# Patient Record
Sex: Female | Born: 1955 | Hispanic: No | Smoking: Current some day smoker
Health system: Southern US, Community
[De-identification: ages and names within clinical notes are randomized; demographics above are authoritative.]

## PROBLEM LIST (undated history)

## (undated) DIAGNOSIS — R002 Palpitations: Secondary | ICD-10-CM

## (undated) DIAGNOSIS — F32A Depression, unspecified: Secondary | ICD-10-CM

## (undated) DIAGNOSIS — F419 Anxiety disorder, unspecified: Secondary | ICD-10-CM

## (undated) DIAGNOSIS — F329 Major depressive disorder, single episode, unspecified: Secondary | ICD-10-CM

## (undated) DIAGNOSIS — I499 Cardiac arrhythmia, unspecified: Secondary | ICD-10-CM

## (undated) DIAGNOSIS — R296 Repeated falls: Secondary | ICD-10-CM

## (undated) DIAGNOSIS — G459 Transient cerebral ischemic attack, unspecified: Secondary | ICD-10-CM

## (undated) DIAGNOSIS — M1990 Unspecified osteoarthritis, unspecified site: Secondary | ICD-10-CM

## (undated) DIAGNOSIS — B029 Zoster without complications: Secondary | ICD-10-CM

## (undated) DIAGNOSIS — E039 Hypothyroidism, unspecified: Secondary | ICD-10-CM

## (undated) HISTORY — PX: CHOLECYSTECTOMY: SHX55

## (undated) HISTORY — PX: ABDOMINAL HYSTERECTOMY: SHX81

## (undated) HISTORY — DX: Cardiac arrhythmia, unspecified: I49.9

## (undated) HISTORY — DX: Transient cerebral ischemic attack, unspecified: G45.9

## (undated) HISTORY — DX: Repeated falls: R29.6

---

## 2016-07-09 ENCOUNTER — Ambulatory Visit: Payer: Self-pay | Admitting: Family

## 2016-07-10 ENCOUNTER — Ambulatory Visit: Payer: Self-pay | Admitting: Family

## 2016-07-17 ENCOUNTER — Ambulatory Visit: Payer: Self-pay | Admitting: Family

## 2016-07-17 DIAGNOSIS — Z0289 Encounter for other administrative examinations: Secondary | ICD-10-CM

## 2016-09-05 ENCOUNTER — Encounter: Payer: Self-pay | Admitting: *Deleted

## 2016-09-05 ENCOUNTER — Emergency Department: Payer: Medicare (Managed Care)

## 2016-09-05 ENCOUNTER — Emergency Department
Admission: EM | Admit: 2016-09-05 | Discharge: 2016-09-05 | Disposition: A | Discharge status: Home / Self Care | Payer: Medicare (Managed Care) | Attending: Emergency Medicine | Admitting: Emergency Medicine

## 2016-09-05 DIAGNOSIS — F17200 Nicotine dependence, unspecified, uncomplicated: Secondary | ICD-10-CM | POA: Insufficient documentation

## 2016-09-05 DIAGNOSIS — Y9389 Activity, other specified: Secondary | ICD-10-CM | POA: Insufficient documentation

## 2016-09-05 DIAGNOSIS — S0990XA Unspecified injury of head, initial encounter: Secondary | ICD-10-CM | POA: Diagnosis present

## 2016-09-05 DIAGNOSIS — Y92009 Unspecified place in unspecified non-institutional (private) residence as the place of occurrence of the external cause: Secondary | ICD-10-CM | POA: Insufficient documentation

## 2016-09-05 DIAGNOSIS — S300XXA Contusion of lower back and pelvis, initial encounter: Secondary | ICD-10-CM

## 2016-09-05 DIAGNOSIS — S060X1A Concussion with loss of consciousness of 30 minutes or less, initial encounter: Secondary | ICD-10-CM | POA: Diagnosis not present

## 2016-09-05 DIAGNOSIS — W108XXA Fall (on) (from) other stairs and steps, initial encounter: Secondary | ICD-10-CM | POA: Insufficient documentation

## 2016-09-05 DIAGNOSIS — W19XXXA Unspecified fall, initial encounter: Secondary | ICD-10-CM

## 2016-09-05 DIAGNOSIS — Y999 Unspecified external cause status: Secondary | ICD-10-CM | POA: Insufficient documentation

## 2016-09-05 DIAGNOSIS — S5001XA Contusion of right elbow, initial encounter: Secondary | ICD-10-CM

## 2016-09-05 HISTORY — DX: Anxiety disorder, unspecified: F41.9

## 2016-09-05 HISTORY — DX: Palpitations: R00.2

## 2016-09-05 MED ORDER — DIPHENHYDRAMINE HCL 50 MG/ML IJ SOLN
25.0000 mg | Freq: Once | INTRAMUSCULAR | Status: AC
  Administered 2016-09-05: 25 mg via INTRAMUSCULAR
  Filled 2016-09-05: qty 1

## 2016-09-05 MED ORDER — PROMETHAZINE HCL 25 MG/ML IJ SOLN
12.5000 mg | Freq: Once | INTRAMUSCULAR | Status: AC
  Administered 2016-09-05: 12.5 mg via INTRAMUSCULAR
  Filled 2016-09-05: qty 1

## 2016-09-05 MED ORDER — PREDNISONE 20 MG PO TABS
60.0000 mg | ORAL_TABLET | Freq: Once | ORAL | Status: AC
  Administered 2016-09-05: 60 mg via ORAL
  Filled 2016-09-05: qty 3

## 2016-09-05 NOTE — ED Triage Notes (Addendum)
Pt states she had slippery socks on and slipped today and hit her head, states headache and back pain, denies any LOC, denies any use of blood thinners, pt very anxious in triage states hx of anxiety,

## 2016-09-05 NOTE — ED Provider Notes (Signed)
[facility] Emergency Department Provider Note  ____________________________________________  Time seen: Approximately 8:21 PM  I have reviewed the triage vital signs and the nursing notes.   HISTORY  Chief Complaint Fall    HPI Diana West is a 60 y.o. female presents emergency department status post a fall at home. Patient states that she was coming down [place] stairs when she slipped with her socks and fell landing on her lower back and hitting her head. Patient states that she had her head with enough force to temporarily lose consciousness. Patient states that she now has a headache, some mild blurry vision, some mild tinnitus. Patient is also endorsing neck, right elbow, lower back pain. 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 She has chronic neck and back pain. Patient denies any radicular symptoms in any 4 extremity. She denies any bowel or bladder function, saddle anesthesia, paresthesias. Patient reports that pain is sharp in her neck, elbow, back but she is more concerned about her head and her headache. No emesis. No nausea. No medications prior to arrival.   Past Medical History:  Diagnosis Date  . Anxiety   . Palpitation     There are no active problems to display for this patient.   History reviewed. No pertinent surgical history.  Prior to Admission medications   Not on File    Allergies Codeine and Toradol [ketorolac tromethamine]  History reviewed. No pertinent family history.  Social History Social History  Substance Use Topics  . Smoking status: Current Some Day Smoker  . Smokeless tobacco: Not on file  . Alcohol use Not on file     Review of Systems  Constitutional: No fever/chills Eyes: Positive for blurry vision. No discharge ENT: No upper respiratory complaints. Cardiovascular: no chest pain. Respiratory: no cough. No SOB. Gastrointestinal: No abdominal pain.  No  nausea, no vomiting.  No diarrhea.  No constipation. Musculoskeletal: Positive for neck, right elbow, lower back pain. She denies any solid anesthesia, paresthesias, bowel or bladder dysfunction. Skin: Negative for rash, abrasions, lacerations, ecchymosis. Neurological: Positive for headache but denies focal weakness or numbness. 10-point ROS otherwise negative.  ____________________________________________   PHYSICAL EXAM:  VITAL SIGNS: ED Triage Vitals [09/05/16 1756]  Enc Vitals Group     BP 132/89     Pulse Rate 98     Resp 18     Temp 98.8 F (37.1 C)     Temp Source Oral     SpO2 100 %     Weight 130 lb (59 kg)     Height 5\' 7"  (1.702 m)     Head Circumference      Peak Flow      Pain Score 8     Pain Loc      Pain Edu?      Excl. in [place]?      Constitutional: Alert and oriented. Well appearing and in no acute distress. Eyes: Conjunctivae are normal. PERRL. EOMI. Funduscopic exam reveals no acute abnormalities bilaterally. Head: Atraumatic, no evidence of hematoma, abrasion, laceration. Patient is nontender to palpation of the osseous structures of the skull and face. No battle signs. No raccoon eyes. There is serosanguineous fluid drainage from the ears or nares. ENT:      Ears:       Nose: No congestion/rhinnorhea.      Mouth/Throat: Mucous membranes are moist.  Neck: No stridor.  Mild multilevel midline cervical spine tenderness to palpation. No step-off. No  point tenderness.  Cardiovascular: Normal rate, regular rhythm. Normal S1 and S2.  Good peripheral circulation. Respiratory: Normal respiratory effort without tachypnea or retractions. Lungs CTAB. Good air entry to the bases with no decreased or absent breath sounds. Gastrointestinal: Bowel sounds 4 quadrants. Soft and nontender to palpation. No guarding or rigidity. No palpable masses. No distention. Musculoskeletal: Full range of motion to all extremities. No gross deformities appreciated. No deformities or  gross edema noted to right elbow upon inspection. Mild ecchymosis noted to the posterior elbow. Patient is nontender to palpation over the posterior elbow but no specific point tenderness. No palpable abnormality. Full range of motion to elbow. Radial pulse and sensation intact distally. No visible deformities or ecchymosis noted to lumbar spine upon inspection. Patient is diffusely tender palpation midline spinal processes. No step-off. No palpable bladder moderate. Patient is nontender to palpation bilateral sciatic notches. Dorsalis pedis pulse intact bilateral lower extremities. Sensation intact and equal lower extremities. Neurologic:  Normal speech and language. No gross focal neurologic deficits are appreciated. Radial nerves II through XII are grossly intact. Skin:  Skin is warm, dry and intact. No rash noted. Psychiatric: Mood and affect are normal. She does appear anxious. Speech and behavior are normal. Patient exhibits appropriate insight and judgement.   ____________________________________________   LABS (all labs ordered are listed, but only abnormal results are displayed)  Labs Reviewed - No data to display ____________________________________________  EKG   ____________________________________________  RADIOLOGY Diamantina [facility] Genora Arp, personally viewed and evaluated these images (plain radiographs) as part of my medical decision making, as well as reviewing the written report by the radiologist.  Dg Lumbar Spine Complete  Result Date: 09/05/2016 CLINICAL DATA:  Acute lumbar spine pain following fall today. Initial encounter. EXAM: LUMBAR SPINE - COMPLETE 4+ VIEW COMPARISON:  None. FINDINGS: There is no evidence of acute fracture or subluxation. Multilevel mild degenerative disc disease and mild to moderate facet arthropathy noted. No focal bony lesions or spondylolysis identified. Abdominal aortic atherosclerotic calcifications are present. IMPRESSION: No evidence of acute  abnormality. Multilevel degenerative changes as described. Abdominal aortic atherosclerosis. Electronically Signed   By: Margarette Canada M.D.   On: 09/05/2016 19:36   Dg Elbow Complete Right  Result Date: 09/05/2016 CLINICAL DATA:  Acute right elbow pain following fall today. Initial encounter. EXAM: RIGHT ELBOW - COMPLETE 3+ VIEW COMPARISON:  None. FINDINGS: There is no evidence of fracture, dislocation, or joint effusion. There is no evidence of arthropathy or other focal bone abnormality. Soft tissues are unremarkable. IMPRESSION: Negative. Electronically Signed   By: Margarette Canada M.D.   On: 09/05/2016 19:34   Ct Head Wo Contrast  Result Date: 09/05/2016 CLINICAL DATA:  Mechanical fall with posterior head injury. Headache and blurry vision. Neck pain. Initial encounter. EXAM: CT HEAD WITHOUT CONTRAST CT CERVICAL SPINE WITHOUT CONTRAST TECHNIQUE: Multidetector CT imaging of the head and cervical spine was performed following the standard protocol without intravenous contrast. Multiplanar CT image reconstructions of the cervical spine were also generated. COMPARISON:  None. FINDINGS: CT HEAD FINDINGS Brain: No evidence of acute infarction, hemorrhage, hydrocephalus, or mass. Mild prominence of extra-axial spaces without discrete collection or definite mass-effect to suggest hygroma. Vascular: Atherosclerotic calcification. Skull: Negative for fracture Sinuses/Orbits: No acute finding CT CERVICAL SPINE FINDINGS Alignment: Reversal cervical lordosis from degenerative changes. Degenerative appearing C5-6 retrolisthesis. Facet mediated C7-T1 anterolisthesis. Skull base and vertebrae: Negative for acute fracture. Soft tissues and spinal canal: No gross canal hematoma or prevertebral edema. There is fatty atrophy  of both parotid glands with numerous calcifications. Atrophic thyroid. Disc levels: Degenerative changes with narrowing and endplate spurring greatest at C4-5 and C5-6. Generalized facet arthropathy with  bulky upper cervical facet spurring and C3-4 ankylosis. Generalized spinal stenosis with cord compression at C3-4 and C4-5 due to disc protrusions. Upper chest: No acute finding IMPRESSION: 1. No evidence of acute intracranial or cervical spine fracture. 2. Cervical spine degeneration with spinal stenosis and cord flattening at C4-5. Electronically Signed   By: Monte Fantasia M.D.   On: 09/05/2016 19:18   Ct Cervical Spine Wo Contrast  Result Date: 09/05/2016 CLINICAL DATA:  Mechanical fall with posterior head injury. Headache and blurry vision. Neck pain. Initial encounter. EXAM: CT HEAD WITHOUT CONTRAST CT CERVICAL SPINE WITHOUT CONTRAST TECHNIQUE: Multidetector CT imaging of the head and cervical spine was performed following the standard protocol without intravenous contrast. Multiplanar CT image reconstructions of the cervical spine were also generated. COMPARISON:  None. FINDINGS: CT HEAD FINDINGS Brain: No evidence of acute infarction, hemorrhage, hydrocephalus, or mass. Mild prominence of extra-axial spaces without discrete collection or definite mass-effect to suggest hygroma. Vascular: Atherosclerotic calcification. Skull: Negative for fracture Sinuses/Orbits: No acute finding CT CERVICAL SPINE FINDINGS Alignment: Reversal cervical lordosis from degenerative changes. Degenerative appearing C5-6 retrolisthesis. Facet mediated C7-T1 anterolisthesis. Skull base and vertebrae: Negative for acute fracture. Soft tissues and spinal canal: No gross canal hematoma or prevertebral edema. There is fatty atrophy of both parotid glands with numerous calcifications. Atrophic thyroid. Disc levels: Degenerative changes with narrowing and endplate spurring greatest at C4-5 and C5-6. Generalized facet arthropathy with bulky upper cervical facet spurring and C3-4 ankylosis. Generalized spinal stenosis with cord compression at C3-4 and C4-5 due to disc protrusions. Upper chest: No acute finding IMPRESSION: 1. No  evidence of acute intracranial or cervical spine fracture. 2. Cervical spine degeneration with spinal stenosis and cord flattening at C4-5. Electronically Signed   By: Monte Fantasia M.D.   On: 09/05/2016 19:18    ____________________________________________    PROCEDURES  Procedure(s) performed:    Procedures    Medications  promethazine (PHENERGAN) injection 12.5 mg (12.5 mg Intramuscular Given 09/05/16 2020)  predniSONE (DELTASONE) tablet 60 mg (60 mg Oral Given 09/05/16 2019)  diphenhydrAMINE (BENADRYL) injection 25 mg (25 mg Intramuscular Given 09/05/16 2019)     ____________________________________________   INITIAL IMPRESSION / ASSESSMENT AND PLAN / ED COURSE  Pertinent labs & imaging results that were available during my care of the patient were reviewed by me and considered in my medical decision making (see chart for details).  Review of the [place] CSRS was performed in accordance of the [place] prior to dispensing any controlled drugs.  Clinical Course     Patient's diagnosis is consistent with Fall resulting in mild head injury and concussion symptoms with mild memory loss, loss consciousness, continue headache, mild blurred vision. Cranial nerves are still intact. No indication for further imaging. CT scan does reveal cervical stenosis the patient denies any radicular symptoms. Patient reports having diffuse degenerative changes throughout spine. X-rays of the elbow and lumbar spine are reassuring for no acute abnormality. Patient's exam is reassuring at this time and patient will be discharged. She is given migraine cocktail medications with the exception of prednisone substituted for NSAID injection.. Patient is to take Tylenol and Motrin at home for any pain complaints. Patient is to follow up with primary care as needed or otherwise directed. Patient is given ED precautions to return to the ED for any worsening or new  symptoms.     ____________________________________________  FINAL CLINICAL IMPRESSION(S) / ED DIAGNOSES  Final diagnoses:  Fall, initial encounter  Minor head injury, initial encounter  Lumbar contusion, initial encounter  Contusion of right elbow, initial encounter  Concussion with loss of consciousness of 30 minutes or less, initial encounter      NEW MEDICATIONS STARTED DURING THIS VISIT:  There are no discharge medications for this patient.       This chart was dictated using voice recognition software/Dragon. Despite best efforts to proofread, errors can occur which can change the meaning. Any change was purely unintentional.    Darletta Moll, PA-C 09/05/16 2106    Hinda Kehr, MD 09/05/16 2122

## 2016-09-05 NOTE — ED Notes (Signed)
Reviewed d/c instructions, follow-up care, and OTC pain meds with pt. Pt verbalized understanding

## 2016-09-05 NOTE — ED Notes (Signed)
Pt fell down 4 steps inside home today.  States sock slipped on stairs.  Pt states a gate fell on her.   Pt has a headache.  No loc.  No vomiting. Pt also has lower back pain and neck pain.  Pt has abrasions to arms.  Pt alert.  Speech clear.  Pt also has ringing in ears.

## 2017-05-02 ENCOUNTER — Emergency Department: Payer: Medicare (Managed Care)

## 2017-05-02 ENCOUNTER — Emergency Department
Admission: EM | Admit: 2017-05-02 | Discharge: 2017-05-02 | Disposition: A | Discharge status: Home / Self Care | Payer: Medicare (Managed Care) | Attending: Emergency Medicine | Admitting: Emergency Medicine

## 2017-05-02 ENCOUNTER — Encounter: Payer: Self-pay | Admitting: Emergency Medicine

## 2017-05-02 DIAGNOSIS — W19XXXA Unspecified fall, initial encounter: Secondary | ICD-10-CM

## 2017-05-02 DIAGNOSIS — Y9301 Activity, walking, marching and hiking: Secondary | ICD-10-CM | POA: Diagnosis not present

## 2017-05-02 DIAGNOSIS — W010XXA Fall on same level from slipping, tripping and stumbling without subsequent striking against object, initial encounter: Secondary | ICD-10-CM | POA: Diagnosis not present

## 2017-05-02 DIAGNOSIS — Y999 Unspecified external cause status: Secondary | ICD-10-CM | POA: Diagnosis not present

## 2017-05-02 DIAGNOSIS — F17200 Nicotine dependence, unspecified, uncomplicated: Secondary | ICD-10-CM | POA: Insufficient documentation

## 2017-05-02 DIAGNOSIS — S0990XA Unspecified injury of head, initial encounter: Secondary | ICD-10-CM | POA: Diagnosis present

## 2017-05-02 DIAGNOSIS — Y92038 Other place in apartment as the place of occurrence of the external cause: Secondary | ICD-10-CM | POA: Diagnosis not present

## 2017-05-02 DIAGNOSIS — S0083XA Contusion of other part of head, initial encounter: Secondary | ICD-10-CM

## 2017-05-02 DIAGNOSIS — S0011XA Contusion of right eyelid and periocular area, initial encounter: Secondary | ICD-10-CM | POA: Diagnosis not present

## 2017-05-02 MED ORDER — OXYCODONE-ACETAMINOPHEN 5-325 MG PO TABS: 1.0000 | ORAL_TABLET | Freq: Four times a day (QID) | ORAL | 0 refills | Status: DC | PRN

## 2017-05-02 MED ORDER — FENTANYL CITRATE (PF) 100 MCG/2ML IJ SOLN
50.0000 ug | Freq: Once | INTRAMUSCULAR | Status: AC
Start: 2017-05-02 — End: 2017-05-02
  Administered 2017-05-02: 50 ug via INTRAVENOUS

## 2017-05-02 MED ORDER — HYDROMORPHONE HCL 1 MG/ML IJ SOLN
1.0000 mg | Freq: Once | INTRAMUSCULAR | Status: AC
  Administered 2017-05-02: 1 mg via INTRAVENOUS
  Filled 2017-05-02: qty 1

## 2017-05-02 MED ORDER — LORAZEPAM 2 MG/ML IJ SOLN
0.5000 mg | Freq: Once | INTRAMUSCULAR | Status: AC
  Administered 2017-05-02: 0.5 mg via INTRAVENOUS
  Filled 2017-05-02: qty 1

## 2017-05-02 MED ORDER — FENTANYL CITRATE (PF) 100 MCG/2ML IJ SOLN
INTRAMUSCULAR | Status: DC
Start: 2017-05-02 — End: 2017-05-02
  Filled 2017-05-02: qty 2

## 2017-05-02 MED ORDER — FENTANYL CITRATE (PF) 100 MCG/2ML IJ SOLN: 50.0000 ug | Freq: Once | INTRAMUSCULAR | Status: DC

## 2017-05-02 NOTE — ED Notes (Signed)
Pt verbalized understanding of discharge instructions. NAD at this time. 

## 2017-05-02 NOTE — Discharge Instructions (Signed)
No results found for this or any previous visit. Ct Head Wo Contrast  Result Date: 05/02/2017 CLINICAL DATA:  Recent fall EXAM: CT HEAD WITHOUT CONTRAST CT MAXILLOFACIAL WITHOUT CONTRAST TECHNIQUE: Multidetector CT imaging of the head and maxillofacial structures were performed using the standard protocol without intravenous contrast. Multiplanar CT image reconstructions of the maxillofacial structures were also generated. COMPARISON:  None. FINDINGS: CT HEAD FINDINGS Brain: Mild atrophic changes are noted. No findings to suggest acute hemorrhage, acute infarction or space-occupying mass lesion are noted. Vascular: No hyperdense vessel or unexpected calcification. Skull: Normal. Negative for fracture or focal lesion. Other: Soft tissue swelling is noted over the right orbit with focal soft tissue hematoma. CT MAXILLOFACIAL FINDINGS Osseous: No acute bony abnormality is noted. Orbits: The orbits and their contents are within normal limits. A soft tissue hematoma is noted on the right involving the supraorbital region and right eyelid. Sinuses: Clear. Soft tissues: As described above. No other soft tissue abnormality is noted. IMPRESSION: CT of the head: Mild atrophic changes without acute abnormality. CT of the maxillofacial bones: No acute fracture is seen. Right periorbital soft tissue changes are noted as described. Electronically Signed   By: Inez Catalina M.D.   On: 05/02/2017 17:23   Ct Maxillofacial Wo Contrast  Result Date: 05/02/2017 CLINICAL DATA:  Recent fall EXAM: CT HEAD WITHOUT CONTRAST CT MAXILLOFACIAL WITHOUT CONTRAST TECHNIQUE: Multidetector CT imaging of the head and maxillofacial structures were performed using the standard protocol without intravenous contrast. Multiplanar CT image reconstructions of the maxillofacial structures were also generated. COMPARISON:  None. FINDINGS: CT HEAD FINDINGS Brain: Mild atrophic changes are noted. No findings to suggest acute hemorrhage, acute infarction  or space-occupying mass lesion are noted. Vascular: No hyperdense vessel or unexpected calcification. Skull: Normal. Negative for fracture or focal lesion. Other: Soft tissue swelling is noted over the right orbit with focal soft tissue hematoma. CT MAXILLOFACIAL FINDINGS Osseous: No acute bony abnormality is noted. Orbits: The orbits and their contents are within normal limits. A soft tissue hematoma is noted on the right involving the supraorbital region and right eyelid. Sinuses: Clear. Soft tissues: As described above. No other soft tissue abnormality is noted. IMPRESSION: CT of the head: Mild atrophic changes without acute abnormality. CT of the maxillofacial bones: No acute fracture is seen. Right periorbital soft tissue changes are noted as described. Electronically Signed   By: Inez Catalina M.D.   On: 05/02/2017 17:23

## 2017-05-02 NOTE — ED Provider Notes (Addendum)
[facility] Emergency Department Provider Note  ____________________________________________  Time seen: Approximately 6:12 PM  I have reviewed the triage vital signs and the nursing notes.   HISTORY  Chief Complaint Fall    HPI Diana West is a 61 y.o. female who complains of pain on the right forehead after a fall. She just got down to her apartment building's office to renew her lease, and after leaving there she had a brief episode of dizziness which caused her to lose her balance and fell forward striking her face. No loss of consciousness or neck pain. No vision changes. She reports the dizziness it happens frequently for her and is not abnormal and she otherwise feels like she is in her usual state of health. Eating and drinking normally, no recent illness. No vomiting or diarrhea.Denies neck pain paresthesia or weakness.     Past Medical History:  Diagnosis Date  . Anxiety   . Palpitation      There are no active problems to display for this patient.    History reviewed. No pertinent surgical history.   Prior to Admission medications   Not on File  Gabapentin   Allergies Codeine and Toradol [ketorolac tromethamine]   History reviewed. No pertinent family history.  Social History Social History  Substance Use Topics  . Smoking status: Current Some Day Smoker  . Smokeless tobacco: Never Used  . Alcohol use No    Review of Systems  Constitutional:   No fever or chills.  ENT:   No sore throat. No rhinorrhea. Cardiovascular:   No chest pain or syncope. Respiratory:   No dyspnea or cough. Gastrointestinal:   Negative for abdominal pain, vomiting and diarrhea.  Musculoskeletal:   Negative for focal pain or swelling All other systems reviewed and are negative except as documented above in ROS and HPI.  ____________________________________________   PHYSICAL EXAM:  VITAL SIGNS: ED Triage Vitals  Enc Vitals Group     BP  05/02/17 1627 (!) 104/57     Pulse Rate 05/02/17 1627 95     Resp 05/02/17 1627 19     Temp --      Temp src --      SpO2 05/02/17 1627 98 %     Weight 05/02/17 1629 125 lb (56.7 kg)     Height 05/02/17 1629 5\' 7"  (1.702 m)     Head Circumference --      Peak Flow --      Pain Score 05/02/17 1629 10     Pain Loc --      Pain Edu? --      Excl. in [place]? --     Vital signs reviewed, nursing assessments reviewed.   Constitutional:   Alert and oriented. Well appearing and in no distress. Eyes:   No scleral icterus.  EOMI. No nystagmus. No conjunctival pallor. PERRL. ENT   Head:   Normocephalic With focal hematoma over the right lateral supraorbital ridge. No laceration or bleeding. No hemotympanum.   Nose:   No congestion/rhinnorhea. No epistaxis   Mouth/Throat:   MMM, no pharyngeal erythema. No peritonsillar mass.    Neck:   No meningismus. Full ROM. No midline tenderness. Hematological/Lymphatic/Immunilogical:   No cervical lymphadenopathy. Cardiovascular:   RRR. Symmetric bilateral radial and DP pulses.  No murmurs.  Respiratory:   Normal respiratory effort without tachypnea/retractions. Breath sounds are clear and equal bilaterally. No wheezes/rales/rhonchi. Gastrointestinal:   Soft and nontender. Non distended. There is no CVA tenderness.  No rebound, rigidity, or guarding. Genitourinary:   deferred Musculoskeletal:   Normal range of motion in all extremities. No joint effusions.  No lower extremity tenderness.  No edema. No midline spinal tenderness. Pelvis stable. No clavicular or shoulder pain or deformity Neurologic:   Normal speech and language.  Motor grossly intact. No gross focal neurologic deficits are appreciated.  Skin:    Skin is warm, dry and intact. No rash noted.  No petechiae, purpura, or bullae.  ____________________________________________    LABS (pertinent positives/negatives) (all labs ordered are listed, but only abnormal results are  displayed) Labs Reviewed - No data to display ____________________________________________   EKG  Interpreted by me Sinus rhythm rate of 90, normal axis intervals QRS ST segments and T waves.  ____________________________________________    RADIOLOGY  Ct Head Wo Contrast  Result Date: 05/02/2017 CLINICAL DATA:  Recent fall EXAM: CT HEAD WITHOUT CONTRAST CT MAXILLOFACIAL WITHOUT CONTRAST TECHNIQUE: Multidetector CT imaging of the head and maxillofacial structures were performed using the standard protocol without intravenous contrast. Multiplanar CT image reconstructions of the maxillofacial structures were also generated. COMPARISON:  None. FINDINGS: CT HEAD FINDINGS Brain: Mild atrophic changes are noted. No findings to suggest acute hemorrhage, acute infarction or space-occupying mass lesion are noted. Vascular: No hyperdense vessel or unexpected calcification. Skull: Normal. Negative for fracture or focal lesion. Other: Soft tissue swelling is noted over the right orbit with focal soft tissue hematoma. CT MAXILLOFACIAL FINDINGS Osseous: No acute bony abnormality is noted. Orbits: The orbits and their contents are within normal limits. A soft tissue hematoma is noted on the right involving the supraorbital region and right eyelid. Sinuses: Clear. Soft tissues: As described above. No other soft tissue abnormality is noted. IMPRESSION: CT of the head: Mild atrophic changes without acute abnormality. CT of the maxillofacial bones: No acute fracture is seen. Right periorbital soft tissue changes are noted as described. Electronically Signed   By: Inez Catalina M.D.   On: 05/02/2017 17:23   Ct Maxillofacial Wo Contrast  Result Date: 05/02/2017 CLINICAL DATA:  Recent fall EXAM: CT HEAD WITHOUT CONTRAST CT MAXILLOFACIAL WITHOUT CONTRAST TECHNIQUE: Multidetector CT imaging of the head and maxillofacial structures were performed using the standard protocol without intravenous contrast. Multiplanar CT  image reconstructions of the maxillofacial structures were also generated. COMPARISON:  None. FINDINGS: CT HEAD FINDINGS Brain: Mild atrophic changes are noted. No findings to suggest acute hemorrhage, acute infarction or space-occupying mass lesion are noted. Vascular: No hyperdense vessel or unexpected calcification. Skull: Normal. Negative for fracture or focal lesion. Other: Soft tissue swelling is noted over the right orbit with focal soft tissue hematoma. CT MAXILLOFACIAL FINDINGS Osseous: No acute bony abnormality is noted. Orbits: The orbits and their contents are within normal limits. A soft tissue hematoma is noted on the right involving the supraorbital region and right eyelid. Sinuses: Clear. Soft tissues: As described above. No other soft tissue abnormality is noted. IMPRESSION: CT of the head: Mild atrophic changes without acute abnormality. CT of the maxillofacial bones: No acute fracture is seen. Right periorbital soft tissue changes are noted as described. Electronically Signed   By: Inez Catalina M.D.   On: 05/02/2017 17:23    ____________________________________________   PROCEDURES Procedures  ____________________________________________   INITIAL IMPRESSION / ASSESSMENT AND PLAN / ED COURSE  Pertinent labs & imaging results that were available during my care of the patient were reviewed by me and considered in my medical decision making (see chart for details).  Patient well  appearing no acute distress, presents with hematoma over the right eye after a brief episode of dizziness causing a fall. The patient states that these episodes are typical for her and happen frequently. She is otherwise in her usual state of health, eating and drinking normally, without any other acute pain syndrome or other complaints. She otherwise feels back to normal other than the pain at the site of her hematoma. Tetanus is up to date. CT head negative, EKG nondiagnostic, follow-up with primary  care.  Patient refuses NSAIDs, refuses Percocet due to unspecified codeine allergy. Recommend Tylenol.      ----------------------------------------- 7:28 PM on 05/02/2017 -----------------------------------------  On discharge, patient remembers that she has taken Percocet before and is okay with that and does not have adverse effects. We'll give her a limited prescription. She is aware of precautions and to avoid driving or dangerous activities while taking it.  ____________________________________________   FINAL CLINICAL IMPRESSION(S) / ED DIAGNOSES  Final diagnoses:  Fall, initial encounter  Contusion of face, initial encounter      New Prescriptions   No medications on file     Portions of this note were generated with dragon dictation software. Dictation errors may occur despite best attempts at proofreading.    Carrie Mew, MD 05/02/17 1816    Carrie Mew, MD 05/02/17 Kathyrn Drown

## 2017-05-02 NOTE — ED Triage Notes (Signed)
Pt to ED by EMS from home after a fall in which the pt felt dizzy lost her balance and fell forward hitting her face, right shoulder and right leg. Pt has large hematoma over right eye.

## 2017-05-02 NOTE — ED Notes (Signed)

## 2017-05-05 ENCOUNTER — Emergency Department: Payer: Medicare (Managed Care)

## 2017-05-05 ENCOUNTER — Encounter: Payer: Self-pay | Admitting: Emergency Medicine

## 2017-05-05 ENCOUNTER — Emergency Department
Admission: EM | Admit: 2017-05-05 | Discharge: 2017-05-05 | Disposition: A | Discharge status: Home / Self Care | Payer: Medicare (Managed Care) | Attending: Emergency Medicine | Admitting: Emergency Medicine

## 2017-05-05 DIAGNOSIS — Y92000 Kitchen of unspecified non-institutional (private) residence as  the place of occurrence of the external cause: Secondary | ICD-10-CM | POA: Diagnosis not present

## 2017-05-05 DIAGNOSIS — Y999 Unspecified external cause status: Secondary | ICD-10-CM | POA: Diagnosis not present

## 2017-05-05 DIAGNOSIS — M542 Cervicalgia: Secondary | ICD-10-CM | POA: Diagnosis not present

## 2017-05-05 DIAGNOSIS — R42 Dizziness and giddiness: Secondary | ICD-10-CM | POA: Diagnosis present

## 2017-05-05 DIAGNOSIS — W19XXXA Unspecified fall, initial encounter: Secondary | ICD-10-CM

## 2017-05-05 DIAGNOSIS — Y939 Activity, unspecified: Secondary | ICD-10-CM | POA: Diagnosis not present

## 2017-05-05 DIAGNOSIS — S40011A Contusion of right shoulder, initial encounter: Secondary | ICD-10-CM | POA: Insufficient documentation

## 2017-05-05 DIAGNOSIS — W010XXA Fall on same level from slipping, tripping and stumbling without subsequent striking against object, initial encounter: Secondary | ICD-10-CM | POA: Diagnosis not present

## 2017-05-05 DIAGNOSIS — M545 Low back pain, unspecified: Secondary | ICD-10-CM

## 2017-05-05 DIAGNOSIS — E039 Hypothyroidism, unspecified: Secondary | ICD-10-CM | POA: Diagnosis not present

## 2017-05-05 LAB — COMPREHENSIVE METABOLIC PANEL
ALT: 11 U/L — AB (ref 14–54)
ANION GAP: 5 (ref 5–15)
AST: 19 U/L (ref 15–41)
Albumin: 4 g/dL (ref 3.5–5.0)
Alkaline Phosphatase: 87 U/L (ref 38–126)
BUN: 22 mg/dL — ABNORMAL HIGH (ref 6–20)
CALCIUM: 9 mg/dL (ref 8.9–10.3)
CO2: 23 mmol/L (ref 22–32)
Chloride: 109 mmol/L (ref 101–111)
Creatinine, Ser: 1.16 mg/dL — ABNORMAL HIGH (ref 0.44–1.00)
GFR calc Af Amer: 58 mL/min — ABNORMAL LOW (ref >60)
GFR, EST NON AFRICAN AMERICAN: 50 mL/min — AB (ref >60)
GLUCOSE: 89 mg/dL (ref 65–99)
POTASSIUM: 3.7 mmol/L (ref 3.5–5.1)
SODIUM: 137 mmol/L (ref 135–145)
TOTAL PROTEIN: 7.5 g/dL (ref 6.5–8.1)
Total Bilirubin: 0.4 mg/dL (ref 0.3–1.2)

## 2017-05-05 LAB — URINALYSIS, COMPLETE (UACMP) WITH MICROSCOPIC
BACTERIA UA: NONE SEEN
Bilirubin Urine: NEGATIVE
GLUCOSE, UA: NEGATIVE mg/dL
HGB URINE DIPSTICK: NEGATIVE
KETONES UR: NEGATIVE mg/dL
NITRITE: NEGATIVE
PROTEIN: 30 mg/dL — AB
SPECIFIC GRAVITY, URINE: 1.017 (ref 1.005–1.030)
pH: 5 (ref 5.0–8.0)

## 2017-05-05 LAB — CBC
HCT: 31 % — ABNORMAL LOW (ref 35.0–47.0)
Hemoglobin: 10.2 g/dL — ABNORMAL LOW (ref 12.0–16.0)
MCH: 28.2 pg (ref 26.0–34.0)
MCHC: 32.9 g/dL (ref 32.0–36.0)
MCV: 85.8 fL (ref 80.0–100.0)
PLATELETS: 221 10*3/uL (ref 150–440)
RBC: 3.61 MIL/uL — AB (ref 3.80–5.20)
RDW: 16.1 % — ABNORMAL HIGH (ref 11.5–14.5)
WBC: 7.2 10*3/uL (ref 3.6–11.0)

## 2017-05-05 LAB — TROPONIN I

## 2017-05-05 LAB — URINE DRUG SCREEN, QUALITATIVE (ARMC ONLY)
AMPHETAMINES, UR SCREEN: NOT DETECTED
BENZODIAZEPINE, UR SCRN: POSITIVE — AB
Barbiturates, Ur Screen: NOT DETECTED
CANNABINOID 50 NG, UR ~~LOC~~: NOT DETECTED
Cocaine Metabolite,Ur ~~LOC~~: NOT DETECTED
MDMA (ECSTASY) UR SCREEN: NOT DETECTED
Methadone Scn, Ur: NOT DETECTED
Opiate, Ur Screen: NOT DETECTED
Phencyclidine (PCP) Ur S: NOT DETECTED
TRICYCLIC, UR SCREEN: POSITIVE — AB

## 2017-05-05 LAB — ETHANOL

## 2017-05-05 MED ORDER — SODIUM CHLORIDE 0.9 % IV BOLUS (SEPSIS)
1000.0000 mL | Freq: Once | INTRAVENOUS | Status: AC
  Administered 2017-05-05: 1000 mL via INTRAVENOUS

## 2017-05-05 MED ORDER — OXYCODONE HCL 5 MG PO TABS
5.0000 mg | ORAL_TABLET | Freq: Once | ORAL | Status: AC
  Administered 2017-05-05: 5 mg via ORAL
  Filled 2017-05-05: qty 1

## 2017-05-05 MED ORDER — ACETAMINOPHEN 500 MG PO TABS
1000.0000 mg | ORAL_TABLET | Freq: Once | ORAL | Status: AC
  Administered 2017-05-05: 1000 mg via ORAL
  Filled 2017-05-05: qty 2

## 2017-05-05 MED ORDER — TRAMADOL HCL 50 MG PO TABS: 50.0000 mg | ORAL_TABLET | Freq: Four times a day (QID) | ORAL | 0 refills | Status: DC | PRN

## 2017-05-05 NOTE — ED Notes (Signed)
ED Provider at bedside., reviewing plan of care and discharge instructions.

## 2017-05-05 NOTE — ED Triage Notes (Signed)
States fell 3 days ago. States came here and had CT scan. States she fell at that time because she was dizzy. Denies LOC. States she fell again last night. Denies feeling dizzy with that fall but cannot state why she fell. Again denies LOC. R periorbital and facial ecchymosis. Speech clear. Speaking full sentences. MAE well.

## 2017-05-05 NOTE — ED Provider Notes (Addendum)
[facility] Emergency Department Provider Note  ____________________________________________  Time seen: Approximately 4:45 PM  I have reviewed the triage vital signs and the nursing notes.   HISTORY  Chief Complaint Dizziness; Fall; and Facial Injury   HPI Diana West is a 61 y.o. female with a history of anxiety, depression, hypothyroidism who presents for evaluation of a fall. Patient was seen here 3 days ago for a fall with negative CT head and neck. She reports that today she was in her kitchen getting something out of the refrigerator on both her legs gave out and she fell onto her buttock. She denies preceding palpitations, chest pain, dizziness, headache, or any other symptoms. She is complaining of pain in her buttock that has been mild and constant since the fall. She denies head trauma or LOC. She is not on blood thinners. She is complaining of generalized body pain from her fall 3 days ago.  Past Medical History:  Diagnosis Date  . Anxiety   . Palpitation     There are no active problems to display for this patient.   History reviewed. No pertinent surgical history.  Prior to Admission medications   Medication Sig Start Date End Date Taking? Authorizing Provider  amitriptyline (ELAVIL) 150 MG tablet Take 1 tablet by mouth daily. 05/01/17  Yes [provider]  ciprofloxacin (CIPRO) 500 MG tablet Take 500 mg by mouth 2 (two) times daily. 04/10/17  Yes [provider]  gabapentin (NEURONTIN) 600 MG tablet Take 1 tablet by mouth 3 (three) times daily. 05/01/17  Yes [provider]  levothyroxine (SYNTHROID, LEVOTHROID) 125 MCG tablet Take 1 tablet by mouth daily. 05/01/17  Yes [provider]  sertraline (ZOLOFT) 100 MG tablet Take 2 tablets by mouth daily. 05/01/17  Yes [provider]  ALPRAZolam Duanne Moron) 1 MG tablet Take 1 tablet by mouth 3 (three) times daily as needed. 04/06/17   [provider]  oxyCODONE-acetaminophen (ROXICET) 5-325 MG tablet Take 1 tablet by mouth every 6 (six) hours as needed for severe pain. 05/02/17   Carrie Mew, MD  traMADol (ULTRAM) 50 MG tablet Take 1 tablet (50 mg total) by mouth every 6 (six) hours as needed. 05/05/17 05/05/18  Rudene Re, MD    Allergies Codeine and Toradol [ketorolac tromethamine]  No family history on file.  Social History Social History  Substance Use Topics  . Smoking status: Current Some Day Smoker  . Smokeless tobacco: Never Used  . Alcohol use No    Review of Systems Constitutional: Negative for fever. Eyes: Negative for visual changes. ENT: Negative for facial injury or neck injury Cardiovascular: Negative for chest injury. Respiratory: Negative for shortness of breath. Negative for chest wall injury. Gastrointestinal: Negative for abdominal pain or injury. Genitourinary: Negative for dysuria. Musculoskeletal: + lower back injury, R shoulder pain, R knee pain Skin: Negative for laceration/abrasions. Neurological: Negative for head injury.  ____________________________________________   PHYSICAL EXAM:  VITAL SIGNS: ED Triage Vitals  Enc Vitals Group     BP 05/05/17 1248 106/63     Pulse Rate 05/05/17 1248 72     Resp 05/05/17 1248 18     Temp 05/05/17 1248 97.6 F (36.4 C)     Temp Source 05/05/17 1248 Oral     SpO2 05/05/17 1248 100 %     Weight 05/05/17 1250 125 lb (56.7 kg)     Height 05/05/17 1250 5\' 7"  (1.702 m)     Head Circumference --  Peak Flow --      Pain Score 05/05/17 1247 7     Pain Loc --      Pain Edu? --      Excl. in [place]? --    Constitutional: Alert and oriented. No acute distress. Does not appear intoxicated. HEENT Head: Normocephalic and atraumatic. Face: No facial bony tenderness. Stable midface Ears: No hemotympanum bilaterally. No Battle sign Eyes: No eye injury. PERRL. No raccoon eyes. R periorbital hematoma and echymosis of the R cheek area Nose:  Nontender. No epistaxis. No rhinorrhea Mouth/Throat: Mucous membranes are moist. No oropharyngeal blood. No dental injury. Airway patent without stridor. Normal voice. Neck: no C-collar in place. No midline c-spine tenderness.  Cardiovascular: Normal rate, regular rhythm. Normal and symmetric distal pulses are present in all extremities. Pulmonary/Chest: Chest wall is stable and nontender to palpation/compression. Normal respiratory effort. Breath sounds are normal. No crepitus.  Abdominal: Soft, nontender, non distended. Musculoskeletal: Large bruise over the R shoulder with ttp. Nontender with normal full range of motion in all other extremities. No deformities. No thoracic or lumbar midline spinal tenderness. Pelvis is stable. Skin: Skin is warm, dry and intact. No abrasions or contutions. Psychiatric: Speech and behavior are appropriate. Neurological: Normal speech and language. Moves all extremities to command. No gross focal neurologic deficits are appreciated.  Glascow Coma Score: 4 - Opens eyes on own 6 - Follows simple motor commands 5 - Alert and oriented GCS: 15   ____________________________________________   LABS (all labs ordered are listed, but only abnormal results are displayed)  Labs Reviewed  CBC - Abnormal; Notable for the following:       Result Value   RBC 3.61 (*)    Hemoglobin 10.2 (*)    HCT 31.0 (*)    RDW 16.1 (*)    All other components within normal limits  COMPREHENSIVE METABOLIC PANEL - Abnormal; Notable for the following:    BUN 22 (*)    Creatinine, Ser 1.16 (*)    ALT 11 (*)    GFR calc non Af Amer 50 (*)    GFR calc Af Amer 58 (*)    All other components within normal limits  URINALYSIS, COMPLETE (UACMP) WITH MICROSCOPIC - Abnormal; Notable for the following:    Color, Urine YELLOW (*)    APPearance CLEAR (*)    Protein, ur 30 (*)    Leukocytes, UA TRACE (*)    Squamous Epithelial / LPF 0-5 (*)    All other components within normal limits   URINE DRUG SCREEN, QUALITATIVE ([facility] ONLY) - Abnormal; Notable for the following:    Tricyclic, Ur Screen POSITIVE (*)    Benzodiazepine, Ur Scrn POSITIVE (*)    All other components within normal limits  TROPONIN I  ETHANOL   ____________________________________________  EKG  ED ECG REPORT I, Rudene Re, the attending physician, personally viewed and interpreted this ECG.  Normal sinus rhythm, rate of 73, normal intervals, normal axis, no ST elevations or depressions. Normal EKG.  ____________________________________________  RADIOLOGY  CT head/neck/cspine: Head CT: Negative and normal for age. Maxillofacial CT: No fracture. Superficial soft tissue hematoma right supraorbital region. No evidence of intraorbital injury. Multilevel degenerative changes. No fracture or traumatic malalignment identified.  XR R shoulder: negative  XR R knee: negative  ____________________________________________   PROCEDURES  Procedure(s) performed: None Procedures Critical Care performed:  None ____________________________________________   INITIAL IMPRESSION / ASSESSMENT AND PLAN / ED COURSE   61 y.o. female with a history of  anxiety, depression, hypothyroidism who presents for evaluation of a fall. Patient was seen here 3 days ago for similar complaints. Patient tells me that she's been having on and off episodes of lightheadedness over the course of the last year mostly when she stands up. Although she describes that this time she had a mechanical fall with no preceding symptoms, patient has not had labs done since 2015 to evaluate for these dizzy spells therefore decided to do this time. As I was reviewing patient's medications I realized the patient is on Xanax, gabapentin, and Zoloft. She was also given Percocet 3 days ago. When I told patient that I was concerned about all of these sedating medications being the possible cause of patient's dizziness patient became extremely  agitated and told me that she's been taking her medications for many years and never had any problems with them. She also became extremely upset that she is in pain and she needed opiates for her pain. I explained to her that I wasn't refusing to give her any pain medication or treat her pain and I was just concerned about all the medications that she is on. Review of [place] controlled substance database shows the patient filled her prescription for Percocet yesterday for a total of 8 pills and patient tells me that she has 3 left at home. No other prescriptions for opiates within the last 6 months. She does have periorbital hematoma with bruising down her right cheek but no signs or symptoms of basilar skull fracture. She has a bruise also in her right shoulder. She tells that these bruises were all from her fall 3 days ago. Imaging here shows no acute findings. EKG, blood work, urinalysis, and labs did not show any acute findings. A drug screen positive for tricyclic and benzos. The patient was given Tylenol and oxycodone for her pain. Patient is going to be discharged home at this time with a referral for primary care doctor for further evaluation.     Pertinent labs & imaging results that were available during my care of the patient were reviewed by me and considered in my medical decision making (see chart for details).    ____________________________________________   FINAL CLINICAL IMPRESSION(S) / ED DIAGNOSES  Final diagnoses:  Fall, initial encounter  Neck pain  Acute midline low back pain without sciatica  Traumatic ecchymosis of right shoulder, initial encounter      NEW MEDICATIONS STARTED DURING THIS VISIT:  New Prescriptions   TRAMADOL (ULTRAM) 50 MG TABLET    Take 1 tablet (50 mg total) by mouth every 6 (six) hours as needed.     Note:  This document was prepared using Dragon voice recognition software and may include unintentional dictation errors.    Alfred Levins, [place],  MD 05/05/17 [place], [place], MD 05/05/17 1726

## 2017-05-05 NOTE — Discharge Instructions (Addendum)
You were seen in the emergency department after a fall. Luckily all of your imaging studies did not show any evidence of injuries. Follow-up with you doctor within the next 2-3 days for further evaluation. Sometimes injuries can present at a later time and therefore it is imperative that you return to the emergency room if you have a severe headache, facial droop, neck pain, numbness or weakness of your extremities, slurred speech, difficulty finding words, chest pain, back pain, abdominal pain, or any other new symptoms that were not present during this visit. You may take Tylenol at home for your pain.  Pain control: Take tylenol 1000mg  every 8 hours. Take 50mg  of tramadol every 6 hours for breakthrough pain. If you need the tramadol make sure to take one senokot as well to prevent constipation.  Do not drink alcohol, drive or participate in any other potentially dangerous activities while taking this medication as it may make you sleepy. Do not take this medication with any other sedating medications, either prescription or over-the-counter.

## 2017-05-05 NOTE — ED Notes (Signed)
Patient's neighbor, Larry Sierras brought patient to the ED but is going home.  She states patient can call her for update or ride at [phone].

## 2017-06-18 ENCOUNTER — Telehealth: Payer: Self-pay | Admitting: Family Medicine

## 2017-06-18 NOTE — Telephone Encounter (Signed)
Called pt to schedule for Annual Wellness Visit with Nurse Health Advisor, [place], my c/b # is [phone]  Jill Alexanders Spoke to Donnelly Angelica, recommendation to have pt been seen for AWV

## 2017-06-26 ENCOUNTER — Encounter: Payer: Self-pay | Admitting: Family Medicine

## 2017-06-26 ENCOUNTER — Other Ambulatory Visit
Admission: RE | Admit: 2017-06-26 | Discharge: 2017-06-26 | Disposition: A | Discharge status: Home / Self Care | Payer: Medicare (Managed Care) | Source: Ambulatory Visit | Attending: Family Medicine | Admitting: Family Medicine

## 2017-06-26 ENCOUNTER — Ambulatory Visit (INDEPENDENT_AMBULATORY_CARE_PROVIDER_SITE_OTHER): Payer: Medicare (Managed Care) | Admitting: Family Medicine

## 2017-06-26 VITALS — BP 84/60 | HR 110 | Resp 16 | Ht 67.0 in | Wt 110.0 lb

## 2017-06-26 DIAGNOSIS — R531 Weakness: Secondary | ICD-10-CM | POA: Insufficient documentation

## 2017-06-26 DIAGNOSIS — D649 Anemia, unspecified: Secondary | ICD-10-CM | POA: Diagnosis present

## 2017-06-26 DIAGNOSIS — Z8249 Family history of ischemic heart disease and other diseases of the circulatory system: Secondary | ICD-10-CM | POA: Insufficient documentation

## 2017-06-26 DIAGNOSIS — I959 Hypotension, unspecified: Secondary | ICD-10-CM | POA: Insufficient documentation

## 2017-06-26 DIAGNOSIS — F3341 Major depressive disorder, recurrent, in partial remission: Secondary | ICD-10-CM | POA: Diagnosis not present

## 2017-06-26 DIAGNOSIS — M519 Unspecified thoracic, thoracolumbar and lumbosacral intervertebral disc disorder: Secondary | ICD-10-CM | POA: Diagnosis not present

## 2017-06-26 DIAGNOSIS — R296 Repeated falls: Secondary | ICD-10-CM

## 2017-06-26 DIAGNOSIS — E039 Hypothyroidism, unspecified: Secondary | ICD-10-CM

## 2017-06-26 DIAGNOSIS — N183 Chronic kidney disease, stage 3 unspecified: Secondary | ICD-10-CM

## 2017-06-26 DIAGNOSIS — R634 Abnormal weight loss: Secondary | ICD-10-CM

## 2017-06-26 DIAGNOSIS — F418 Other specified anxiety disorders: Secondary | ICD-10-CM | POA: Insufficient documentation

## 2017-06-26 DIAGNOSIS — R51 Headache: Secondary | ICD-10-CM | POA: Diagnosis not present

## 2017-06-26 DIAGNOSIS — G8929 Other chronic pain: Secondary | ICD-10-CM

## 2017-06-26 DIAGNOSIS — Z8679 Personal history of other diseases of the circulatory system: Secondary | ICD-10-CM | POA: Diagnosis not present

## 2017-06-26 DIAGNOSIS — B354 Tinea corporis: Secondary | ICD-10-CM | POA: Diagnosis not present

## 2017-06-26 DIAGNOSIS — M5090 Cervical disc disorder, unspecified, unspecified cervical region: Secondary | ICD-10-CM

## 2017-06-26 DIAGNOSIS — B0229 Other postherpetic nervous system involvement: Secondary | ICD-10-CM

## 2017-06-26 DIAGNOSIS — R519 Headache, unspecified: Secondary | ICD-10-CM | POA: Insufficient documentation

## 2017-06-26 LAB — COMPREHENSIVE METABOLIC PANEL
ALBUMIN: 4.1 g/dL (ref 3.5–5.0)
ALT: 11 U/L — ABNORMAL LOW (ref 14–54)
AST: 17 U/L (ref 15–41)
Alkaline Phosphatase: 74 U/L (ref 38–126)
Anion gap: 8 (ref 5–15)
BUN: 16 mg/dL (ref 6–20)
CHLORIDE: 109 mmol/L (ref 101–111)
CO2: 23 mmol/L (ref 22–32)
Calcium: 9.7 mg/dL (ref 8.9–10.3)
Creatinine, Ser: 1.5 mg/dL — ABNORMAL HIGH (ref 0.44–1.00)
GFR calc Af Amer: 42 mL/min — ABNORMAL LOW (ref >60)
GFR, EST NON AFRICAN AMERICAN: 36 mL/min — AB (ref >60)
Glucose, Bld: 94 mg/dL (ref 65–99)
POTASSIUM: 3.6 mmol/L (ref 3.5–5.1)
SODIUM: 140 mmol/L (ref 135–145)
Total Bilirubin: 0.4 mg/dL (ref 0.3–1.2)
Total Protein: 8.4 g/dL — ABNORMAL HIGH (ref 6.5–8.1)

## 2017-06-26 LAB — LIPID PANEL
Cholesterol: 165 mg/dL (ref 0–200)
HDL: 37 mg/dL — ABNORMAL LOW (ref >40)
LDL CALC: 84 mg/dL (ref 0–99)
Total CHOL/HDL Ratio: 4.5 RATIO
Triglycerides: 221 mg/dL — ABNORMAL HIGH (ref <150)
VLDL: 44 mg/dL — ABNORMAL HIGH (ref 0–40)

## 2017-06-26 LAB — FOLATE: Folate: 18.7 ng/mL (ref >5.9)

## 2017-06-26 LAB — CBC
HEMATOCRIT: 36.3 % (ref 35.0–47.0)
Hemoglobin: 11.9 g/dL — ABNORMAL LOW (ref 12.0–16.0)
MCH: 28.7 pg (ref 26.0–34.0)
MCHC: 32.9 g/dL (ref 32.0–36.0)
MCV: 87.3 fL (ref 80.0–100.0)
Platelets: 305 10*3/uL (ref 150–440)
RBC: 4.15 MIL/uL (ref 3.80–5.20)
RDW: 14.7 % — ABNORMAL HIGH (ref 11.5–14.5)
WBC: 9.9 10*3/uL (ref 3.6–11.0)

## 2017-06-26 LAB — IRON AND TIBC
Iron: 57 ug/dL (ref 28–170)
SATURATION RATIOS: 17 % (ref 10.4–31.8)
TIBC: 347 ug/dL (ref 250–450)
UIBC: 290 ug/dL

## 2017-06-26 LAB — TSH: TSH: 0.013 u[IU]/mL — ABNORMAL LOW (ref 0.350–4.500)

## 2017-06-26 LAB — VITAMIN B12: Vitamin B-12: 1421 pg/mL — ABNORMAL HIGH (ref 180–914)

## 2017-06-26 MED ORDER — FLUCONAZOLE 150 MG PO TABS: 150.0000 mg | ORAL_TABLET | Freq: Once | ORAL | 0 refills | Status: AC

## 2017-06-26 MED ORDER — GABAPENTIN 600 MG PO TABS
600.0000 mg | ORAL_TABLET | Freq: Three times a day (TID) | ORAL | 2 refills | Status: DC
Start: 2017-06-26 — End: 2018-03-15
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
## 2017-06-27 ENCOUNTER — Other Ambulatory Visit: Payer: Self-pay | Admitting: Family Medicine

## 2017-06-27 LAB — VITAMIN D 25 HYDROXY (VIT D DEFICIENCY, FRACTURES): VIT D 25 HYDROXY: 28.6 ng/mL — AB (ref 30.0–100.0)

## 2017-06-27 MED ORDER — LEVOTHYROXINE SODIUM 100 MCG PO TABS: 100.0000 ug | ORAL_TABLET | Freq: Every day | ORAL | 2 refills | Status: DC

## 2017-06-27 MED ORDER — VITAMIN D-3 25 MCG (1000 UT) PO CAPS: 1.0000 | ORAL_CAPSULE | Freq: Every day | ORAL | Status: DC

## 2017-06-27 MED ORDER — ACETAMINOPHEN 500 MG PO TABS: 1000.0000 mg | ORAL_TABLET | Freq: Two times a day (BID) | ORAL | 0 refills | Status: DC

## 2017-07-05 ENCOUNTER — Emergency Department: Payer: Medicare (Managed Care)

## 2017-07-05 ENCOUNTER — Emergency Department
Admission: EM | Admit: 2017-07-05 | Discharge: 2017-07-06 | Disposition: A | Discharge status: Home / Self Care | Payer: Medicare (Managed Care) | Attending: Emergency Medicine | Admitting: Emergency Medicine

## 2017-07-05 ENCOUNTER — Encounter: Payer: Self-pay | Admitting: Emergency Medicine

## 2017-07-05 DIAGNOSIS — N183 Chronic kidney disease, stage 3 (moderate): Secondary | ICD-10-CM | POA: Diagnosis not present

## 2017-07-05 DIAGNOSIS — E039 Hypothyroidism, unspecified: Secondary | ICD-10-CM | POA: Diagnosis not present

## 2017-07-05 DIAGNOSIS — G459 Transient cerebral ischemic attack, unspecified: Secondary | ICD-10-CM | POA: Diagnosis not present

## 2017-07-05 DIAGNOSIS — Z79899 Other long term (current) drug therapy: Secondary | ICD-10-CM | POA: Insufficient documentation

## 2017-07-05 DIAGNOSIS — F17200 Nicotine dependence, unspecified, uncomplicated: Secondary | ICD-10-CM | POA: Insufficient documentation

## 2017-07-05 DIAGNOSIS — R531 Weakness: Secondary | ICD-10-CM | POA: Diagnosis present

## 2017-07-05 HISTORY — DX: Zoster without complications: B02.9

## 2017-07-05 LAB — URINE DRUG SCREEN, QUALITATIVE (ARMC ONLY)
Amphetamines, Ur Screen: NOT DETECTED
BARBITURATES, UR SCREEN: NOT DETECTED
BENZODIAZEPINE, UR SCRN: POSITIVE — AB
CANNABINOID 50 NG, UR ~~LOC~~: NOT DETECTED
Cocaine Metabolite,Ur ~~LOC~~: NOT DETECTED
MDMA (ECSTASY) UR SCREEN: NOT DETECTED
Methadone Scn, Ur: NOT DETECTED
Opiate, Ur Screen: NOT DETECTED
PHENCYCLIDINE (PCP) UR S: NOT DETECTED
TRICYCLIC, UR SCREEN: POSITIVE — AB

## 2017-07-05 LAB — PROTIME-INR
INR: 1.12
PROTHROMBIN TIME: 14.3 s (ref 11.4–15.2)

## 2017-07-05 LAB — URINALYSIS, ROUTINE W REFLEX MICROSCOPIC
Bilirubin Urine: NEGATIVE
GLUCOSE, UA: NEGATIVE mg/dL
HGB URINE DIPSTICK: NEGATIVE
Ketones, ur: NEGATIVE mg/dL
Nitrite: POSITIVE — AB
PH: 6 (ref 5.0–8.0)
PROTEIN: NEGATIVE mg/dL
Specific Gravity, Urine: 1.006 (ref 1.005–1.030)

## 2017-07-05 LAB — COMPREHENSIVE METABOLIC PANEL
ALK PHOS: 62 U/L (ref 38–126)
ALT: 13 U/L — AB (ref 14–54)
AST: 20 U/L (ref 15–41)
Albumin: 3.5 g/dL (ref 3.5–5.0)
Anion gap: 9 (ref 5–15)
BUN: 15 mg/dL (ref 6–20)
CALCIUM: 9.1 mg/dL (ref 8.9–10.3)
CO2: 22 mmol/L (ref 22–32)
CREATININE: 1.18 mg/dL — AB (ref 0.44–1.00)
Chloride: 105 mmol/L (ref 101–111)
GFR calc non Af Amer: 49 mL/min — ABNORMAL LOW (ref >60)
GFR, EST AFRICAN AMERICAN: 56 mL/min — AB (ref >60)
Glucose, Bld: 86 mg/dL (ref 65–99)
Potassium: 3.7 mmol/L (ref 3.5–5.1)
SODIUM: 136 mmol/L (ref 135–145)
Total Bilirubin: 0.3 mg/dL (ref 0.3–1.2)
Total Protein: 7 g/dL (ref 6.5–8.1)

## 2017-07-05 LAB — CBC
HEMATOCRIT: 30.6 % — AB (ref 35.0–47.0)
HEMOGLOBIN: 10.3 g/dL — AB (ref 12.0–16.0)
MCH: 29 pg (ref 26.0–34.0)
MCHC: 33.7 g/dL (ref 32.0–36.0)
MCV: 86 fL (ref 80.0–100.0)
PLATELETS: 267 10*3/uL (ref 150–440)
RBC: 3.55 MIL/uL — AB (ref 3.80–5.20)
RDW: 15.1 % — ABNORMAL HIGH (ref 11.5–14.5)
WBC: 7.3 10*3/uL (ref 3.6–11.0)

## 2017-07-05 LAB — DIFFERENTIAL
Basophils Absolute: 0.1 10*3/uL (ref 0–0.1)
Basophils Relative: 1 %
Eosinophils Absolute: 0.3 10*3/uL (ref 0–0.7)
Eosinophils Relative: 4 %
LYMPHS PCT: 22 %
Lymphs Abs: 1.6 10*3/uL (ref 1.0–3.6)
MONO ABS: 0.4 10*3/uL (ref 0.2–0.9)
Monocytes Relative: 6 %
NEUTROS ABS: 4.9 10*3/uL (ref 1.4–6.5)
Neutrophils Relative %: 67 %

## 2017-07-05 LAB — ETHANOL: Alcohol, Ethyl (B): <10 mg/dL (ref <10)

## 2017-07-05 LAB — TROPONIN I

## 2017-07-05 LAB — APTT: aPTT: 31 seconds (ref 24–36)

## 2017-07-05 MED ORDER — IBUPROFEN 400 MG PO TABS
400.0000 mg | ORAL_TABLET | Freq: Once | ORAL | Status: AC
  Administered 2017-07-05: 400 mg via ORAL
  Filled 2017-07-05: qty 1

## 2017-07-05 MED ORDER — ALPRAZOLAM 0.5 MG PO TABS
1.0000 mg | ORAL_TABLET | Freq: Once | ORAL | Status: AC
  Administered 2017-07-05: 1 mg via ORAL
  Filled 2017-07-05: qty 2

## 2017-07-05 NOTE — ED Notes (Signed)
Spoke with dr and verified he does not want to activate a code stroke at this time. Orders for such cancelled.

## 2017-07-05 NOTE — ED Notes (Signed)
MD Schaevitz aware of pt's HA. Per MD, pt will not be getting medication at this time.

## 2017-07-05 NOTE — ED Provider Notes (Signed)
Clinical Course as of Jul 06 101  Sat Jul 05, 2017  2346 Assuming care from Dr. Clearnce Hasten.  In short, Diana West is a 61 y.o. female with a chief complaint of right-sided weakness/numbness.  Refer to the original H&P for additional details.  The current plan of care is to follow up MRI results and reassess.  Exam was completely normal upon initial evaluation.   [CF]  Sun Jul 06, 2017  0101 No acute findings on MRI.  The patient continues to be at baseline except for a mild headache.  I gave her the results of the MRI and encouraged her to continue taking daily aspirin (with which she is inconsistent) and follow-up with her regular doctor.  We discussed the possibility of this being a TIA versus anxiety and the difficulty determining which exactly is causing her symptoms.  She has no focal neurological deficits at this time.  I gave my usual and customary return precautions.   MR Brain Wo Contrast [CF]  0102 discharged with the instructions prepared by Dr. Clearnce Hasten  [CF]    Clinical Course User Index [CF] Hinda Kehr, MD    Ct Head Wo Contrast  Result Date: 07/05/2017 CLINICAL DATA:  Weakness since 3 p.m. today. EXAM: CT HEAD WITHOUT CONTRAST TECHNIQUE: Contiguous axial images were obtained from the base of the skull through the vertex without intravenous contrast. COMPARISON:  05/05/2017. FINDINGS: Brain: Diffusely enlarged ventricles and subarachnoid spaces. No intracranial hemorrhage, mass lesion or CT evidence of acute infarction. Vascular: No hyperdense vessel or unexpected calcification. Skull: Normal. Negative for fracture or focal lesion. Sinuses/Orbits: Unremarkable. Other: None. IMPRESSION: 1. No acute abnormality. 2. Stable mild diffuse cerebral and cerebellar atrophy. Electronically Signed   By: Claudie Revering M.D.   On: 07/05/2017 20:59   Mr Brain Wo Contrast  Result Date: 07/05/2017 CLINICAL DATA:  61 y/o F; right-sided shaking and right-sided weakness with numbness. EXAM: MRI  HEAD WITHOUT CONTRAST TECHNIQUE: Multiplanar, multiecho pulse sequences of the brain and surrounding structures were obtained without intravenous contrast. COMPARISON:  07/05/2017 and 09/05/2016 CT head. FINDINGS: Brain: No acute infarction, hemorrhage, hydrocephalus, extra-axial collection or mass lesion. Mild brain parenchymal volume loss. Few foci of T2 FLAIR hyperintense signal abnormality in white matter is compatible with minimal chronic microvascular changes. Vascular: Normal flow voids. Skull and upper cervical spine: Normal marrow signal. Sinuses/Orbits: Negative. Other: None. IMPRESSION: 1. No acute intracranial abnormality. 2. Minimal stable chronic microvascular ischemic changes and mild parenchymal volume loss of the brain. Electronically Signed   By: Kristine Garbe M.D.   On: 07/05/2017 23:47    Final diagnoses:  Transient cerebral ischemia, unspecified type      Hinda Kehr, MD 07/06/17 [phone]

## 2017-07-05 NOTE — ED Triage Notes (Signed)
Pt states had an onset of weakness today at 3pm. States "i just don't feel right". No deficiets noted and able to ambulate without assist

## 2017-07-05 NOTE — ED Provider Notes (Signed)
[facility] Emergency Department Provider Note  ____________________________________________   First MD Initiated Contact with Patient 07/05/17 2031     (approximate)  I have reviewed the triage vital signs and the nursing notes.   HISTORY  Chief Complaint Weakness   HPI Diana West is a 61 y.o. female with a history of TIAs was presenting to the emergency department today with right-sided weakness and numbness. She says that the symptoms started at about 3 PM and that she googled her symptoms and became worried about stroke thereafter. She says that she has forced herself to ambulate since 3 PM. Does not take any blood thinners. Denies any pain at this time. Says that she does not feel anxious and was not more anxious than her normal level of anxiety at the time that the symptoms began.   Past Medical History:  Diagnosis Date  . Anxiety   . Cardiac arrhythmia   . Palpitation   . Shingles    she states "they are inside and not contagious"    Patient Active Problem List   Diagnosis Date Noted  . Hypothyroidism 06/26/2017  . Hypotension 06/26/2017  . Frequent falls 06/26/2017  . Anemia 06/26/2017  . Depression 06/26/2017  . FH: premature coronary heart disease 06/26/2017  . Lumbar disc disease 06/26/2017  . Cervical disc disease 06/26/2017  . Chronic headaches 06/26/2017  . History of PSVT (paroxysmal supraventricular tachycardia) 06/26/2017  . Tinea corporis 06/26/2017  . Postherpetic neuralgia 06/26/2017  . CKD (chronic kidney disease) stage 3, GFR 30-59 ml/min 06/26/2017    No past surgical history on file.  Prior to Admission medications   Medication Sig Start Date End Date Taking? Authorizing Provider  acetaminophen (TYLENOL) 500 MG tablet Take 2 tablets (1,000 mg total) by mouth 2 (two) times daily. 06/27/17   Plonk, Gwyndolyn Saxon, MD  ALPRAZolam Duanne Moron) 1 MG tablet Take by mouth. 04/06/17   [provider]  Cholecalciferol  (VITAMIN D-3) 1000 units CAPS Take 1 capsule (1,000 Units total) by mouth daily. 06/27/17   Plonk, Gwyndolyn Saxon, MD  gabapentin (NEURONTIN) 600 MG tablet Take 1 tablet (600 mg total) by mouth 3 (three) times daily. 06/26/17   Plonk, Gwyndolyn Saxon, MD  levothyroxine (SYNTHROID, LEVOTHROID) 100 MCG tablet Take 1 tablet (100 mcg total) by mouth daily before breakfast. 06/27/17   Plonk, Gwyndolyn Saxon, MD  Multiple Vitamin (MULTIVITAMIN) capsule Take 1 capsule by mouth daily.    [provider]  sertraline (ZOLOFT) 100 MG tablet Take 1 tablet by mouth daily. 05/01/17   [provider]    Allergies Codeine and Toradol [ketorolac tromethamine]  Family History  Problem Relation Age of Onset  . Family history unknown: Yes    Social History Social History  Substance Use Topics  . Smoking status: Current Some Day Smoker  . Smokeless tobacco: Never Used  . Alcohol use No    Review of Systems  Constitutional: No fever/chills Eyes: No visual changes. ENT: No sore throat. Cardiovascular: Denies chest pain. Respiratory: Denies shortness of breath. Gastrointestinal: No abdominal pain.  No nausea, no vomiting.  No diarrhea.  No constipation. Genitourinary: Negative for dysuria. Musculoskeletal: Negative for back pain. Skin: Negative for rash. Neurological: Negative for headaches   ____________________________________________   PHYSICAL EXAM:  VITAL SIGNS: ED Triage Vitals  Enc Vitals Group     BP 07/05/17 2031 113/68     Pulse Rate 07/05/17 2031 87     Resp 07/05/17 2031 18     Temp 07/05/17 2031 98.7  F (37.1 C)     Temp Source 07/05/17 2031 Oral     SpO2 07/05/17 2031 99 %     Weight 07/05/17 2029 110 lb (49.9 kg)     Height 07/05/17 2029 5\' 7"  (1.702 m)     Head Circumference --      Peak Flow --      Pain Score --      Pain Loc --      Pain Edu? --      Excl. in [place]? --     Constitutional: Alert and oriented. Well appearing and in no acute distress. Eyes: Conjunctivae are  normal.  Head: Atraumatic. Nose: No congestion/rhinnorhea. Mouth/Throat: Mucous membranes are moist.  Neck: No stridor.   Cardiovascular: Normal rate, regular rhythm. Grossly normal heart sounds.   Respiratory: Normal respiratory effort.  No retractions. Lungs CTAB. Gastrointestinal: Soft and nontender. No distention. No CVA tenderness. Musculoskeletal: No lower extremity tenderness nor edema.  No joint effusions. Neurologic:  Normal speech and language. No gross focal neurologic deficits are appreciated.patient walks with a normal gait without any assistance. Skin:  Skin is warm, dry and intact. No rash noted. Psychiatric: Mood and affect are normal. Speech and behavior are normal.  NIH Stroke Scale   Person Administering Scale: Doran Stabler  Administer stroke scale items in the order listed. Record performance in each category after each subscale exam. Do not go back and change scores. Follow directions provided for each exam technique. Scores should reflect what the patient does, not what the clinician thinks the patient can do. The clinician should record answers while administering the exam and work quickly. Except where indicated, the patient should not be coached (i.e., repeated requests to patient to make a special effort).   1a  Level of consciousness: 0=alert; keenly responsive  1b. LOC questions:  0=Performs both tasks correctly  1c. LOC commands: 0=Performs both tasks correctly  2.  Best Gaze: 0=normal  3.  Visual: 0=No visual loss  4. Facial Palsy: 0=Normal symmetric movement  5a.  Motor left arm: 0=No drift, limb holds 90 (or 45) degrees for full 10 seconds  5b.  Motor right arm: 0=No drift, limb holds 90 (or 45) degrees for full 10 seconds  6a. motor left leg: 0=No drift, limb holds 90 (or 45) degrees for full 10 seconds  6b  Motor right leg:  0=No drift, limb holds 90 (or 45) degrees for full 10 seconds  7. Limb Ataxia: 0=Absent  8.  Sensory: 0=Normal; no  sensory loss  9. Best Language:  0=No aphasia, normal  10. Dysarthria: 0=Normal  11. Extinction and Inattention: 0=No abnormality  12. Distal motor function: 0=Normal   Total:   0    ____________________________________________   LABS (all labs ordered are listed, but only abnormal results are displayed)  Labs Reviewed  ETHANOL  PROTIME-INR  APTT  CBC  DIFFERENTIAL  COMPREHENSIVE METABOLIC PANEL  TROPONIN I  URINE DRUG SCREEN, QUALITATIVE ([facility] ONLY)  URINALYSIS, ROUTINE W REFLEX MICROSCOPIC   ____________________________________________  EKG  ED ECG REPORT I, Doran Stabler, the attending physician, personally viewed and interpreted this ECG.   Date: 07/05/2017  EKG Time: 2107  Rate: 83  Rhythm: normal sinus rhythm  Axis: normal  Intervals:none  ST&T Change: no ST segment elevation or depression. single T-wave inversion in aVL  ____________________________________________  RADIOLOGY  no acute abnormality on the CT of the head. ____________________________________________   PROCEDURES  Procedure(s) performed:   Procedures  Critical Care performed:   ____________________________________________   INITIAL IMPRESSION / ASSESSMENT AND PLAN / ED COURSE  Pertinent labs & imaging results that were available during my care of the patient were reviewed by me and considered in my medical decision making (see chart for details).  DDX: Stroke, TIA, paresthesia    patient out of the foreign half hour window for TPA. Also without exam findings of LVH. We will proceed with a CT of the head and that is normal we will proceed with an MRI.    ----------------------------------------- 11:45 PM on 07/05/2017 -----------------------------------------  Patient remains asymptomatic. Pending MRI. Signed up to Dr. Karma Greaser.  ____________________________________________   FINAL CLINICAL IMPRESSION(S) / ED DIAGNOSES  TIA    NEW MEDICATIONS STARTED DURING  THIS VISIT:  New Prescriptions   No medications on file     Note:  This document was prepared using Dragon voice recognition software and may include unintentional dictation errors.     Orbie Pyo, MD 07/05/17 [phone]

## 2017-07-05 NOTE — ED Notes (Signed)
Pt to mri 

## 2017-07-06 ENCOUNTER — Other Ambulatory Visit: Payer: Self-pay | Admitting: Family Medicine

## 2017-07-06 DIAGNOSIS — G459 Transient cerebral ischemic attack, unspecified: Secondary | ICD-10-CM | POA: Insufficient documentation

## 2017-07-06 MED ORDER — ASPIRIN 81 MG PO TABS: 81.0000 mg | ORAL_TABLET | Freq: Every day | ORAL | Status: DC

## 2017-07-06 NOTE — Progress Notes (Signed)
Seen Ed for TIA, begin asa 81 mg daily.

## 2017-07-06 NOTE — ED Notes (Signed)
Pt signed copy of DC.

## 2017-07-10 ENCOUNTER — Ambulatory Visit: Payer: Medicare (Managed Care) | Admitting: Family Medicine

## 2017-07-11 ENCOUNTER — Encounter: Payer: Self-pay | Admitting: Family Medicine

## 2017-07-11 ENCOUNTER — Ambulatory Visit (INDEPENDENT_AMBULATORY_CARE_PROVIDER_SITE_OTHER): Payer: Medicare (Managed Care) | Admitting: Family Medicine

## 2017-07-11 VITALS — BP 122/82 | HR 84 | Resp 16 | Ht 67.0 in | Wt 109.0 lb

## 2017-07-11 DIAGNOSIS — G459 Transient cerebral ischemic attack, unspecified: Secondary | ICD-10-CM | POA: Diagnosis not present

## 2017-07-11 DIAGNOSIS — I959 Hypotension, unspecified: Secondary | ICD-10-CM | POA: Diagnosis not present

## 2017-07-11 DIAGNOSIS — N183 Chronic kidney disease, stage 3 unspecified: Secondary | ICD-10-CM

## 2017-07-11 DIAGNOSIS — S060X9A Concussion with loss of consciousness of unspecified duration, initial encounter: Secondary | ICD-10-CM | POA: Diagnosis not present

## 2017-07-11 DIAGNOSIS — B0229 Other postherpetic nervous system involvement: Secondary | ICD-10-CM | POA: Diagnosis not present

## 2017-07-11 DIAGNOSIS — F418 Other specified anxiety disorders: Secondary | ICD-10-CM

## 2017-07-11 DIAGNOSIS — B354 Tinea corporis: Secondary | ICD-10-CM | POA: Diagnosis not present

## 2017-07-11 DIAGNOSIS — E039 Hypothyroidism, unspecified: Secondary | ICD-10-CM | POA: Diagnosis not present

## 2017-07-11 DIAGNOSIS — R42 Dizziness and giddiness: Secondary | ICD-10-CM | POA: Insufficient documentation

## 2017-07-11 MED ORDER — FLUCONAZOLE 150 MG PO TABS: 150.0000 mg | ORAL_TABLET | Freq: Once | ORAL | 0 refills | Status: AC

## 2017-07-11 MED ORDER — TRAMADOL HCL 50 MG PO TABS: 50.0000 mg | ORAL_TABLET | Freq: Four times a day (QID) | ORAL | 0 refills | Status: DC | PRN

## 2017-07-11 NOTE — Progress Notes (Signed)
Date:  07/11/2017   Name:  Diana West   DOB:  January 03, 1956   MRN:  [id number]  PCP:  Adline Potter, MD    Chief Complaint: Transient Ischemic Attack (er f/u ); Fall (Bumped head in back after fall where she passed out. Last visit she bent down in our waiting room and got off balance. Anytime she picks up something or lowers head in standing position she has pain in middle of forehead and top of head and upon lifting head geta dizzy. This time at home she hugged a neighbor child and fell back on head passing out. Loss conciousness did not get medical attention. Headaches severe now.); Back Pain (Hurt back in fall yesterday. ); and Eye Pain (eye pain and swelling after head injury in fall )   History of Present Illness:  This is a 61 y.o. female seen for two week f/u from initial visit. Last week seen in ED for R sided numbness, CT/MRI ok, told possible TIA, asa started. Feels sxs improved but not fully resolved.  Was supposed to have carotid US and see neurology this week but rescheduled both. Yesterday faiinted and hit back of head and upper back, c/o diffuse headache and back pain, Tylenol not helping. BP improved off Elavil but feeling more anxious, requests increased Xanax dose. TSH low last visit, Synthroid dose decreased, vit D supp started, and Excedrin/ibuprofen stopped due to CKD3. Weight stable, sleeping ok on gabapentin increase to tid. Also c/o leg rash improved on Diflucan but not resolved.  Review of Systems:  Review of Systems  Constitutional: Negative for chills and fever.  Respiratory: Negative for cough and shortness of breath.   Cardiovascular: Negative for chest pain and leg swelling.  Genitourinary: Negative for difficulty urinating.    Patient Active Problem List   Diagnosis Date Noted  . Dizziness 07/11/2017  . TIA (transient ischemic attack) 07/06/2017  . Hypothyroidism 06/26/2017  . Hypotension 06/26/2017  . Falls frequently 06/26/2017  . Anemia 06/26/2017  .  Depression 06/26/2017  . FH: premature coronary heart disease 06/26/2017  . Lumbar disc disease 06/26/2017  . Cervical disc disease 06/26/2017  . Chronic headaches 06/26/2017  . History of PSVT (paroxysmal supraventricular tachycardia) 06/26/2017  . Tinea corporis 06/26/2017  . Postherpetic neuralgia 06/26/2017  . CKD (chronic kidney disease) stage 3, GFR 30-59 ml/min (HCC) 06/26/2017  . Weakness 06/26/2017    Prior to Admission medications   Medication Sig Start Date End Date Taking? Authorizing Provider  acetaminophen (TYLENOL) 500 MG tablet Take 2 tablets (1,000 mg total) by mouth 2 (two) times daily. 06/27/17  Yes Kace Hartje, Gwyndolyn Saxon, MD  ALPRAZolam Duanne Moron) 1 MG tablet Take by mouth. 04/06/17  Yes [provider]  aspirin 81 MG tablet Take 1 tablet (81 mg total) by mouth daily. 07/06/17  Yes Emika Tiano, Gwyndolyn Saxon, MD  Cholecalciferol (VITAMIN D-3) 1000 units CAPS Take 1 capsule (1,000 Units total) by mouth daily. 06/27/17  Yes Estalee Mccandlish, Gwyndolyn Saxon, MD  gabapentin (NEURONTIN) 600 MG tablet Take 1 tablet (600 mg total) by mouth 3 (three) times daily. 06/26/17  Yes Tinsley Lomas, Gwyndolyn Saxon, MD  levothyroxine (SYNTHROID, LEVOTHROID) 100 MCG tablet Take 1 tablet (100 mcg total) by mouth daily before breakfast. 06/27/17  Yes Aruna Nestler, Gwyndolyn Saxon, MD  Multiple Vitamin (MULTIVITAMIN) capsule Take 1 capsule by mouth daily.   Yes [provider]  sertraline (ZOLOFT) 100 MG tablet Take 1 tablet by mouth daily. 05/01/17  Yes [provider]  fluconazole (DIFLUCAN) 150 MG tablet Take 1 tablet (150 mg  total) by mouth once. 07/11/17 07/11/17  Yisel Megill, Gwyndolyn Saxon, MD  traMADol (ULTRAM) 50 MG tablet Take 1 tablet (50 mg total) by mouth every 6 (six) hours as needed. 07/11/17   Adline Potter, MD    Allergies  Allergen Reactions  . Codeine Itching  . Toradol [Ketorolac Tromethamine] Other (See Comments)    History reviewed. No pertinent surgical history.  Social History  Substance Use Topics  . Smoking status:  Current Some Day Smoker  . Smokeless tobacco: Never Used  . Alcohol use No    Family History  Problem Relation Age of Onset  . Family history unknown: Yes    Medication list has been reviewed and updated.  Physical Examination: BP 122/82   Pulse 84   Resp 16   Ht 5\' 7"  (1.702 m)   Wt 109 lb (49.4 kg)   SpO2 99%   BMI 17.07 kg/m   Physical Exam  Constitutional: She is oriented to person, place, and time. She appears well-developed and well-nourished.  Cardiovascular: Normal rate, regular rhythm and normal heart sounds.   Pulmonary/Chest: Effort normal and breath sounds normal.  Neurological: She is alert and oriented to person, place, and time.  Skin: Skin is warm and dry.  Mild hematoma R post scalp  Psychiatric: Her behavior is normal.  Anxious mood  Nursing note and vitals reviewed.   Assessment and Plan:  1. Concussion with loss of consciousness, initial encounter Tramadol prn, to see cards 10/11 and neuro 10/18 to assess recurrent syncope  2. TIA (transient ischemic attack) Unclear dx, cont asa, for carotid US  3. CKD (chronic kidney disease) stage 3, GFR 30-59 ml/min (HCC) Improved in ED off Excedrin/ibuprofen  4. Hypothyroidism, unspecified type On decreased Synthroid dose, consider TSH next visit  5. Hypotension, unspecified hypotension type Resolved off Elavil  6. Postherpetic neuralgia Well controlled on increased gabapentin  7. Depression with anxiety Marginal control on Zoloft/Xanax, discuss with psychiatrist in [place]  8. Tinea corporis Improved, refill Diflucan x 1 dose only  Return in about 2 weeks (around 07/25/2017).  Satira Anis. Fraida Veldman, [facility]  07/11/2017

## 2017-07-22 ENCOUNTER — Emergency Department: Payer: Medicare (Managed Care)

## 2017-07-22 ENCOUNTER — Encounter: Payer: Self-pay | Admitting: Emergency Medicine

## 2017-07-22 ENCOUNTER — Inpatient Hospital Stay
Admission: EM | Admit: 2017-07-22 | Discharge: 2017-07-24 | DRG: 689 | Disposition: A | Discharge status: Home Health | Payer: Medicare (Managed Care) | Attending: Internal Medicine | Admitting: Internal Medicine

## 2017-07-22 ENCOUNTER — Inpatient Hospital Stay: Payer: Medicare (Managed Care)

## 2017-07-22 DIAGNOSIS — N3000 Acute cystitis without hematuria: Principal | ICD-10-CM | POA: Diagnosis present

## 2017-07-22 DIAGNOSIS — E43 Unspecified severe protein-calorie malnutrition: Secondary | ICD-10-CM | POA: Insufficient documentation

## 2017-07-22 DIAGNOSIS — M549 Dorsalgia, unspecified: Secondary | ICD-10-CM | POA: Diagnosis present

## 2017-07-22 DIAGNOSIS — S40212A Abrasion of left shoulder, initial encounter: Secondary | ICD-10-CM | POA: Diagnosis present

## 2017-07-22 DIAGNOSIS — F329 Major depressive disorder, single episode, unspecified: Secondary | ICD-10-CM | POA: Diagnosis present

## 2017-07-22 DIAGNOSIS — R4182 Altered mental status, unspecified: Secondary | ICD-10-CM | POA: Diagnosis not present

## 2017-07-22 DIAGNOSIS — E039 Hypothyroidism, unspecified: Secondary | ICD-10-CM | POA: Diagnosis present

## 2017-07-22 DIAGNOSIS — W19XXXA Unspecified fall, initial encounter: Secondary | ICD-10-CM | POA: Diagnosis present

## 2017-07-22 DIAGNOSIS — R55 Syncope and collapse: Secondary | ICD-10-CM

## 2017-07-22 DIAGNOSIS — Z9181 History of falling: Secondary | ICD-10-CM

## 2017-07-22 DIAGNOSIS — N179 Acute kidney failure, unspecified: Secondary | ICD-10-CM

## 2017-07-22 DIAGNOSIS — Z8673 Personal history of transient ischemic attack (TIA), and cerebral infarction without residual deficits: Secondary | ICD-10-CM

## 2017-07-22 DIAGNOSIS — S0083XA Contusion of other part of head, initial encounter: Secondary | ICD-10-CM | POA: Diagnosis present

## 2017-07-22 DIAGNOSIS — F419 Anxiety disorder, unspecified: Secondary | ICD-10-CM | POA: Diagnosis present

## 2017-07-22 DIAGNOSIS — Z8249 Family history of ischemic heart disease and other diseases of the circulatory system: Secondary | ICD-10-CM | POA: Diagnosis not present

## 2017-07-22 DIAGNOSIS — Z681 Body mass index (BMI) 19 or less, adult: Secondary | ICD-10-CM | POA: Diagnosis not present

## 2017-07-22 DIAGNOSIS — Z79899 Other long term (current) drug therapy: Secondary | ICD-10-CM

## 2017-07-22 DIAGNOSIS — E876 Hypokalemia: Secondary | ICD-10-CM | POA: Diagnosis present

## 2017-07-22 DIAGNOSIS — G9340 Encephalopathy, unspecified: Secondary | ICD-10-CM

## 2017-07-22 DIAGNOSIS — B9620 Unspecified Escherichia coli [E. coli] as the cause of diseases classified elsewhere: Secondary | ICD-10-CM | POA: Diagnosis present

## 2017-07-22 DIAGNOSIS — F17210 Nicotine dependence, cigarettes, uncomplicated: Secondary | ICD-10-CM | POA: Diagnosis present

## 2017-07-22 DIAGNOSIS — N183 Chronic kidney disease, stage 3 (moderate): Secondary | ICD-10-CM | POA: Diagnosis present

## 2017-07-22 DIAGNOSIS — Z7982 Long term (current) use of aspirin: Secondary | ICD-10-CM | POA: Diagnosis not present

## 2017-07-22 DIAGNOSIS — R296 Repeated falls: Secondary | ICD-10-CM | POA: Diagnosis present

## 2017-07-22 HISTORY — DX: Unspecified osteoarthritis, unspecified site: M19.90

## 2017-07-22 LAB — COMPREHENSIVE METABOLIC PANEL
ALBUMIN: 3.4 g/dL — AB (ref 3.5–5.0)
ALT: 11 U/L — ABNORMAL LOW (ref 14–54)
ANION GAP: 11 (ref 5–15)
AST: 22 U/L (ref 15–41)
Alkaline Phosphatase: 69 U/L (ref 38–126)
BILIRUBIN TOTAL: 0.4 mg/dL (ref 0.3–1.2)
BUN: 20 mg/dL (ref 6–20)
CHLORIDE: 107 mmol/L (ref 101–111)
CO2: 23 mmol/L (ref 22–32)
Calcium: 8.4 mg/dL — ABNORMAL LOW (ref 8.9–10.3)
Creatinine, Ser: 1.82 mg/dL — ABNORMAL HIGH (ref 0.44–1.00)
GFR calc Af Amer: 33 mL/min — ABNORMAL LOW (ref >60)
GFR calc non Af Amer: 29 mL/min — ABNORMAL LOW (ref >60)
GLUCOSE: 123 mg/dL — AB (ref 65–99)
POTASSIUM: 2.9 mmol/L — AB (ref 3.5–5.1)
SODIUM: 141 mmol/L (ref 135–145)
TOTAL PROTEIN: 6.7 g/dL (ref 6.5–8.1)

## 2017-07-22 LAB — CBC WITH DIFFERENTIAL/PLATELET
BASOS ABS: 0 10*3/uL (ref 0–0.1)
BASOS PCT: 1 %
EOS ABS: 0.4 10*3/uL (ref 0–0.7)
Eosinophils Relative: 4 %
HEMATOCRIT: 27.8 % — AB (ref 35.0–47.0)
Hemoglobin: 9.3 g/dL — ABNORMAL LOW (ref 12.0–16.0)
Lymphocytes Relative: 10 %
Lymphs Abs: 0.8 10*3/uL — ABNORMAL LOW (ref 1.0–3.6)
MCH: 28.5 pg (ref 26.0–34.0)
MCHC: 33.5 g/dL (ref 32.0–36.0)
MCV: 85.1 fL (ref 80.0–100.0)
MONO ABS: 0.4 10*3/uL (ref 0.2–0.9)
Monocytes Relative: 4 %
NEUTROS ABS: 6.7 10*3/uL — AB (ref 1.4–6.5)
NEUTROS PCT: 81 %
Platelets: 257 10*3/uL (ref 150–440)
RBC: 3.27 MIL/uL — ABNORMAL LOW (ref 3.80–5.20)
RDW: 14.7 % — AB (ref 11.5–14.5)
WBC: 8.3 10*3/uL (ref 3.6–11.0)

## 2017-07-22 LAB — TSH: TSH: 0.019 u[IU]/mL — AB (ref 0.350–4.500)

## 2017-07-22 LAB — URINALYSIS, COMPLETE (UACMP) WITH MICROSCOPIC
BILIRUBIN URINE: NEGATIVE
GLUCOSE, UA: NEGATIVE mg/dL
HGB URINE DIPSTICK: NEGATIVE
Ketones, ur: NEGATIVE mg/dL
NITRITE: POSITIVE — AB
PROTEIN: NEGATIVE mg/dL
Specific Gravity, Urine: 1.013 (ref 1.005–1.030)
pH: 5 (ref 5.0–8.0)

## 2017-07-22 LAB — TROPONIN I

## 2017-07-22 LAB — T4, FREE: Free T4: 0.95 ng/dL (ref 0.61–1.12)

## 2017-07-22 LAB — LACTIC ACID, PLASMA: Lactic Acid, Venous: 1.1 mmol/L (ref 0.5–1.9)

## 2017-07-22 LAB — CK: CK TOTAL: 70 U/L (ref 38–234)

## 2017-07-22 MED ORDER — GADOBENATE DIMEGLUMINE 529 MG/ML IV SOLN
10.0000 mL | Freq: Once | INTRAVENOUS | Status: AC | PRN
  Administered 2017-07-22: 10 mL via INTRAVENOUS

## 2017-07-22 MED ORDER — PNEUMOCOCCAL VAC POLYVALENT 25 MCG/0.5ML IJ INJ: 0.5000 mL | INJECTION | INTRAMUSCULAR | Status: DC

## 2017-07-22 MED ORDER — TRAMADOL HCL 50 MG PO TABS: 50.0000 mg | ORAL_TABLET | Freq: Four times a day (QID) | ORAL | Status: DC | PRN

## 2017-07-22 MED ORDER — DEXTROSE 5 % IV SOLN
1.0000 g | INTRAVENOUS | Status: DC
  Administered 2017-07-23 – 2017-07-24 (×2): 1 g via INTRAVENOUS
  Filled 2017-07-22 (×2): qty 10

## 2017-07-22 MED ORDER — ADULT MULTIVITAMIN W/MINERALS CH
1.0000 | ORAL_TABLET | Freq: Every day | ORAL | Status: DC
  Administered 2017-07-22 – 2017-07-24 (×3): 1 via ORAL
  Filled 2017-07-22 (×4): qty 1

## 2017-07-22 MED ORDER — ONDANSETRON HCL 4 MG/2ML IJ SOLN: 4.0000 mg | Freq: Four times a day (QID) | INTRAMUSCULAR | Status: DC | PRN

## 2017-07-22 MED ORDER — SODIUM CHLORIDE 0.9 % IV BOLUS (SEPSIS)
1000.0000 mL | Freq: Once | INTRAVENOUS | Status: AC
  Administered 2017-07-22: 1000 mL via INTRAVENOUS

## 2017-07-22 MED ORDER — TRAMADOL HCL 50 MG PO TABS
50.0000 mg | ORAL_TABLET | Freq: Four times a day (QID) | ORAL | Status: DC | PRN
  Administered 2017-07-22 – 2017-07-24 (×5): 50 mg via ORAL
  Filled 2017-07-22 (×5): qty 1

## 2017-07-22 MED ORDER — SERTRALINE HCL 50 MG PO TABS
100.0000 mg | ORAL_TABLET | Freq: Every day | ORAL | Status: DC
  Administered 2017-07-22 – 2017-07-24 (×3): 100 mg via ORAL
  Filled 2017-07-22 (×4): qty 2

## 2017-07-22 MED ORDER — ONDANSETRON HCL 4 MG PO TABS
4.0000 mg | ORAL_TABLET | Freq: Four times a day (QID) | ORAL | Status: DC | PRN
  Administered 2017-07-22: 4 mg via ORAL
  Filled 2017-07-22: qty 1

## 2017-07-22 MED ORDER — ACETAMINOPHEN 650 MG RE SUPP: 650.0000 mg | Freq: Four times a day (QID) | RECTAL | Status: DC | PRN

## 2017-07-22 MED ORDER — GABAPENTIN 600 MG PO TABS
600.0000 mg | ORAL_TABLET | Freq: Three times a day (TID) | ORAL | Status: DC
  Administered 2017-07-22 – 2017-07-24 (×6): 600 mg via ORAL
  Filled 2017-07-22 (×6): qty 1

## 2017-07-22 MED ORDER — ASPIRIN EC 81 MG PO TBEC
81.0000 mg | DELAYED_RELEASE_TABLET | Freq: Every day | ORAL | Status: DC
  Administered 2017-07-22 – 2017-07-24 (×3): 81 mg via ORAL
  Filled 2017-07-22 (×3): qty 1

## 2017-07-22 MED ORDER — ENOXAPARIN SODIUM 30 MG/0.3ML ~~LOC~~ SOLN
30.0000 mg | SUBCUTANEOUS | Status: DC
  Filled 2017-07-22: qty 0.3

## 2017-07-22 MED ORDER — CEFTRIAXONE SODIUM IN DEXTROSE 20 MG/ML IV SOLN
1.0000 g | Freq: Once | INTRAVENOUS | Status: AC
  Administered 2017-07-22: 1 g via INTRAVENOUS
  Filled 2017-07-22: qty 50

## 2017-07-22 MED ORDER — LEVOTHYROXINE SODIUM 100 MCG PO TABS
100.0000 ug | ORAL_TABLET | Freq: Every day | ORAL | Status: DC
  Administered 2017-07-23 – 2017-07-24 (×2): 100 ug via ORAL
  Filled 2017-07-22 (×2): qty 1

## 2017-07-22 MED ORDER — MORPHINE SULFATE (PF) 2 MG/ML IV SOLN
2.0000 mg | INTRAVENOUS | Status: DC | PRN
  Administered 2017-07-22 – 2017-07-24 (×7): 2 mg via INTRAVENOUS
  Filled 2017-07-22 (×8): qty 1

## 2017-07-22 MED ORDER — POTASSIUM CHLORIDE CRYS ER 20 MEQ PO TBCR
40.0000 meq | EXTENDED_RELEASE_TABLET | ORAL | Status: AC
  Administered 2017-07-22 (×2): 40 meq via ORAL
  Filled 2017-07-22 (×3): qty 2

## 2017-07-22 MED ORDER — LORAZEPAM 2 MG/ML IJ SOLN
2.0000 mg | Freq: Once | INTRAMUSCULAR | Status: AC | PRN
  Administered 2017-07-23: 2 mg via INTRAVENOUS
  Filled 2017-07-22: qty 1

## 2017-07-22 MED ORDER — ALPRAZOLAM 1 MG PO TABS
1.0000 mg | ORAL_TABLET | Freq: Three times a day (TID) | ORAL | Status: DC
  Administered 2017-07-22 – 2017-07-24 (×7): 1 mg via ORAL
  Filled 2017-07-22 (×7): qty 1

## 2017-07-22 MED ORDER — ACETAMINOPHEN 325 MG PO TABS
650.0000 mg | ORAL_TABLET | Freq: Four times a day (QID) | ORAL | Status: DC | PRN
  Administered 2017-07-22 – 2017-07-24 (×3): 650 mg via ORAL
  Filled 2017-07-22 (×2): qty 2

## 2017-07-22 MED ORDER — SODIUM CHLORIDE 0.9 % IV SOLN
INTRAVENOUS | Status: DC
  Administered 2017-07-22 – 2017-07-24 (×4): via INTRAVENOUS

## 2017-07-22 MED ORDER — VITAMIN D3 25 MCG (1000 UNIT) PO TABS
1000.0000 [IU] | ORAL_TABLET | Freq: Every day | ORAL | Status: DC
  Administered 2017-07-22 – 2017-07-24 (×3): 1000 [IU] via ORAL
  Filled 2017-07-22 (×6): qty 1
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
## 2017-07-22 NOTE — Progress Notes (Signed)
RN notified prime doc that pt is very anxious about how she needs to take her xanax and gabapentin at this time. Verbal orders to give 1600 dose at this time. Will continue to monitor pt.   Zian Delair [employer]

## 2017-07-22 NOTE — Consult Note (Signed)
Pharmacy Antibiotic Note  Diana West is a 61 y.o. female admitted on 07/22/2017 with  UTI.  Pharmacy has been consulted for Ceftriaxone dosing.  Plan: Start Ceftriaxone 1g IV every 24 hours.   Height: 5\' 7"  (170.2 cm) IBW/kg (Calculated) : 61.6  Temp (24hrs), Avg:98.8 F (37.1 C), Min:98.3 F (36.8 C), Max:99.3 F (37.4 C)   Recent Labs Lab 07/22/17 0923  WBC 8.3  CREATININE 1.82*  LATICACIDVEN 1.1    Estimated Creatinine Clearance: 25 mL/min (A) (by C-G formula based on SCr of 1.82 mg/dL (H)).    Allergies  Allergen Reactions  . Codeine Itching  . Toradol [Ketorolac Tromethamine] Other (See Comments)    Antimicrobials this admission: 10/16 ceftriaxone  >>   Dose adjustments this admission:  Microbiology results: 10/16 UCx: pending  Thank you for allowing pharmacy to be a part of this patient's care.  Pernell Dupre, PharmD, BCPS Clinical Pharmacist 07/22/2017 2:06 PM

## 2017-07-22 NOTE — ED Notes (Signed)
ED Provider at bedside. 

## 2017-07-22 NOTE — H&P (Signed)
[facility] [place] [facility] NAME: Diana West    MR#:  [id number]  DATE OF BIRTH:  1956/07/04  DATE OF ADMISSION:  07/22/2017  PRIMARY CARE PHYSICIAN: Adline Potter, MD   REQUESTING/REFERRING PHYSICIAN: Dr. Rudene Re  CHIEF COMPLAINT:   Chief Complaint  Patient presents with  . Altered Mental Status    HISTORY OF PRESENT ILLNESS:  Diana West  is a 62 y.o. female with a known history of hypothyroidism, recurrent falls at home, cardiac arrhythmia presents to hospital secondary to a fall and confusion. Patient has recently seen a neurologist for recurrent falls/syncope. CT of the head was normal. She uses a walker at home to ambulate. She states she has been in her normal state of health up until yesterday night. Last night she felt cold and increased her thermostat to sleep. Around 3 AM she woke up feeling very hot and had to open her windows. But she was noted to be found on the floor this morning and patient does not remember. She was very confused when she initially came in. She was committed involuntarily because of her encephalopathy and she was trying to leave currently she is more and noted to be only at 100 pounds. Complains of some nausea and feeling hot today. Complaints of myalgias from the fall. Blood work shows hypokalemia and acute cystitis. She does complain of increased frequency  to use the bathroom, but denies any dysuria.   PAST MEDICAL HISTORY:   Past Medical History:  Diagnosis Date  . Anxiety   . Cardiac arrhythmia   . Falls frequently   . Palpitation   . Shingles    she states "they are inside and not contagious"  . TIA (transient ischemic attack)     PAST SURGICAL HISTORY:   Past Surgical History:  Procedure Laterality Date  . ABDOMINAL HYSTERECTOMY    . CESAREAN SECTION    . CHOLECYSTECTOMY      SOCIAL HISTORY:   Social History  Substance Use Topics  . Smoking status: Current Some Day Smoker     Packs/day: 0.25  . Smokeless tobacco: Never Used  . Alcohol use No    FAMILY HISTORY:   Family History  Problem Relation Age of Onset  . CAD Mother   . CAD Father     DRUG ALLERGIES:   Allergies  Allergen Reactions  . Codeine Itching  . Toradol [Ketorolac Tromethamine] Other (See Comments)    REVIEW OF SYSTEMS:   Review of Systems  Constitutional: Positive for malaise/fatigue. Negative for chills, fever (allergies fluids, antibiotics.) and weight loss.  HENT: Negative for ear discharge, hearing loss and nosebleeds.   Eyes: Negative for blurred vision, double vision and photophobia.  Respiratory: Negative for cough, hemoptysis, shortness of breath and wheezing.   Cardiovascular: Negative for chest pain, palpitations, orthopnea and leg swelling.  Gastrointestinal: Positive for nausea. Negative for abdominal pain, constipation, diarrhea, heartburn, melena and vomiting.  Genitourinary: Positive for frequency. Negative for dysuria and urgency.  Musculoskeletal: Positive for falls and myalgias. Negative for back pain and neck pain.  Skin: Negative for rash.  Neurological: Negative for dizziness, sensory change, speech change, focal weakness and headaches.  Endo/Heme/Allergies: Does not bruise/bleed easily.  Psychiatric/Behavioral: Negative for depression.    MEDICATIONS AT HOME:   Prior to Admission medications   Medication Sig Start Date End Date Taking? Authorizing Provider  ALPRAZolam Duanne Moron) 1 MG tablet Take 1 mg by mouth 3 (three) times daily.  04/06/17  Yes [provider]  aspirin 81 MG tablet Take 1 tablet (81 mg total) by mouth daily. 07/06/17  Yes Plonk, Gwyndolyn Saxon, MD  Cholecalciferol (VITAMIN D-3) 1000 units CAPS Take 1 capsule (1,000 Units total) by mouth daily. 06/27/17  Yes Plonk, Gwyndolyn Saxon, MD  gabapentin (NEURONTIN) 600 MG tablet Take 1 tablet (600 mg total) by mouth 3 (three) times daily. 06/26/17  Yes Plonk, Gwyndolyn Saxon, MD  levothyroxine (SYNTHROID,  LEVOTHROID) 100 MCG tablet Take 1 tablet (100 mcg total) by mouth daily before breakfast. 06/27/17  Yes Plonk, Gwyndolyn Saxon, MD  Multiple Vitamin (MULTIVITAMIN) capsule Take 1 capsule by mouth daily.   Yes [provider]  sertraline (ZOLOFT) 100 MG tablet Take 1 tablet by mouth daily. 05/01/17  Yes [provider]  acetaminophen (TYLENOL) 500 MG tablet Take 2 tablets (1,000 mg total) by mouth 2 (two) times daily. 06/27/17   Plonk, Gwyndolyn Saxon, MD  traMADol (ULTRAM) 50 MG tablet Take 1 tablet (50 mg total) by mouth every 6 (six) hours as needed. 07/11/17   Plonk, Gwyndolyn Saxon, MD      VITAL SIGNS:  Blood pressure 127/75, pulse 88, temperature 99.3 F (37.4 C), temperature source Oral, resp. rate 13, SpO2 95 %.  PHYSICAL EXAMINATION:   Physical Exam  GENERAL:  61 y.o.-year-old ill nourished patient lying in the bed with no acute distress.  EYES: Pupils equal, round, reactive to light and accommodation. No scleral icterus. Extraocular muscles intact.  Old healed bruise noted around right eye HEENT: Head atraumatic, normocephalic. Oropharynx and nasopharynx clear.  NECK:  Supple, no jugular venous distention. No thyroid enlargement, no tenderness.  LUNGS: Normal breath sounds bilaterally, no wheezing, rales,rhonchi or crepitation. No use of accessory muscles of respiration.  CARDIOVASCULAR: S1, S2 normal. No murmurs, rubs, or gallops.  ABDOMEN: Soft, nontender, nondistended. Bowel sounds present. No organomegaly or mass.  EXTREMITIES: No pedal edema, cyanosis, or clubbing.  NEUROLOGIC: Cranial nerves II through XII are intact. Muscle strength 5/5 in all extremities. Sensation intact. Gait not checked.  PSYCHIATRIC: The patient is alert and oriented x 3.  SKIN: No obvious rash, lesion, or ulcer.   LABORATORY PANEL:   CBC  Recent Labs Lab 07/22/17 0923  WBC 8.3  HGB 9.3*  HCT 27.8*  PLT 257    ------------------------------------------------------------------------------------------------------------------  Chemistries   Recent Labs Lab 07/22/17 0923  NA 141  K 2.9*  CL 107  CO2 23  GLUCOSE 123*  BUN 20  CREATININE 1.82*  CALCIUM 8.4*  AST 22  ALT 11*  ALKPHOS 69  BILITOT 0.4   ------------------------------------------------------------------------------------------------------------------  Cardiac Enzymes No results for input(s): TROPONINI in the last 168 hours. ------------------------------------------------------------------------------------------------------------------  RADIOLOGY:  Ct Head Wo Contrast  Result Date: 07/22/2017 CLINICAL DATA:  Pt fell this morning and bruised left forehead and left eye, also bilateral neck pain EXAM: CT HEAD WITHOUT CONTRAST CT CERVICAL SPINE WITHOUT CONTRAST TECHNIQUE: Multidetector CT imaging of the head and cervical spine was performed following the standard protocol without intravenous contrast. Multiplanar CT image reconstructions of the cervical spine were also generated. COMPARISON:  None. FINDINGS: CT HEAD FINDINGS Brain: No evidence of acute infarction, hemorrhage, extra-axial collection, ventriculomegaly, or mass effect. Generalized cerebral atrophy. Periventricular white matter low attenuation likely secondary to microangiopathy. Vascular: Cerebrovascular atherosclerotic calcifications are noted. Skull: Negative for fracture or focal lesion. Sinuses/Orbits: Visualized portions of the orbits are unremarkable. Visualized portions of the paranasal sinuses and mastoid air cells are unremarkable. Other: None. CT CERVICAL SPINE FINDINGS Alignment: 2 mm retrolisthesis of C5 on C6.  2 mm anterolisthesis of C7 on T1. Loss the normal cervical lordosis with mild kyphosis centered at C5. Skull base and vertebrae: No acute fracture. No primary bone lesion or focal pathologic process. Soft tissues and spinal canal: No prevertebral fluid  or swelling. No visible canal hematoma. Disc levels: Degenerative disc disease with disc height loss at C4-5 and C5-6. Severe left facet arthropathy at C2-3 with mild left foraminal stenosis. Severe right facet arthropathy at C3-4 with right foraminal stenosis. Broad-based disc osteophyte complex and bilateral uncovertebral degenerative changes at C4-5. Broad-based disc osteophyte complex at C5-6 with bilateral uncovertebral degenerative changes and mild bilateral foraminal narrowing. Severe bilateral facet arthropathy at C7-T1. Upper chest: Lung apices are clear. Other: No fluid collection or hematoma. IMPRESSION: 1. No acute intracranial pathology. 2. No acute osseous injury of the cervical spine. Electronically Signed   By: Kathreen Devoid   On: 07/22/2017 10:08   Ct Cervical Spine Wo Contrast  Result Date: 07/22/2017 CLINICAL DATA:  Pt fell this morning and bruised left forehead and left eye, also bilateral neck pain EXAM: CT HEAD WITHOUT CONTRAST CT CERVICAL SPINE WITHOUT CONTRAST TECHNIQUE: Multidetector CT imaging of the head and cervical spine was performed following the standard protocol without intravenous contrast. Multiplanar CT image reconstructions of the cervical spine were also generated. COMPARISON:  None. FINDINGS: CT HEAD FINDINGS Brain: No evidence of acute infarction, hemorrhage, extra-axial collection, ventriculomegaly, or mass effect. Generalized cerebral atrophy. Periventricular white matter low attenuation likely secondary to microangiopathy. Vascular: Cerebrovascular atherosclerotic calcifications are noted. Skull: Negative for fracture or focal lesion. Sinuses/Orbits: Visualized portions of the orbits are unremarkable. Visualized portions of the paranasal sinuses and mastoid air cells are unremarkable. Other: None. CT CERVICAL SPINE FINDINGS Alignment: 2 mm retrolisthesis of C5 on C6. 2 mm anterolisthesis of C7 on T1. Loss the normal cervical lordosis with mild kyphosis centered at C5.  Skull base and vertebrae: No acute fracture. No primary bone lesion or focal pathologic process. Soft tissues and spinal canal: No prevertebral fluid or swelling. No visible canal hematoma. Disc levels: Degenerative disc disease with disc height loss at C4-5 and C5-6. Severe left facet arthropathy at C2-3 with mild left foraminal stenosis. Severe right facet arthropathy at C3-4 with right foraminal stenosis. Broad-based disc osteophyte complex and bilateral uncovertebral degenerative changes at C4-5. Broad-based disc osteophyte complex at C5-6 with bilateral uncovertebral degenerative changes and mild bilateral foraminal narrowing. Severe bilateral facet arthropathy at C7-T1. Upper chest: Lung apices are clear. Other: No fluid collection or hematoma. IMPRESSION: 1. No acute intracranial pathology. 2. No acute osseous injury of the cervical spine. Electronically Signed   By: Kathreen Devoid   On: 07/22/2017 10:08   Dg Chest Portable 1 View  Result Date: 07/22/2017 CLINICAL DATA:  Fever.  History of TIA and multiple falls. EXAM: PORTABLE CHEST 1 VIEW COMPARISON:  No recent prior. FINDINGS: Mediastinum hilar structures normal. Lungs are clear. No focal infiltrate. No pleural effusion or pneumothorax. Degenerative changes thoracic spine. IMPRESSION: No acute cardiopulmonary disease. Electronically Signed   By: Marcello Moores  Register   On: 07/22/2017 09:44   Dg Shoulder Left  Result Date: 07/22/2017 CLINICAL DATA:  Multiple falls. Left shoulder pain. Initial encounter. EXAM: LEFT SHOULDER - 2+ VIEW COMPARISON:  None. FINDINGS: There is no evidence of fracture or dislocation. There is no evidence of arthropathy or other focal bone abnormality. Soft tissues are unremarkable. IMPRESSION: Negative exam. Electronically Signed   By: Diana Rise M.D.   On: 07/22/2017 10:21  EKG:   Orders placed or performed during the hospital encounter of 07/22/17  . EKG 12-Lead  . EKG 12-Lead    IMPRESSION AND PLAN:    Diana West  is a 61 y.o. female with a known history of hypothyroidism, recurrent falls at home, cardiac arrhythmia presents to hospital secondary to a fall and confusion.  #1 acute metabolic encephalopathy-secondary to acute cystitis. - plan I fluids, IV antibiotics -CT of the head negative for any acute findings and admission  #2 recurrent falls-seeing neurology as outpatient. EEG was normal -Plan was to get an outpatient MRI of the brain. Will order cultures and patient since came in after a fall. -We will need outpatient cardiology referral and Holter monitoring - Physical therapy consulted.  #3 acute cystitis-follow-up urine cultures -Started on Rocephin  #4 hypokalemia-eating replace.  #5 acute renal failure on CK D-Baseline creatinine seems to be around 1.2, continue IV fluids -Avoid nephrotoxins  #6 hypothyroidism-continue Synthroid. TSH seems to be low, check free T4  #7 DVT prophylaxis-weight adjusted Lovenox  Physical therapy consult    All the records are reviewed and case discussed with ED provider. Management plans discussed with the patient, family and they are in agreement.  CODE STATUS: full code  TOTAL TIME TAKING CARE OF THIS PATIENT: 50 minutes.    Diana West M.D on 07/22/2017 at 12:17 PM  Between 7am to 6pm - Pager - [phone]  After 6pm go to [email or web address] - password [email or web address] [email or web address]  Office  [phone]  CC: Primary care physician; Adline Potter, MD

## 2017-07-22 NOTE — ED Provider Notes (Signed)
[facility] Emergency Department Provider Note  ____________________________________________  Time seen: Approximately 9:56 AM  I have reviewed the triage vital signs and the nursing notes.   HISTORY  Chief Complaint Altered Mental Status   HPI Diana West is a 61 y.o. female with a history of frequent falls, TIA, hypothyroidismwho presents IVC'ed by [employer] for AMS. according to police they were called to the patient's residence because patient was screaming inside her apartment. She had barricaded herself inside. When police entered the apartment found patient on the ground. They report that the apartment was extremely hot, 100F and patient's temp was 104F. Patient was confused when the police was in there. She was then placed into a neighbor's car to be brought to the hospital and started becoming agitated and screaming because she thought she was in an ambulance. Mental status was wax and waning per police and patient started to refuse to come to the ER so IVC was taken by police. patient tells me that she falls regularly. She has been seen here several times in the past for falls. She reports that she hit her head on the ground. She tells me that she just loses her balance and falls. She does have a walker at home. She denies feeling dizzy. She is denying chest pain, shortness of breath, abdominal pain, nausea, vomiting. She is complaining of moderate constant diffuse back pain and face pain since the fall earlier today. According to the patient she has had her thermostat to [place] last night. She woke up this morning and felt really hot. When she went to check on her thermostat was 104F. she try to fix it and went back to bed. She reports falling 3 times this morning but is unable to give me specific details of the fall.  Past Medical History:  Diagnosis Date  . Anxiety   . Arthritis   . Cardiac arrhythmia   . Falls frequently   . Palpitation   .  Shingles    she states "they are inside and not contagious"  . TIA (transient ischemic attack)     Patient Active Problem List   Diagnosis Date Noted  . Acute cystitis 07/22/2017  . TIA (transient ischemic attack) 07/06/2017  . Hypothyroidism 06/26/2017  . Hypotension 06/26/2017  . Falls frequently 06/26/2017  . Anemia 06/26/2017  . Depression with anxiety 06/26/2017  . FH: premature coronary heart disease 06/26/2017  . Lumbar disc disease 06/26/2017  . Cervical disc disease 06/26/2017  . Chronic headaches 06/26/2017  . History of PSVT (paroxysmal supraventricular tachycardia) 06/26/2017  . Tinea corporis 06/26/2017  . Postherpetic neuralgia 06/26/2017  . CKD (chronic kidney disease) stage 3, GFR 30-59 ml/min (HCC) 06/26/2017    Past Surgical History:  Procedure Laterality Date  . ABDOMINAL HYSTERECTOMY    . CESAREAN SECTION    . CHOLECYSTECTOMY      Prior to Admission medications   Medication Sig Start Date End Date Taking? Authorizing Provider  ALPRAZolam Duanne Moron) 1 MG tablet Take 1 mg by mouth 3 (three) times daily.  04/06/17  Yes [provider]  aspirin 81 MG tablet Take 1 tablet (81 mg total) by mouth daily. 07/06/17  Yes Plonk, Gwyndolyn Saxon, MD  Cholecalciferol (VITAMIN D-3) 1000 units CAPS Take 1 capsule (1,000 Units total) by mouth daily. 06/27/17  Yes Plonk, Gwyndolyn Saxon, MD  gabapentin (NEURONTIN) 600 MG tablet Take 1 tablet (600 mg total) by mouth 3 (three) times daily. 06/26/17  Yes Plonk, Gwyndolyn Saxon, MD  levothyroxine (  SYNTHROID, LEVOTHROID) 100 MCG tablet Take 1 tablet (100 mcg total) by mouth daily before breakfast. 06/27/17  Yes Plonk, Gwyndolyn Saxon, MD  Multiple Vitamin (MULTIVITAMIN) capsule Take 1 capsule by mouth daily.   Yes [provider]  sertraline (ZOLOFT) 100 MG tablet Take 1 tablet by mouth daily. 05/01/17  Yes [provider]  acetaminophen (TYLENOL) 500 MG tablet Take 2 tablets (1,000 mg total) by mouth 2 (two) times daily. 06/27/17   Plonk,  Gwyndolyn Saxon, MD  traMADol (ULTRAM) 50 MG tablet Take 1 tablet (50 mg total) by mouth every 6 (six) hours as needed. 07/11/17   Plonk, Gwyndolyn Saxon, MD    Allergies Codeine and Toradol [ketorolac tromethamine]  Family History  Problem Relation Age of Onset  . CAD Mother   . CAD Father     Social History Social History  Substance Use Topics  . Smoking status: Current Some Day Smoker    Packs/day: 0.25  . Smokeless tobacco: Never Used  . Alcohol use No    Review of Systems Constitutional: Negative for fever. Eyes: Negative for visual changes. ENT: Negative for facial injury or neck injury Cardiovascular: Negative for chest injury. Respiratory: Negative for shortness of breath. Negative for chest wall injury. Gastrointestinal: Negative for abdominal pain or injury. Genitourinary: Negative for dysuria. Musculoskeletal: Negative for back injury, negative for arm or leg pain. Skin: Negative for laceration/abrasions. Neurological: + head injury, AMS  ____________________________________________   PHYSICAL EXAM:  VITAL SIGNS: ED Triage Vitals  Enc Vitals Group     BP 07/22/17 0930 99/60     Pulse Rate 07/22/17 0910 98     Resp 07/22/17 0910 16     Temp 07/22/17 0910 99.3 F (37.4 C)     Temp Source 07/22/17 0910 Oral     SpO2 07/22/17 0910 96 %     Weight --      Height --      Head Circumference --      Peak Flow --      Pain Score 07/22/17 0910 0     Pain Loc --      Pain Edu? --      Excl. in [place]? --    Constitutional: Alert and oriented x 2. No acute distress. Does not appear intoxicated. HEENT Head: Normocephalic, bruising to bilateral face. Face: No facial bony tenderness. Stable midface Ears: No hemotympanum bilaterally. No Battle sign Eyes: No eye injury. PERRL. No raccoon eyes Nose: Nontender. No epistaxis. No rhinorrhea Mouth/Throat: Mucous membranes are moist. No oropharyngeal blood. No dental injury. Airway patent without stridor. Normal voice. Neck: no  C-collar in place. No midline c-spine tenderness.  Cardiovascular: Normal rate, regular rhythm. Normal and symmetric distal pulses are present in all extremities. Pulmonary/Chest: Chest wall is stable and nontender to palpation/compression. Normal respiratory effort. Breath sounds are normal. No crepitus.  Abdominal: Soft, nontender, non distended. Musculoskeletal: Nontender with normal full range of motion in all extremities. No deformities. No thoracic or lumbar midline spinal tenderness. Pelvis is stable. Abrasion to the L shoulder Skin: Skin is warm, dry and intact.  Neurological: Moves all extremities to command. Face is symmetric  Glascow Coma Score: 4 - Opens eyes on own 6 - Follows simple motor commands 5 - Alert and oriented GCS: 15   ____________________________________________   LABS (all labs ordered are listed, but only abnormal results are displayed)  Labs Reviewed  CBC WITH DIFFERENTIAL/PLATELET - Abnormal; Notable for the following:       Result Value  RBC 3.27 (*)    Hemoglobin 9.3 (*)    HCT 27.8 (*)    RDW 14.7 (*)    Neutro Abs 6.7 (*)    Lymphs Abs 0.8 (*)    All other components within normal limits  COMPREHENSIVE METABOLIC PANEL - Abnormal; Notable for the following:    Potassium 2.9 (*)    Glucose, Bld 123 (*)    Creatinine, Ser 1.82 (*)    Calcium 8.4 (*)    Albumin 3.4 (*)    ALT 11 (*)    GFR calc non Af Amer 29 (*)    GFR calc Af Amer 33 (*)    All other components within normal limits  URINALYSIS, COMPLETE (UACMP) WITH MICROSCOPIC - Abnormal; Notable for the following:    Color, Urine YELLOW (*)    APPearance CLEAR (*)    Nitrite POSITIVE (*)    Leukocytes, UA TRACE (*)    Bacteria, UA RARE (*)    Squamous Epithelial / LPF 0-5 (*)    All other components within normal limits  TSH - Abnormal; Notable for the following:    TSH 0.019 (*)    All other components within normal limits  URINE CULTURE  LACTIC ACID, PLASMA  CK  T4, FREE   TROPONIN I  HIV ANTIBODY (ROUTINE TESTING)  TROPONIN I  TROPONIN I   ____________________________________________  EKG  ED ECG REPORT I, Rudene Re, the attending physician, personally viewed and interpreted this ECG.  Normal sinus rhythm, rate of 97, normal intervals, normal axis, no ST elevations or depressions.  ____________________________________________  RADIOLOGY  CXR: Negative ____________________________________________   PROCEDURES  Procedure(s) performed: None Procedures Critical Care performed:  None ____________________________________________   INITIAL IMPRESSION / ASSESSMENT AND PLAN / ED COURSE  61 y.o. female with a history of frequent falls, TIA, hypothyroidismwho presents IVC'ed by [employer] for AMS and multiple falls. patient seems confused and unable to give a coherent story. We'll check basic labs, check thyroid, check her CK to evaluate for rhabdo, changes in her thyroid hormone and they could justify her confusion, dehydration, anemia, electrolyte abnormalities, or infection. We'll give a cool IV fluids. We'll do head CT and CT neck to rule out any acute injuries from the fall. We'll x-ray her shoulder. We'll try to get a hold of patient's son.    _________________________ 11:19 AM on 07/22/2017 -----------------------------------------  spoke with patient's son who tells me the patient does have a history of falls and has been followed by neurology to try to determine the etiology of her falls. Son last saw patient 2 days ago and she was doing well. He reports that her barricaded in the apartment and confusion are definitely not patient's baseline. Lab work concerning for acute kidney injury and a UTI. TSH is also very low. Free T4 is pending. Will start patient on Rocephin, IV fluids, and admitted to the hospitalist service.  As part of my medical decision making, I reviewed the following data within the [place] History obtained  from family, Nursing notes reviewed and incorporated, Labs reviewed , EKG interpreted , Old EKG reviewed, Old chart reviewed, Discussed with admitting physician , Notes from prior ED visits and  Controlled Substance Database    Pertinent labs & imaging results that were available during my care of the patient were reviewed by me and considered in my medical decision making (see chart for details).    ____________________________________________   FINAL CLINICAL IMPRESSION(S) / ED DIAGNOSES  Final  diagnoses:  Encephalopathy acute  Acute cystitis without hematuria  AKI (acute kidney injury) ([place])      NEW MEDICATIONS STARTED DURING THIS VISIT:  Current Discharge Medication List       Note:  This document was prepared using Dragon voice recognition software and may include unintentional dictation errors.    Rudene Re, MD 07/22/17 [id number]

## 2017-07-22 NOTE — ED Notes (Signed)
Pt transferred to MRI by this RN 

## 2017-07-22 NOTE — Progress Notes (Signed)
Physical Therapy Evaluation Patient Details Name: Diana West MRN: [id number] DOB: October 15, 1955 Today's Date: 07/22/2017   History of Present Illness  Pt admitted for acute cystitis, pt was reported to be found on floor, was confused. Pt had recently seen neurologist for recurrent syncope/falls. PMH of hypothyroidism, cardiac arrhythmias, and recurrent falls at home. CT, chest, and MRI imaging are negative.   Clinical Impression  Pt is a pleasant 61 year old female admitted for acute cystitis. Pt performed there-ex, bed mobility with mod I., transfers with RW with supervision and amb with RW with CGA. Pt amb 35ft x2, amb at a steady pace, able to navigate RW around furniture, would recommend use of RW for extra support. Pt performed all activities with no reported dizziness or LOB. Orthostatic vitals measured, pt does not demonstrate orthostasis. Pt reported being frustrated about not knowing cause for her syncopal episodes, does not feel as though it is due to balance. Pt educated on performing mobility activities slowly. Pt demonstrates deficits with strength, endurance, dynamic balance and amb. Would benefit from further skilled PT to address above deficits. Recommend transition to [place] upon DC from acute hospitalization.     Follow Up Recommendations Home health PT    Equipment Recommendations  Rolling walker with 5" wheels    Recommendations for Other Services       Precautions / Restrictions Precautions Precautions: Fall Restrictions Weight Bearing Restrictions: No      Mobility  Bed Mobility Overal bed mobility: Needs Assistance Bed Mobility: Supine to Sit     Supine to sit: Modified independent (Device/Increase time)     General bed mobility comments: Pt utilizes bed rails, able to scoot to EOB to seated with proper technique, no cues needed.  Transfers Overall transfer level: Needs assistance Equipment used: Rolling walker (2 wheeled) Transfers: Sit to/from  Stand Sit to Stand: Supervision         General transfer comment: Pt able to transfer seated EOB to standing with RW with proper technique, no cues needed. No dizziness  Ambulation/Gait Ambulation/Gait assistance: Min guard Ambulation Distance (Feet): 80 Feet Assistive device: Rolling walker (2 wheeled) Gait Pattern/deviations: Step-through pattern     General Gait Details: Pt amb with reciprocal alternating gait, able to navigate RW around furniture, able to maintain steady pace. No cues needed. CGA for safety.   Stairs            Wheelchair Mobility    Modified Rankin (Stroke Patients Only)       Balance Overall balance assessment: Needs assistance Sitting-balance support: Feet supported Sitting balance-Leahy Scale: Good Sitting balance - Comments: Pt able to maintain seated position for several min to measure BP, no dizziness or LOB   Standing balance support: Bilateral upper extremity supported Standing balance-Leahy Scale: Fair Standing balance comment: Pt able to stand with RW and maintain position to measure BP. No dizziness. Pt able to hold tandem stance on both sides without RW for 10 sec with eyes open, and 10 sec with min/mod sway with eyes closed. No dizziness                             Pertinent Vitals/Pain Pain Assessment: 0-10 Pain Score: 8  Pain Location: chest Pain Descriptors / Indicators: Sore Pain Intervention(s): Limited activity within patient's tolerance;Monitored during session;Repositioned;Patient requesting pain meds-RN notified    Home Living Family/patient expects to be discharged to:: Private residence Living Arrangements: Alone Available Help at Discharge: Family;Neighbor  Type of Home: Apartment Home Access: Stairs to enter Entrance Stairs-Rails: Psychiatric nurse of Steps: 11 Home Layout: One level Home Equipment: Walker - standard      Prior Function Level of Independence: Needs assistance          Comments: Pt states she has a walker at home but does not use it often due to difficulty with lifting walker and navigating it within her home. Pt states she goes up/down the stairs by holding onto the rail with both hands, requires assistance from neighbor or son.  Pt is able to perform ADLs independently.      Hand Dominance        Extremity/Trunk Assessment   Upper Extremity Assessment Upper Extremity Assessment: Generalized weakness (UE MMT grossly 3+/5)    Lower Extremity Assessment Lower Extremity Assessment: Generalized weakness (LE MMT grossly 4/5)    Cervical / Trunk Assessment Cervical / Trunk Assessment: Normal  Communication   Communication: No difficulties  Cognition Arousal/Alertness: Awake/alert Behavior During Therapy: WFL for tasks assessed/performed Overall Cognitive Status: Within Functional Limits for tasks assessed                                 General Comments: Visual tracking intact      General Comments      Exercises Other Exercises Other Exercises: Ther-ex, 10x B, ankle pumps, hip abd/add, SLRs, LAQs, bicep curls. Cues for proper technique, no assist needed.  Other Exercises: Pt amb 40 ft without RW with CGA, no cues needed, no LOB. However pt appears more safe to amb with RW for extra support.    Assessment/Plan    PT Assessment Patient needs continued PT services  PT Problem List Decreased strength;Decreased range of motion;Decreased activity tolerance;Decreased balance;Decreased mobility;Decreased knowledge of use of DME;Pain       PT Treatment Interventions DME instruction;Gait training;Stair training;Functional mobility training;Therapeutic activities;Therapeutic exercise;Balance training;Patient/family education    PT Goals (Current goals can be found in the Care Plan section)  Acute Rehab PT Goals Patient Stated Goal: "to figure out why I pass out, and to get stronger" PT Goal Formulation: With patient Time  For Goal Achievement: 08/05/17 Potential to Achieve Goals: Good    Frequency Min 2X/week   Barriers to discharge        Co-evaluation               AM-PAC PT "6 Clicks" Daily Activity  Outcome Measure Difficulty turning over in bed (including adjusting bedclothes, sheets and blankets)?: None Difficulty moving from lying on back to sitting on the side of the bed? : None Difficulty sitting down on and standing up from a chair with arms (e.g., wheelchair, bedside commode, etc,.)?: None Help needed moving to and from a bed to chair (including a wheelchair)?: None Help needed walking in hospital room?: A Little Help needed climbing 3-5 steps with a railing? : A Little 6 Click Score: 22    End of Session Equipment Utilized During Treatment: Gait belt Activity Tolerance: Patient tolerated treatment well Patient left: in chair;with call bell/phone within reach;with chair alarm set Nurse Communication: Mobility status PT Visit Diagnosis: Other abnormalities of gait and mobility (R26.89);Unsteadiness on feet (R26.81);Repeated falls (R29.6);Muscle weakness (generalized) (M62.81);Pain    Time: 8315-1761 PT Time Calculation (min) (ACUTE ONLY): 51 min   Charges:         PT G Codes:   PT G-Codes **NOT FOR INPATIENT CLASS** Functional  Assessment Tool Used: AM-PAC 6 Clicks Basic Mobility Functional Limitation: Mobility: Walking and moving around Mobility: Walking and Moving Around Current Status [phone]): At least 20 percent but less than 40 percent impaired, limited or restricted Mobility: Walking and Moving Around Goal Status [phone]): At least 1 percent but less than 20 percent impaired, limited or restricted    Manfred Arch, SPT  Manfred Arch 07/22/2017, 5:09 PM

## 2017-07-23 DIAGNOSIS — E43 Unspecified severe protein-calorie malnutrition: Secondary | ICD-10-CM | POA: Insufficient documentation

## 2017-07-23 LAB — CBC
HCT: 25.7 % — ABNORMAL LOW (ref 35.0–47.0)
Hemoglobin: 8.4 g/dL — ABNORMAL LOW (ref 12.0–16.0)
MCH: 28.3 pg (ref 26.0–34.0)
MCHC: 32.7 g/dL (ref 32.0–36.0)
MCV: 86.3 fL (ref 80.0–100.0)
PLATELETS: 205 10*3/uL (ref 150–440)
RBC: 2.98 MIL/uL — AB (ref 3.80–5.20)
RDW: 15 % — AB (ref 11.5–14.5)
WBC: 5.7 10*3/uL (ref 3.6–11.0)

## 2017-07-23 LAB — BASIC METABOLIC PANEL
ANION GAP: 5 (ref 5–15)
BUN: 17 mg/dL (ref 6–20)
CO2: 23 mmol/L (ref 22–32)
CREATININE: 1.09 mg/dL — AB (ref 0.44–1.00)
Calcium: 7.8 mg/dL — ABNORMAL LOW (ref 8.9–10.3)
Chloride: 115 mmol/L — ABNORMAL HIGH (ref 101–111)
GFR calc Af Amer: >60 mL/min (ref >60)
GFR, EST NON AFRICAN AMERICAN: 53 mL/min — AB (ref >60)
Glucose, Bld: 110 mg/dL — ABNORMAL HIGH (ref 65–99)
POTASSIUM: 3.9 mmol/L (ref 3.5–5.1)
Sodium: 143 mmol/L (ref 135–145)

## 2017-07-23 LAB — TROPONIN I: Troponin I: <0.03 ng/mL (ref <0.03)

## 2017-07-23 LAB — HIV ANTIBODY (ROUTINE TESTING W REFLEX): HIV SCREEN 4TH GENERATION: NONREACTIVE

## 2017-07-23 MED ORDER — DIPHENHYDRAMINE HCL 25 MG PO CAPS
25.0000 mg | ORAL_CAPSULE | Freq: Four times a day (QID) | ORAL | Status: DC | PRN
  Administered 2017-07-23: 25 mg via ORAL
  Filled 2017-07-23: qty 1

## 2017-07-23 MED ORDER — ENOXAPARIN SODIUM 40 MG/0.4ML ~~LOC~~ SOLN
40.0000 mg | SUBCUTANEOUS | Status: DC
  Filled 2017-07-23: qty 0.4

## 2017-07-23 MED ORDER — ENSURE ENLIVE PO LIQD
237.0000 mL | Freq: Three times a day (TID) | ORAL | Status: DC
  Administered 2017-07-23 – 2017-07-24 (×4): 237 mL via ORAL

## 2017-07-23 NOTE — Progress Notes (Signed)
Lovenox changed to 40 mg daily for BMI <40 and CrCl >30. 

## 2017-07-23 NOTE — Progress Notes (Addendum)
Initial Nutrition Assessment  DOCUMENTATION CODES:   Severe malnutrition in context of acute illness/injury  INTERVENTION:   Ensure Enlive po TID, each supplement provides 350 kcal and 20 grams of protein  MVI  Liberalize diet  NUTRITION DIAGNOSIS:   Malnutrition (severe) related to poor appetite, acute illness as evidenced by severe depletion of body fat, severe depletion of muscle mass, 11 percent weight loss in < 3 months.  GOAL:   Patient will meet greater than or equal to 90% of their needs  MONITOR:   PO intake, Supplement acceptance, Weight trends, Labs, I & O's  REASON FOR ASSESSMENT:   Other (Comment) (low BMI)    ASSESSMENT:   61 y.o. female with a history of frequent falls, TIA, hypothyroidism who presents IVC'ed by [employer] for AMS and multiple falls.   Met with pt in room today. Pt reports poor appetite and oral intake for the past several months. Per chart, pt has lost 14lbs(11%) in < 3 months; this is severe. Pt attributes her weight loss to having the flu 3 months ago, severe anxiety over her son's girlfriend, inability to afford Ensure, and not having her bottom dentures. Apparently, pt accidentally flushed her bottom dentures down the toilet and insurance will not pay for a new pair. Pt reports multiple falls lately which could be secondary to her poor nutritional status. Pt also reports that her hair has been falling out for several months and that her nails break easily. RD suspects this is from severe protein calorie malnutrition. Pt reports that because it is hard for her to chew, she does not eat a lot of meat. She does report that she drinks Ensure, but can not afford this at home. RD will liberalize pt's diet as a heart healthy diet restricts quite a bit of meat. RD will add Ensure. Pt was offered dysphagia 3 diet but declined. Pt ate 75% of her breakfast this morning. Pt may benefit from Psychiatry consult to help her manage her anxiety.     Medications  reviewed and include: aspirin, Vit D, lovenox, synthroid, MVI, NaCl %68m/hr, ceftriaxone, morphine, tramadol  Labs reviewed: Cl 115(H), creat 1.09(H), Ca 7.8(L) adj. 8.28(L), alb 3.4(L) Hgb 8.4(L), Hct 25.7(L)  Nutrition-Focused physical exam completed. Findings are severe fat and muscle depletions over entire body, and no edema. Pt noted to have some dry, thinning hair.   Diet Order:  Diet regular Room service appropriate? Yes; Fluid consistency: Thin  Skin:  Reviewed, no issues  Last BM:  10/15  Height:   Ht Readings from Last 1 Encounters:  07/22/17 _0  (1.702 m)    Weight:   Wt Readings from Last 1 Encounters:  07/22/17 111 lb 1.6 oz (50.4 kg)    Ideal Body Weight:  61.3 kg  BMI:  Body mass index is 17.4 kg/m.  Estimated Nutritional Needs:   Kcal:  1500-1700kcal/day   Protein:  76-85g/day   Fluid:  >1.5L/day   EDUCATION NEEDS:   Education needs addressed  CKoleen DistanceMS, RD, LDN Pager #[phone] Hours Pager: [phone]

## 2017-07-23 NOTE — Progress Notes (Signed)
Physical Therapy Treatment Patient Details Name: Diana West MRN: [id number] DOB: Feb 03, 1956 Today's Date: 07/23/2017    History of Present Illness Pt admitted for acute cystitis, pt was reported to be found on floor, was confused. Pt had recently seen neurologist for recurrent syncope/falls. PMH of hypothyroidism, cardiac arrhythmias, and recurrent falls at home. CT, chest, and MRI imaging are negative.     PT Comments    Pt is making progress towards goals. Pt performed standing there-ex, bed mobility with mod I., transfers and amb with RW with CGA. Pt appeared impulsive throughout session, required frequent reminders to slow down. Pt educated on getting out of bed and up from chair slowly. Pt amb one lap around nurse's station, required cues to slow down pace and to be aware of surrounding environment to safely navigate RW in hallway. Assisted pt to bathroom for toileting. No dizziness or LOB reported for all activities. Continue to progress.    Follow Up Recommendations  Home health PT     Equipment Recommendations  Rolling walker with 5" wheels    Recommendations for Other Services       Precautions / Restrictions Precautions Precautions: Fall Restrictions Weight Bearing Restrictions: No    Mobility  Bed Mobility Overal bed mobility: Needs Assistance Bed Mobility: Supine to Sit     Supine to sit: Modified independent (Device/Increase time)     General bed mobility comments: Pt utilizes bed rails, able to scoot to EOB to seated with proper technique, no cues needed. Pt is impulsive, cued to get OOB slowly.   Transfers Overall transfer level: Needs assistance Equipment used: Rolling walker (2 wheeled) Transfers: Sit to/from Stand Sit to Stand: Supervision         General transfer comment: Pt able to transfer seated EOB to standing with RW with proper technique, no cues needed. No dizziness  Ambulation/Gait Ambulation/Gait assistance: Min guard Ambulation  Distance (Feet): 250 Feet Assistive device: Rolling walker (2 wheeled) Gait Pattern/deviations: Step-through pattern     General Gait Details: Pt amb with reciprocal alternating gait, pt appears impulsive and requires cues to be aware of surroundings to navigate RW in hallway in a straight path. Pt appears unsteady on feet when quickly making turns. No LOB or dizziness.    Stairs            Wheelchair Mobility    Modified Rankin (Stroke Patients Only)       Balance Overall balance assessment: Needs assistance Sitting-balance support: Feet supported Sitting balance-Leahy Scale: Good Sitting balance - Comments: Pt able to maintain seated position at EOB, no cues or assist needed   Standing balance support: Bilateral upper extremity supported Standing balance-Leahy Scale: Good Standing balance comment: Pt able to stand with RW and maintain position to perform standing ther-ex. No LOB.                             Cognition Arousal/Alertness: Awake/alert Behavior During Therapy: WFL for tasks assessed/performed Overall Cognitive Status: Within Functional Limits for tasks assessed                                        Exercises Other Exercises Other Exercises: Ther-ex 15x, standing hip marches, standing hip abd/add, shoulder raises, bicep curls, air punches. Cues to slow down tempo and for proper technique.  Other Exercises: Assisted pt with toileting, pt  amb to bathroom with RW, able to transfer to/from toilet with supervision    General Comments        Pertinent Vitals/Pain Pain Assessment: 0-10 Pain Score: 4  Pain Location: chest Pain Descriptors / Indicators: Sore Pain Intervention(s): Limited activity within patient's tolerance;Monitored during session;Patient requesting pain meds-RN notified;Repositioned    Home Living                      Prior Function            PT Goals (current goals can now be found in the care  plan section) Acute Rehab PT Goals Patient Stated Goal: "to figure out why I pass out, and to get stronger" PT Goal Formulation: With patient Time For Goal Achievement: 08/05/17 Potential to Achieve Goals: Good Progress towards PT goals: Progressing toward goals    Frequency    Min 2X/week      PT Plan Current plan remains appropriate    Co-evaluation              AM-PAC PT "6 Clicks" Daily Activity  Outcome Measure  Difficulty turning over in bed (including adjusting bedclothes, sheets and blankets)?: None Difficulty moving from lying on back to sitting on the side of the bed? : None Difficulty sitting down on and standing up from a chair with arms (e.g., wheelchair, bedside commode, etc,.)?: None Help needed moving to and from a bed to chair (including a wheelchair)?: None Help needed walking in hospital room?: A Little Help needed climbing 3-5 steps with a railing? : A Little 6 Click Score: 22    End of Session Equipment Utilized During Treatment: Gait belt Activity Tolerance: Patient tolerated treatment well Patient left: in chair;with call bell/phone within reach;with chair alarm set Nurse Communication: Other (comment) (Pt requests for pain meds prior to session) PT Visit Diagnosis: Other abnormalities of gait and mobility (R26.89);Muscle weakness (generalized) (M62.81);Pain     Time: 4098-1191 PT Time Calculation (min) (ACUTE ONLY): 24 min  Charges:                       G Codes:  Functional Assessment Tool Used: AM-PAC 6 Clicks Basic Mobility Functional Limitation: Mobility: Walking and moving around Mobility: Walking and Moving Around Current Status (Y7829): At least 20 percent but less than 40 percent impaired, limited or restricted Mobility: Walking and Moving Around Goal Status [id number]): At least 1 percent but less than 20 percent impaired, limited or restricted    Diana West, SPT   Diana West 07/23/2017, 4:09 PM

## 2017-07-23 NOTE — Progress Notes (Signed)
[place] [facility] [place] NAME: Diana West    MR#:  [id number]  DATE OF BIRTH:  Nov 08, 1955  SUBJECTIVE:   C/o weakness and fall at home. Eating lunch REVIEW OF SYSTEMS:   Review of Systems  Constitutional: Negative for chills, fever and weight loss.  HENT: Negative for ear discharge, ear pain and nosebleeds.   Eyes: Negative for blurred vision, pain and discharge.  Respiratory: Negative for sputum production, shortness of breath, wheezing and stridor.   Cardiovascular: Negative for chest pain, palpitations, orthopnea and PND.  Gastrointestinal: Negative for abdominal pain, diarrhea, nausea and vomiting.  Genitourinary: Negative for frequency and urgency.  Musculoskeletal: Negative for back pain and joint pain.  Neurological: Negative for sensory change, speech change, focal weakness and weakness.  Psychiatric/Behavioral: Negative for depression and hallucinations. The patient is not nervous/anxious.    Tolerating Diet:yes Tolerating PT: HHPT  DRUG ALLERGIES:   Allergies  Allergen Reactions  . Codeine Itching  . Toradol [Ketorolac Tromethamine] Other (See Comments)    VITALS:  Blood pressure 126/67, pulse 85, temperature (!) 96.6 F (35.9 C), temperature source Axillary, resp. rate 16, height 5\' 7"  (1.702 m), weight 50.4 kg (111 lb 1.6 oz), SpO2 100 %.  PHYSICAL EXAMINATION:   Physical Exam  GENERAL:  61 y.o.-year-old patient lying in the bed with no acute distress. Thin cachectic EYES: Pupils equal, round, reactive to light and accommodation. No scleral icterus. Extraocular muscles intact.  HEENT: Head atraumatic, normocephalic. Oropharynx and nasopharynx clear. Facial bruise NECK:  Supple, no jugular venous distention. No thyroid enlargement, no tenderness.  LUNGS: Normal breath sounds bilaterally, no wheezing, rales, rhonchi. No use of accessory muscles of respiration.  CARDIOVASCULAR: S1, S2 normal. No murmurs, rubs,  or gallops.  ABDOMEN: Soft, nontender, nondistended. Bowel sounds present. No organomegaly or mass.  EXTREMITIES: No cyanosis, clubbing or edema b/l.    NEUROLOGIC: Cranial nerves II through XII are intact. No focal Motor or sensory deficits b/l.   PSYCHIATRIC:  patient is alert and oriented x 3.  SKIN: No obvious rash, lesion, or ulcer.   LABORATORY PANEL:  CBC  Recent Labs Lab 07/23/17 0146  WBC 5.7  HGB 8.4*  HCT 25.7*  PLT 205    Chemistries   Recent Labs Lab 07/22/17 0923 07/23/17 0146  NA 141 143  K 2.9* 3.9  CL 107 115*  CO2 23 23  GLUCOSE 123* 110*  BUN 20 17  CREATININE 1.82* 1.09*  CALCIUM 8.4* 7.8*  AST 22  --   ALT 11*  --   ALKPHOS 69  --   BILITOT 0.4  --    Cardiac Enzymes  Recent Labs Lab 07/23/17 0146  TROPONINI <0.03   RADIOLOGY:  Ct Head Wo Contrast  Result Date: 07/22/2017 CLINICAL DATA:  Pt fell this morning and bruised left forehead and left eye, also bilateral neck pain EXAM: CT HEAD WITHOUT CONTRAST CT CERVICAL SPINE WITHOUT CONTRAST TECHNIQUE: Multidetector CT imaging of the head and cervical spine was performed following the standard protocol without intravenous contrast. Multiplanar CT image reconstructions of the cervical spine were also generated. COMPARISON:  None. FINDINGS: CT HEAD FINDINGS Brain: No evidence of acute infarction, hemorrhage, extra-axial collection, ventriculomegaly, or mass effect. Generalized cerebral atrophy. Periventricular white matter low attenuation likely secondary to microangiopathy. Vascular: Cerebrovascular atherosclerotic calcifications are noted. Skull: Negative for fracture or focal lesion. Sinuses/Orbits: Visualized portions of the orbits are unremarkable. Visualized portions of the paranasal sinuses and mastoid air cells  are unremarkable. Other: None. CT CERVICAL SPINE FINDINGS Alignment: 2 mm retrolisthesis of C5 on C6. 2 mm anterolisthesis of C7 on T1. Loss the normal cervical lordosis with mild kyphosis  centered at C5. Skull base and vertebrae: No acute fracture. No primary bone lesion or focal pathologic process. Soft tissues and spinal canal: No prevertebral fluid or swelling. No visible canal hematoma. Disc levels: Degenerative disc disease with disc height loss at C4-5 and C5-6. Severe left facet arthropathy at C2-3 with mild left foraminal stenosis. Severe right facet arthropathy at C3-4 with right foraminal stenosis. Broad-based disc osteophyte complex and bilateral uncovertebral degenerative changes at C4-5. Broad-based disc osteophyte complex at C5-6 with bilateral uncovertebral degenerative changes and mild bilateral foraminal narrowing. Severe bilateral facet arthropathy at C7-T1. Upper chest: Lung apices are clear. Other: No fluid collection or hematoma. IMPRESSION: 1. No acute intracranial pathology. 2. No acute osseous injury of the cervical spine. Electronically Signed   By: Kathreen Devoid   On: 07/22/2017 10:08   Ct Cervical Spine Wo Contrast  Result Date: 07/22/2017 CLINICAL DATA:  Pt fell this morning and bruised left forehead and left eye, also bilateral neck pain EXAM: CT HEAD WITHOUT CONTRAST CT CERVICAL SPINE WITHOUT CONTRAST TECHNIQUE: Multidetector CT imaging of the head and cervical spine was performed following the standard protocol without intravenous contrast. Multiplanar CT image reconstructions of the cervical spine were also generated. COMPARISON:  None. FINDINGS: CT HEAD FINDINGS Brain: No evidence of acute infarction, hemorrhage, extra-axial collection, ventriculomegaly, or mass effect. Generalized cerebral atrophy. Periventricular white matter low attenuation likely secondary to microangiopathy. Vascular: Cerebrovascular atherosclerotic calcifications are noted. Skull: Negative for fracture or focal lesion. Sinuses/Orbits: Visualized portions of the orbits are unremarkable. Visualized portions of the paranasal sinuses and mastoid air cells are unremarkable. Other: None. CT  CERVICAL SPINE FINDINGS Alignment: 2 mm retrolisthesis of C5 on C6. 2 mm anterolisthesis of C7 on T1. Loss the normal cervical lordosis with mild kyphosis centered at C5. Skull base and vertebrae: No acute fracture. No primary bone lesion or focal pathologic process. Soft tissues and spinal canal: No prevertebral fluid or swelling. No visible canal hematoma. Disc levels: Degenerative disc disease with disc height loss at C4-5 and C5-6. Severe left facet arthropathy at C2-3 with mild left foraminal stenosis. Severe right facet arthropathy at C3-4 with right foraminal stenosis. Broad-based disc osteophyte complex and bilateral uncovertebral degenerative changes at C4-5. Broad-based disc osteophyte complex at C5-6 with bilateral uncovertebral degenerative changes and mild bilateral foraminal narrowing. Severe bilateral facet arthropathy at C7-T1. Upper chest: Lung apices are clear. Other: No fluid collection or hematoma. IMPRESSION: 1. No acute intracranial pathology. 2. No acute osseous injury of the cervical spine. Electronically Signed   By: Kathreen Devoid   On: 07/22/2017 10:08   Mr Brain W Wo Contrast  Result Date: 07/22/2017 CLINICAL DATA:  Encephalopathy EXAM: MRI HEAD WITHOUT AND WITH CONTRAST TECHNIQUE: Multiplanar, multiecho pulse sequences of the brain and surrounding structures were obtained without and with intravenous contrast. CONTRAST:  66mL MULTIHANCE GADOBENATE DIMEGLUMINE 529 MG/ML IV SOLN COMPARISON:  CT head 07/22/2017 FINDINGS: Brain: No acute infarction, hemorrhage, hydrocephalus, extra-axial collection or mass lesion. Normal enhancement postcontrast infusion. Vascular: Normal arterial flow void Skull and upper cervical spine: Negative Sinuses/Orbits: Negative Other: None IMPRESSION: Negative MRI head with contrast. Electronically Signed   By: Franchot Gallo M.D.   On: 07/22/2017 13:18   US Carotid Bilateral  Result Date: 07/23/2017 CLINICAL DATA:  Syncope, encephalopathy and visual  disturbance. EXAM: BILATERAL  CAROTID DUPLEX ULTRASOUND TECHNIQUE: Pearline Cables scale imaging, color Doppler and duplex ultrasound were performed of bilateral carotid and vertebral arteries in the neck. COMPARISON:  None. FINDINGS: Criteria: Quantification of carotid stenosis is based on velocity parameters that correlate the residual internal carotid diameter with NASCET-based stenosis levels, using the diameter of the distal internal carotid lumen as the denominator for stenosis measurement. The following velocity measurements were obtained: RIGHT ICA:  69/26 cm/sec CCA:  66/44 cm/sec SYSTOLIC ICA/CCA RATIO:  0.8 DIASTOLIC ICA/CCA RATIO:  1.7 ECA:  100 cm/sec LEFT ICA:  67/26 cm/sec CCA:  034/74 cm/sec SYSTOLIC ICA/CCA RATIO:  0.6 DIASTOLIC ICA/CCA RATIO:  1.3 ECA:  41 cm/sec RIGHT CAROTID ARTERY: There is a minimal amount noncalcified plaque at the level of the carotid bulb. No evidence of ICA plaque or stenosis. The internal carotid artery is mildly tortuous in the neck. RIGHT VERTEBRAL ARTERY: Antegrade flow with normal waveform and velocity. LEFT CAROTID ARTERY: There is a mild amount of noncalcified plaque in the common carotid artery and carotid bulb. No evidence of ICA plaque or stenosis. The left internal carotid artery is tortuous. LEFT VERTEBRAL ARTERY: Antegrade flow with normal waveform and velocity. IMPRESSION: Mild amount of noncalcified plaque at the level of the left common carotid artery and both carotid bulbs. No evidence of ICA plaque or stenosis bilaterally. Electronically Signed   By: Aletta Edouard M.D.   On: 07/23/2017 07:38   Dg Chest Portable 1 View  Result Date: 07/22/2017 CLINICAL DATA:  Fever.  History of TIA and multiple falls. EXAM: PORTABLE CHEST 1 VIEW COMPARISON:  No recent prior. FINDINGS: Mediastinum hilar structures normal. Lungs are clear. No focal infiltrate. No pleural effusion or pneumothorax. Degenerative changes thoracic spine. IMPRESSION: No acute cardiopulmonary disease.  Electronically Signed   By: Marcello Moores  Register   On: 07/22/2017 09:44   Dg Shoulder Left  Result Date: 07/22/2017 CLINICAL DATA:  Multiple falls. Left shoulder pain. Initial encounter. EXAM: LEFT SHOULDER - 2+ VIEW COMPARISON:  None. FINDINGS: There is no evidence of fracture or dislocation. There is no evidence of arthropathy or other focal bone abnormality. Soft tissues are unremarkable. IMPRESSION: Negative exam. Electronically Signed   By: Inge Rise M.D.   On: 07/22/2017 10:21   ASSESSMENT AND PLAN:  Diana West  is a 61 y.o. female with a known history of hypothyroidism, recurrent falls at home, cardiac arrhythmia presents to hospital secondary to a fall and confusion.  #1 acute metabolic encephalopathy-secondary to acute cystitis. -received IV fluids, IV antibiotics--rocephin -CT of the head negative for any acute findings and admission -MRI brain neg -US carotid neg -Telemetry NSR -pt had extensive Neuro eval as out pt  #2 recurrent falls-seeing neurology as outpatient. EEG was normal -MRI brain negative  #3 acute cystitis-follow-up urine cultures -Started on Rocephin  #4 hypokalemia-eating replace.  #5 acute renal failure on CK D-Baseline creatinine seems to be around 1.2, continue IV fluids -Avoid nephrotoxins  #6 hypothyroidism-continue Synthroid. TSH seems to be low, check free T4  #7 DVT prophylaxis-weight adjusted Lovenox  #8 Depression/anxiety--cont home meds. Pt has out pt psych she follows  D/c home in am if remains stable Case discussed with Care Management/Social Worker. Management plans discussed with the patient, family and they are in agreement.  CODE STATUS: FULL  DVT Prophylaxis: lovenox  TOTAL TIME TAKING CARE OF THIS PATIENT: *25* minutes.  >50% time spent on counselling and coordination of care  POSSIBLE D/C IN **1 DAYS, DEPENDING ON CLINICAL CONDITION.  Note: This  dictation was prepared with Dragon dictation along with smaller  phrase technology. Any transcriptional errors that result from this process are unintentional.  Brylin Stopper M.D on 07/23/2017 at 6:13 PM  Between 7am to 6pm - Pager - [phone]  After 6pm go to [email or web address] - password [email or web address] [facility] [place]  Office  [phone]  CC: Primary care physician; Adline Potter, MDPatient ID: Diana West, female   DOB: 1956-02-01, 61 y.o.   MRN: [id number]

## 2017-07-24 ENCOUNTER — Telehealth: Payer: Self-pay

## 2017-07-24 LAB — URINE CULTURE: Culture: >=100000 — AB

## 2017-07-24 MED ORDER — CEPHALEXIN 500 MG PO CAPS: 500.0000 mg | ORAL_CAPSULE | Freq: Two times a day (BID) | ORAL | 0 refills | Status: DC

## 2017-07-24 MED ORDER — CEPHALEXIN 500 MG PO CAPS
500.0000 mg | ORAL_CAPSULE | Freq: Two times a day (BID) | ORAL | Status: DC
  Administered 2017-07-24: 500 mg via ORAL
  Filled 2017-07-24: qty 1

## 2017-07-24 NOTE — Discharge Summary (Signed)
[facility] NAME: Diana West    MR#:  [id number]  DATE OF BIRTH:  1956/06/12  DATE OF ADMISSION:  07/22/2017 ADMITTING PHYSICIAN: Gladstone Lighter, MD  DATE OF DISCHARGE: 07/24/2017  PRIMARY CARE PHYSICIAN: Adline Potter, MD    ADMISSION DIAGNOSIS:  Syncope [R55] Encephalopathy acute [G93.40] Acute cystitis without hematuria [N30.00] AKI (acute kidney injury) ([place]) [N17.9]  DISCHARGE DIAGNOSIS:  Recurrent falls--unclear etiology. Cardiac and neurology w/u negative UTI-Ecoli Malnutrition-severe Chronic anxiety  SECONDARY DIAGNOSIS:   Past Medical History:  Diagnosis Date  . Anxiety   . Arthritis   . Cardiac arrhythmia   . Falls frequently   . Palpitation   . Shingles    she states "they are inside and not contagious"  . TIA (transient ischemic attack)     HOSPITAL COURSE:  Diana West a 61 y.o. femalewith a known history of hypothyroidism, recurrent falls at home, cardiac arrhythmia presents to hospital secondary to a fall and confusion.  #1 recurrent falls with mild AMS - suspected this time due to secondary to acute cystitis. -received IV fluids, IV antibiotics--rocephin--keflex -CT of the head negative for any acute findings -MRI brain neg -US carotid neg -Telemetry NSR -pt had extensive Neuro eval as out pt   #2 recurrent falls-seeing neurology as outpatient. EEG was normal -MRI brain negative -spoke with Arbie Cookey Dr Dwain Sarna (cardiology in [place]) she was found to have PVC in 12/2015--tired amiodarone and verapamil but stopped due to S/E -telemetry here NSR.  -5 EKG's since July 2018 NSR -repeated EKG today--NSR -at this point I have explained to the pt that she can go to Cardiology here as out pt if she wants to.   #3 acute cystitis-follow-up urine cultures -Started on Rocephin---now on po abxs  #4 hypokalemia-eating replace.  #5 acute renal failure on CK D-Baseline  creatinine seems to be around 1.2,  #6 hypothyroidism-continue Synthroid. TSH seems to be low, check free T4  #7 DVT prophylaxis-weight adjusted Lovenox  #8 Depression/anxiety--cont home meds. Pt has out pt psych she follows  Overall stable for D/c to Home with HHPT Pt voiced understanding  CONSULTS OBTAINED:    DRUG ALLERGIES:   Allergies  Allergen Reactions  . Codeine Itching  . Toradol [Ketorolac Tromethamine] Other (See Comments)    DISCHARGE MEDICATIONS:   Current Discharge Medication List    START taking these medications   Details  cephALEXin (KEFLEX) 500 MG capsule Take 1 capsule (500 mg total) by mouth every 12 (twelve) hours. Qty: 12 capsule, Refills: 0      CONTINUE these medications which have NOT CHANGED   Details  ALPRAZolam (XANAX) 1 MG tablet Take 1 mg by mouth 3 (three) times daily.     aspirin 81 MG tablet Take 1 tablet (81 mg total) by mouth daily. Qty: 30 tablet    Cholecalciferol (VITAMIN D-3) 1000 units CAPS Take 1 capsule (1,000 Units total) by mouth daily. Qty: 60 capsule    gabapentin (NEURONTIN) 600 MG tablet Take 1 tablet (600 mg total) by mouth 3 (three) times daily. Qty: 90 tablet, Refills: 2    levothyroxine (SYNTHROID, LEVOTHROID) 100 MCG tablet Take 1 tablet (100 mcg total) by mouth daily before breakfast. Qty: 30 tablet, Refills: 2    Multiple Vitamin (MULTIVITAMIN) capsule Take 1 capsule by mouth daily.    sertraline (ZOLOFT) 100 MG tablet Take 1 tablet by mouth daily.    acetaminophen (TYLENOL) 500 MG tablet Take 2 tablets (1,000 mg  total) by mouth 2 (two) times daily. Refills: 0    traMADol (ULTRAM) 50 MG tablet Take 1 tablet (50 mg total) by mouth every 6 (six) hours as needed. Qty: 30 tablet, Refills: 0        If you experience worsening of your admission symptoms, develop shortness of breath, life threatening emergency, suicidal or homicidal thoughts you must seek medical attention immediately by calling 911 or  calling your MD immediately  if symptoms less severe.  You Must read complete instructions/literature along with all the possible adverse reactions/side effects for all the Medicines you take and that have been prescribed to you. Take any new Medicines after you have completely understood and accept all the possible adverse reactions/side effects.   Please note  You were cared for by a hospitalist during your hospital stay. If you have any questions about your discharge medications or the care you received while you were in the hospital after you are discharged, you can call the unit and asked to speak with the hospitalist on call if the hospitalist that took care of you is not available. Once you are discharged, your primary care physician will handle any further medical issues. Please note that NO REFILLS for any discharge medications will be authorized once you are discharged, as it is imperative that you return to your primary care physician (or establish a relationship with a primary care physician if you do not have one) for your aftercare needs so that they can reassess your need for medications and monitor your lab values. Today   SUBJECTIVE   No new complaints  VITAL SIGNS:  Blood pressure (!) 123/52, pulse 85, temperature 98.7 F (37.1 C), temperature source Oral, resp. rate 18, height 5\' 7"  (1.702 m), weight 50.4 kg (111 lb 1.6 oz), SpO2 96 %.  I/O:   Intake/Output Summary (Last 24 hours) at 07/24/17 1132 Last data filed at 07/24/17 0953  Gross per 24 hour  Intake             1176 ml  Output             3750 ml  Net            -2574 ml    PHYSICAL EXAMINATION:  GENERAL:  61 y.o.-year-old patient lying in the bed with no acute distress.  EYES: Pupils equal, round, reactive to light and accommodation. No scleral icterus. Extraocular muscles intact.  HEENT: Head atraumatic, normocephalic. Oropharynx and nasopharynx clear.  NECK:  Supple, no jugular venous distention. No thyroid  enlargement, no tenderness.  LUNGS: Normal breath sounds bilaterally, no wheezing, rales,rhonchi or crepitation. No use of accessory muscles of respiration.  CARDIOVASCULAR: S1, S2 normal. No murmurs, rubs, or gallops.  ABDOMEN: Soft, non-tender, non-distended. Bowel sounds present. No organomegaly or mass.  EXTREMITIES: No pedal edema, cyanosis, or clubbing.  NEUROLOGIC: Cranial nerves II through XII are intact. Muscle strength 5/5 in all extremities. Sensation intact. Gait not checked.  PSYCHIATRIC: The patient is alert and oriented x 3.  SKIN: No obvious rash, lesion, or ulcer.   DATA REVIEW:   CBC   Recent Labs Lab 07/23/17 0146  WBC 5.7  HGB 8.4*  HCT 25.7*  PLT 205    Chemistries   Recent Labs Lab 07/22/17 0923 07/23/17 0146  NA 141 143  K 2.9* 3.9  CL 107 115*  CO2 23 23  GLUCOSE 123* 110*  BUN 20 17  CREATININE 1.82* 1.09*  CALCIUM 8.4* 7.8*  AST 22  --  ALT 11*  --   ALKPHOS 69  --   BILITOT 0.4  --     Microbiology Results   Recent Results (from the past 240 hour(s))  Urine Culture     Status: Abnormal   Collection Time: 07/22/17 10:30 AM  Result Value Ref Range Status   Specimen Description URINE, RANDOM  Final   Special Requests NONE  Final   Culture >=100,000 COLONIES/mL ESCHERICHIA COLI (A)  Final   Report Status 07/24/2017 FINAL  Final   Organism ID, Bacteria ESCHERICHIA COLI (A)  Final      Susceptibility   Escherichia coli - MIC*    AMPICILLIN >=32 RESISTANT Resistant     CEFAZOLIN <=4 SENSITIVE Sensitive     CEFTRIAXONE <=1 SENSITIVE Sensitive     CIPROFLOXACIN >=4 RESISTANT Resistant     GENTAMICIN <=1 SENSITIVE Sensitive     IMIPENEM <=0.25 SENSITIVE Sensitive     NITROFURANTOIN <=16 SENSITIVE Sensitive     TRIMETH/SULFA >=320 RESISTANT Resistant     AMPICILLIN/SULBACTAM >=32 RESISTANT Resistant     PIP/TAZO >=128 RESISTANT Resistant     Extended ESBL NEGATIVE Sensitive     * >=100,000 COLONIES/mL ESCHERICHIA COLI    RADIOLOGY:   Mr Jeri Cos Wo Contrast  Result Date: 07/22/2017 CLINICAL DATA:  Encephalopathy EXAM: MRI HEAD WITHOUT AND WITH CONTRAST TECHNIQUE: Multiplanar, multiecho pulse sequences of the brain and surrounding structures were obtained without and with intravenous contrast. CONTRAST:  69mL MULTIHANCE GADOBENATE DIMEGLUMINE 529 MG/ML IV SOLN COMPARISON:  CT head 07/22/2017 FINDINGS: Brain: No acute infarction, hemorrhage, hydrocephalus, extra-axial collection or mass lesion. Normal enhancement postcontrast infusion. Vascular: Normal arterial flow void Skull and upper cervical spine: Negative Sinuses/Orbits: Negative Other: None IMPRESSION: Negative MRI head with contrast. Electronically Signed   By: Franchot Gallo M.D.   On: 07/22/2017 13:18   US Carotid Bilateral  Result Date: 07/23/2017 CLINICAL DATA:  Syncope, encephalopathy and visual disturbance. EXAM: BILATERAL CAROTID DUPLEX ULTRASOUND TECHNIQUE: Pearline Cables scale imaging, color Doppler and duplex ultrasound were performed of bilateral carotid and vertebral arteries in the neck. COMPARISON:  None. FINDINGS: Criteria: Quantification of carotid stenosis is based on velocity parameters that correlate the residual internal carotid diameter with NASCET-based stenosis levels, using the diameter of the distal internal carotid lumen as the denominator for stenosis measurement. The following velocity measurements were obtained: RIGHT ICA:  69/26 cm/sec CCA:  27/06 cm/sec SYSTOLIC ICA/CCA RATIO:  0.8 DIASTOLIC ICA/CCA RATIO:  1.7 ECA:  100 cm/sec LEFT ICA:  67/26 cm/sec CCA:  237/62 cm/sec SYSTOLIC ICA/CCA RATIO:  0.6 DIASTOLIC ICA/CCA RATIO:  1.3 ECA:  41 cm/sec RIGHT CAROTID ARTERY: There is a minimal amount noncalcified plaque at the level of the carotid bulb. No evidence of ICA plaque or stenosis. The internal carotid artery is mildly tortuous in the neck. RIGHT VERTEBRAL ARTERY: Antegrade flow with normal waveform and velocity. LEFT CAROTID ARTERY: There is a mild amount of  noncalcified plaque in the common carotid artery and carotid bulb. No evidence of ICA plaque or stenosis. The left internal carotid artery is tortuous. LEFT VERTEBRAL ARTERY: Antegrade flow with normal waveform and velocity. IMPRESSION: Mild amount of noncalcified plaque at the level of the left common carotid artery and both carotid bulbs. No evidence of ICA plaque or stenosis bilaterally. Electronically Signed   By: Aletta Edouard M.D.   On: 07/23/2017 07:38     Management plans discussed with the patient, family and they are in agreement.  CODE STATUS:  Code Status Orders        Start     Ordered   07/22/17 1354  Full code  Continuous     07/22/17 1353    Code Status History    Date Active Date Inactive Code Status Order ID Comments User Context   This patient has a current code status but no historical code status.      TOTAL TIME TAKING CARE OF THIS PATIENT: 40 minutes.    Johntavious Francom M.D on 07/24/2017 at 11:32 AM  Between 7am to 6pm - Pager - [phone] After 6pm go to [email or web address] - password [email or web address] [facility]  Office  [phone]  CC: Primary care physician; Adline Potter, MD

## 2017-07-24 NOTE — Progress Notes (Signed)
Pt had a sudden onset of generalized  Itching. Primary nurse paged and spoke to Dr. Tressia Miners. Orders received for PO benadryl. Primary nurse to continue to monitor.

## 2017-07-24 NOTE — Progress Notes (Signed)
Patient IV removed. Belongings gathered. Discharge instructions explained. All questions answered. Called volunteers to wheel patient out to sons car.

## 2017-07-24 NOTE — Care Management (Signed)
Patient discharged home today.  Patient lives at home alone.  Friend lives in the same apartment complex, and son lives locally for support.  PCP [place]. PT has assessed patient and recommends home health PT. Patient provided home health agency.  Patient states that she does not have agency preference.  Referral made to [name] with [facility].  Walker delivered to room by Corene Cornea with [place] signing off.

## 2017-07-25 ENCOUNTER — Telehealth: Payer: Self-pay

## 2017-07-25 ENCOUNTER — Ambulatory Visit: Payer: Medicare (Managed Care) | Admitting: Family Medicine

## 2017-07-25 NOTE — Telephone Encounter (Signed)
error 

## 2017-07-25 NOTE — Telephone Encounter (Signed)
I have made the 1st attempt to contact the patient or family member in charge, in order to follow up from recently being discharged from the hospital. I left a message on voicemail but I will make another attempt at a different time.   Call back number [phone]

## 2017-08-06 ENCOUNTER — Telehealth: Payer: Self-pay

## 2017-08-06 ENCOUNTER — Other Ambulatory Visit: Payer: Self-pay | Admitting: Family Medicine

## 2017-08-06 MED ORDER — TRAMADOL HCL 50 MG PO TABS: 50.0000 mg | ORAL_TABLET | Freq: Four times a day (QID) | ORAL | 0 refills | Status: DC | PRN

## 2017-08-06 NOTE — Telephone Encounter (Signed)
She may be confused I will check again with her as she seemed confused. I did tell her we will not do this again after this.

## 2017-08-06 NOTE — Telephone Encounter (Signed)
Had another Fall and is hurting and wants refills on Ultram. She has fallen several times. She said she can not keep taking Tylenol it is hurting stomach. Patient crying and very upset saying she is pain where she can not even get  up and fix food for herself the neighbor is having to come by. [phone].

## 2017-08-06 NOTE — Telephone Encounter (Signed)
I don't see any in the system. We need to be careful about prescribing further tramadol given her history of chronic opioid use.

## 2017-08-06 NOTE — Telephone Encounter (Signed)
She has appointments set up for both

## 2017-08-06 NOTE — Telephone Encounter (Signed)
Will refill tramadol but can contribute to falls. Has she followed up with cards and neuro?

## 2017-08-07 NOTE — Telephone Encounter (Signed)
ERROR

## 2017-08-07 NOTE — Telephone Encounter (Signed)
Error

## 2017-08-25 ENCOUNTER — Other Ambulatory Visit: Payer: Self-pay

## 2017-08-25 ENCOUNTER — Ambulatory Visit
Admission: EM | Admit: 2017-08-25 | Discharge: 2017-08-25 | Disposition: A | Discharge status: Home / Self Care | Payer: PRIVATE HEALTH INSURANCE | Attending: Family Medicine | Admitting: Family Medicine

## 2017-08-25 ENCOUNTER — Encounter: Payer: Self-pay | Admitting: Emergency Medicine

## 2017-08-25 DIAGNOSIS — L298 Other pruritus: Secondary | ICD-10-CM | POA: Diagnosis not present

## 2017-08-25 DIAGNOSIS — R21 Rash and other nonspecific skin eruption: Secondary | ICD-10-CM | POA: Diagnosis not present

## 2017-08-25 DIAGNOSIS — L299 Pruritus, unspecified: Secondary | ICD-10-CM

## 2017-08-25 MED ORDER — FLUCONAZOLE 150 MG PO TABS
150.0000 mg | ORAL_TABLET | Freq: Once | ORAL | 0 refills | Status: AC
Start: 2017-08-25 — End: 2017-08-25

## 2017-08-25 MED ORDER — PERMETHRIN 5 % EX CREA: 1.0000 "application " | TOPICAL_CREAM | Freq: Once | CUTANEOUS | 1 refills | Status: AC

## 2017-08-25 MED ORDER — HYDROXYZINE HCL 25 MG PO TABS: 12.5000 mg | ORAL_TABLET | Freq: Three times a day (TID) | ORAL | 0 refills | Status: DC | PRN

## 2017-08-25 NOTE — Discharge Instructions (Signed)
Take medication as prescribed. Avoid itching. Drink plenty of fluids. Use over the counter topical antifungal as discussed. Wash all clothes and bedding well.   Follow up with your primary care physician this week as needed. Return to Urgent care for new or worsening concerns.

## 2017-08-25 NOTE — ED Triage Notes (Signed)
Patient c/o itchy rash on her arms, neck, and legs that started 3 days.

## 2017-08-25 NOTE — ED Provider Notes (Signed)
[facility] ____________________________________________  Time seen: Approximately 2:47 PM  I have reviewed the triage vital signs and the nursing notes.   HISTORY  Chief Complaint Rash   HPI Diana West is a 61 y.o. female presenting for evaluation of itchy rash present to body for the last 3 days.  States rash initially was present to forearms and has since spread over the last few days.  Denies noticing rash present prior to 3 days ago.  Denies any changes in foods, medicines, lotions, detergents or other contacts.  Denies fevers, swelling, pain, insect bite, tick bite or tick attachment, drainage or redness.  States rash is very itchy.  States she does have a history of ringworm with some similar presentation.  Has not tried any alleviating measures except for one Benadryl last night which helped briefly per patient.  Denies known trigger.  Reports otherwise feels well.  Patient reports that she does have a history of recurrent falls and that she has been followed by primary as well as neurology for, denies any recent falls.  Patient again denies other complaints. Denies chest pain, shortness of breath, abdominal pain, extremity pain, extremity swelling.Denies recent sickness. Denies recent antibiotic use.  Patient reports that she does have an indoor cat.  No animal exposure changes.  Adline Potter, MD: PCP   Past Medical History:  Diagnosis Date  . Anxiety   . Arthritis   . Cardiac arrhythmia   . Falls frequently   . Palpitation   . Shingles    she states "they are inside and not contagious"  . TIA (transient ischemic attack)     Patient Active Problem List   Diagnosis Date Noted  . Protein-calorie malnutrition, severe 07/23/2017  . Acute cystitis 07/22/2017  . TIA (transient ischemic attack) 07/06/2017  . Hypothyroidism 06/26/2017  . Hypotension 06/26/2017  . Falls frequently 06/26/2017  . Anemia 06/26/2017  . Depression with anxiety 06/26/2017  . FH:  premature coronary heart disease 06/26/2017  . Lumbar disc disease 06/26/2017  . Cervical disc disease 06/26/2017  . Chronic headaches 06/26/2017  . History of PSVT (paroxysmal supraventricular tachycardia) 06/26/2017  . Tinea corporis 06/26/2017  . Postherpetic neuralgia 06/26/2017  . CKD (chronic kidney disease) stage 3, GFR 30-59 ml/min (HCC) 06/26/2017    Past Surgical History:  Procedure Laterality Date  . ABDOMINAL HYSTERECTOMY    . CESAREAN SECTION    . CHOLECYSTECTOMY       No current facility-administered medications for this encounter.   Current Outpatient Medications:  .  ALPRAZolam (XANAX) 1 MG tablet, Take 1 mg by mouth 3 (three) times daily. , Disp: , Rfl:  .  amitriptyline (ELAVIL) 100 MG tablet, Take 100 mg at bedtime by mouth., Disp: , Rfl:  .  aspirin 81 MG tablet, Take 1 tablet (81 mg total) by mouth daily., Disp: 30 tablet, Rfl:  .  Cholecalciferol (VITAMIN D-3) 1000 units CAPS, Take 1 capsule (1,000 Units total) by mouth daily., Disp: 60 capsule, Rfl:  .  gabapentin (NEURONTIN) 600 MG tablet, Take 1 tablet (600 mg total) by mouth 3 (three) times daily., Disp: 90 tablet, Rfl: 2 .  levothyroxine (SYNTHROID, LEVOTHROID) 100 MCG tablet, Take 1 tablet (100 mcg total) by mouth daily before breakfast., Disp: 30 tablet, Rfl: 2 .  Multiple Vitamin (MULTIVITAMIN) capsule, Take 1 capsule by mouth daily., Disp: , Rfl:  .  sertraline (ZOLOFT) 100 MG tablet, Take 1 tablet by mouth daily., Disp: , Rfl:  .  acetaminophen (TYLENOL) 500 MG  tablet, Take 2 tablets (1,000 mg total) by mouth 2 (two) times daily., Disp: , Rfl: 0 .  fluconazole (DIFLUCAN) 150 MG tablet, Take 1 tablet (150 mg total) once for 1 dose by mouth. Repeat in one week, Disp: 2 tablet, Rfl: 0 .  hydrOXYzine (ATARAX/VISTARIL) 25 MG tablet, Take 0.5 tablets (12.5 mg total) 3 (three) times daily as needed by mouth for itching., Disp: 15 tablet, Rfl: 0 .  permethrin (ELIMITE) 5 % cream, Apply 1 application once for 1  dose topically. Apply to entire body topically once and leave on for 8-14 hours then wash off. Repeat in 1 week. Rx: 2 doses, Disp: 60 g, Rfl: 1  Allergies Codeine and Toradol [ketorolac tromethamine]  Family History  Problem Relation Age of Onset  . CAD Mother   . CAD Father     Social History Social History   Tobacco Use  . Smoking status: Current Some Day Smoker    Packs/day: 0.25  . Smokeless tobacco: Never Used  Substance Use Topics  . Alcohol use: No  . Drug use: No    Review of Systems Constitutional: No fever/chills Cardiovascular: Denies chest pain. Respiratory: Denies shortness of breath. Gastrointestinal: No abdominal pain.  No nausea, no vomiting.   Musculoskeletal: Negative for back pain. Skin: Positive for rash.  ____________________________________________   PHYSICAL EXAM:  VITAL SIGNS: ED Triage Vitals  Enc Vitals Group     BP 08/25/17 1438 122/77     Pulse Rate 08/25/17 1438 85     Resp 08/25/17 1438 14     Temp 08/25/17 1438 98.1 F (36.7 C)     Temp Source 08/25/17 1438 Oral     SpO2 08/25/17 1438 100 %     Weight 08/25/17 1434 114 lb 6.7 oz (51.9 kg)     Height 08/25/17 1434 5\' 7"  (1.702 m)     Head Circumference --      Peak Flow --      Pain Score 08/25/17 1435 0     Pain Loc --      Pain Edu? --      Excl. in [place]? --     Constitutional: Alert and oriented. Well appearing and in no acute distress. Cardiovascular: Normal rate, regular rhythm. Grossly normal heart sounds.  Good peripheral circulation. Respiratory: Normal respiratory effort without tachypnea nor retractions. Breath sounds are clear and equal bilaterally. No wheezes, rales, rhonchi. Gastrointestinal: Soft and nontender.  Musculoskeletal: Steady gait.  Bilateral upper and lower extremities nontender no edema noted. Neurologic:  Normal speech and language.  Speech is normal. No gait instability.  Skin:  Skin is warm, dry. Except: Patient with clustered areas of mildly  erythematous dry papular rash, some areas in circular shapes, others scattered singular lesions with excoriation, patient actively scratching in room, but scattered areas to bilateral forearms, bilateral lower extremities and shoulders, concern for burrow appearance to dorsal fingers and knuckles, no honey crusted lesions, no surrounding erythema, no induration, no fluctuance, nontender. Psychiatric: Mood and affect are normal. Speech and behavior are normal. Patient exhibits appropriate insight and judgment   ___________________________________________   LABS (all labs ordered are listed, but only abnormal results are displayed)  Labs Reviewed - No data to display ____________________________________________   PROCEDURES Procedures    INITIAL IMPRESSION / ASSESSMENT AND PLAN / ED COURSE  Pertinent labs & imaging results that were available during my care of the patient were reviewed by me and considered in my medical decision making (see chart for  details).  Very well-appearing patient.  No acute distress.  Patient denies injury of recurrent rash.  Doubt allergic reaction.  Patient scratching rash and with excoriated appearance, discuss with patient clinical appearance suspicious for scabies versus ringworm that she has been scratching, patient also reports that she is under a lot of stress and discuss anxiety contributing factor to itching.  Patient states that she does have a history of ringworm with some similar appearance.  As patient has a history of falls, will treat patient with half tablet hydroxyzine 3 times a day as needed for itching, will empirically treat patient with topical permethrin as well as oral Diflucan and encouraged over-the-counter topical antifungal.  Encourage patient to stop scratching, washing clothing and bedding, supportive care and monitoring for triggers.  Encouraged follow-up.Discussed indication, risks and benefits of medications with patient including  monitoring and drowsiness.   Discussed follow up with Primary care physician this week. Discussed follow up and return parameters including no resolution or any worsening concerns. Patient verbalized understanding and agreed to plan.   ____________________________________________   FINAL CLINICAL IMPRESSION(S) / ED DIAGNOSES  Final diagnoses:  Rash  Itching     ED Discharge Orders        Ordered    hydrOXYzine (ATARAX/VISTARIL) 25 MG tablet  3 times daily PRN     08/25/17 1511    fluconazole (DIFLUCAN) 150 MG tablet   Once     08/25/17 1511    permethrin (ELIMITE) 5 % cream   Once     08/25/17 1511       Note: This dictation was prepared with Dragon dictation along with smaller phrase technology. Any transcriptional errors that result from this process are unintentional.         Marylene Land, NP 08/25/17 1757

## 2017-08-27 ENCOUNTER — Telehealth: Payer: Self-pay | Admitting: *Deleted

## 2017-08-27 NOTE — Telephone Encounter (Signed)
Patient called asking clarification for the use of permethrin cream that was prescribed during her recent visit. Patient was unsure about applying permethrin to her scalp. I confirmed that she should apply permethrin to her entire body including her scalp. Patient confirmed understanding of information.

## 2017-08-29 ENCOUNTER — Telehealth: Payer: Self-pay | Admitting: *Deleted

## 2017-08-29 NOTE — Telephone Encounter (Signed)
Patient called and reported having a adverse reaction to the pemethrin cream prescribed to her during her last visit to [facility]. PAtient requested a cream that worked for her in the past for the same rash symptom. A prescription for Clotrimazole Betamethasone 1% - 0.05% was called into the [facility] pharmacy in [place] as per Marylene Land NP.

## 2017-09-05 ENCOUNTER — Ambulatory Visit: Payer: Medicare (Managed Care) | Admitting: Family Medicine

## 2017-09-11 ENCOUNTER — Other Ambulatory Visit: Payer: Self-pay | Admitting: Family Medicine

## 2017-10-31 ENCOUNTER — Emergency Department: Payer: Medicare (Managed Care)

## 2017-10-31 ENCOUNTER — Encounter: Payer: Self-pay | Admitting: Emergency Medicine

## 2017-10-31 ENCOUNTER — Emergency Department
Admission: EM | Admit: 2017-10-31 | Discharge: 2017-10-31 | Disposition: A | Discharge status: Home / Self Care | Payer: Medicare (Managed Care) | Attending: Emergency Medicine | Admitting: Emergency Medicine

## 2017-10-31 DIAGNOSIS — F17200 Nicotine dependence, unspecified, uncomplicated: Secondary | ICD-10-CM | POA: Diagnosis not present

## 2017-10-31 DIAGNOSIS — E039 Hypothyroidism, unspecified: Secondary | ICD-10-CM | POA: Diagnosis not present

## 2017-10-31 DIAGNOSIS — R0789 Other chest pain: Secondary | ICD-10-CM

## 2017-10-31 DIAGNOSIS — N183 Chronic kidney disease, stage 3 (moderate): Secondary | ICD-10-CM | POA: Diagnosis not present

## 2017-10-31 DIAGNOSIS — Z7982 Long term (current) use of aspirin: Secondary | ICD-10-CM | POA: Diagnosis not present

## 2017-10-31 DIAGNOSIS — R0781 Pleurodynia: Secondary | ICD-10-CM | POA: Insufficient documentation

## 2017-10-31 DIAGNOSIS — Z79899 Other long term (current) drug therapy: Secondary | ICD-10-CM | POA: Insufficient documentation

## 2017-10-31 DIAGNOSIS — R079 Chest pain, unspecified: Secondary | ICD-10-CM | POA: Diagnosis present

## 2017-10-31 LAB — CBC
HCT: 34.2 % — ABNORMAL LOW (ref 35.0–47.0)
Hemoglobin: 11.4 g/dL — ABNORMAL LOW (ref 12.0–16.0)
MCH: 28.6 pg (ref 26.0–34.0)
MCHC: 33.2 g/dL (ref 32.0–36.0)
MCV: 86.2 fL (ref 80.0–100.0)
PLATELETS: 257 10*3/uL (ref 150–440)
RBC: 3.97 MIL/uL (ref 3.80–5.20)
RDW: 17.8 % — AB (ref 11.5–14.5)
WBC: 7 10*3/uL (ref 3.6–11.0)

## 2017-10-31 LAB — BASIC METABOLIC PANEL
Anion gap: 8 (ref 5–15)
BUN: 17 mg/dL (ref 6–20)
CO2: 24 mmol/L (ref 22–32)
CREATININE: 1.07 mg/dL — AB (ref 0.44–1.00)
Calcium: 9.3 mg/dL (ref 8.9–10.3)
Chloride: 108 mmol/L (ref 101–111)
GFR calc Af Amer: >60 mL/min (ref >60)
GFR, EST NON AFRICAN AMERICAN: 54 mL/min — AB (ref >60)
GLUCOSE: 106 mg/dL — AB (ref 65–99)
Potassium: 3.7 mmol/L (ref 3.5–5.1)
SODIUM: 140 mmol/L (ref 135–145)

## 2017-10-31 LAB — TROPONIN I: Troponin I: <0.03 ng/mL (ref <0.03)

## 2017-10-31 LAB — FIBRIN DERIVATIVES D-DIMER (ARMC ONLY): FIBRIN DERIVATIVES D-DIMER (ARMC): 728.69 ng{FEU}/mL — AB (ref 0.00–499.00)

## 2017-10-31 MED ORDER — IOPAMIDOL (ISOVUE-370) INJECTION 76%
60.0000 mL | Freq: Once | INTRAVENOUS | Status: AC | PRN
  Administered 2017-10-31: 60 mL via INTRAVENOUS

## 2017-10-31 MED ORDER — ALPRAZOLAM 0.5 MG PO TABS
1.0000 mg | ORAL_TABLET | Freq: Once | ORAL | Status: AC
  Administered 2017-10-31: 1 mg via ORAL
  Filled 2017-10-31: qty 2

## 2017-10-31 MED ORDER — ASPIRIN 81 MG PO CHEW
324.0000 mg | CHEWABLE_TABLET | Freq: Once | ORAL | Status: AC
  Administered 2017-10-31: 324 mg via ORAL
  Filled 2017-10-31: qty 4

## 2017-10-31 NOTE — ED Notes (Signed)
NAD noted at time of D/C. Pt denies further questions or concerns at this time. Pt taken to the lobby via wheelchair at this time.

## 2017-10-31 NOTE — ED Notes (Signed)
Report received from [place], RN ?

## 2017-10-31 NOTE — ED Provider Notes (Addendum)
-----------------------------------------   5:11 PM on 10/31/2017 -----------------------------------------  CT angiography is negative for PE.  Positive for cystic lesion in the pancreas as well as a 3 mm lung nodule.  Discussed both these results with the patient and she will follow-up with her doctor for further pancreatic imaging and a repeat chest CT in 12 months.  Patient's workup today is otherwise largely normal.  Normal metabolic panel, normal white blood cell count.  Negative troponin x2.  Patient is receiving extensive follow-up already is seeing a cardiologist primary care doctor as well as a neurologist.  Patient will follow up with her doctor.   Harvest Dark, MD 10/31/17 1712  Patient continues to appear very anxious.  I discussed this with the patient, she states she ran out of her Xanax yesterday, is not able to get it refilled for another week.  States she does not know how she ran out early she thinks she might of lost some pills.  I discussed with the patient she needs to follow-up with her doctor regarding this, she will call to inform them.    Harvest Dark, MD 10/31/17 [phone]

## 2017-10-31 NOTE — ED Notes (Signed)
MD called back to bedside due to patient repeatedly stating that she does not feel normal while this RN reviewing D/C instructions with Annie Main, RN, pt is also noted to be very anxious while reviewing D/C instructions. Pt c/o numbness to bilateral hands. Pt adamant she did not want MD to come to bedside, this RN called MD to bedside anyway due to patient complaints. Pt states she feels like her hands are abnormal colors, pt with cap refill < 3 seconds, strong peripheral pulses bilaterally. MD to bedside to address patient concerns. Pt reports not taking her alprazolam today. Pt noted to calm down with MD at bedside and able to speak in complete sentences, no pressured speech noted. Per MD pt is okay for D/C.

## 2017-10-31 NOTE — ED Provider Notes (Signed)
[facility] Emergency Department Provider Note   ____________________________________________   First MD Initiated Contact with Patient 10/31/17 1307     (approximate)  I have reviewed the triage vital signs and the nursing notes.   HISTORY  Chief Complaint Chest Pain    HPI Diana West is a 62 y.o. female here for evaluation of chest discomfort  Patient reports that for about 2-3 days she has been experiencing a discomfort in the left side of the chest that wraps around somewhat to her left back.  States she feels slightly short of breath, no nausea or vomiting.  No weakness or dizziness.  Describes at times the pain feels slightly sharp.  No heavy chest pressure.  No radiating pain to the jaw or arms.  No abdominal pain.  No history of any blood clots, no leg swelling.  No recent trips travel or surgery.  Currently reports her pain seems to be improved, resting comfortably in no distress.  He does have a history of palpitations and arrhythmia, she reports that this is been investigated multiple times and has led to her having episodes of passing out in the past, but she has not had any such episodes today. States she always has occasional skipped beats  No fevers, no productive cough.  Past Medical History:  Diagnosis Date  . Anxiety   . Arthritis   . Cardiac arrhythmia   . Falls frequently   . Palpitation   . Shingles    she states "they are inside and not contagious"  . TIA (transient ischemic attack)     Patient Active Problem List   Diagnosis Date Noted  . Protein-calorie malnutrition, severe 07/23/2017  . Acute cystitis 07/22/2017  . TIA (transient ischemic attack) 07/06/2017  . Hypothyroidism 06/26/2017  . Hypotension 06/26/2017  . Falls frequently 06/26/2017  . Anemia 06/26/2017  . Depression with anxiety 06/26/2017  . FH: premature coronary heart disease 06/26/2017  . Lumbar disc disease 06/26/2017  . Cervical disc disease  06/26/2017  . Chronic headaches 06/26/2017  . History of PSVT (paroxysmal supraventricular tachycardia) 06/26/2017  . Tinea corporis 06/26/2017  . Postherpetic neuralgia 06/26/2017  . CKD (chronic kidney disease) stage 3, GFR 30-59 ml/min (HCC) 06/26/2017    Past Surgical History:  Procedure Laterality Date  . ABDOMINAL HYSTERECTOMY    . CESAREAN SECTION    . CHOLECYSTECTOMY      Prior to Admission medications   Medication Sig Start Date End Date Taking? Authorizing Provider  acetaminophen (TYLENOL) 500 MG tablet Take 2 tablets (1,000 mg total) by mouth 2 (two) times daily. 06/27/17  Yes Plonk, Gwyndolyn Saxon, MD  ALPRAZolam Duanne Moron) 1 MG tablet Take 1 mg by mouth 3 (three) times daily.  04/06/17  Yes [provider]  amitriptyline (ELAVIL) 100 MG tablet Take 100 mg at bedtime by mouth.   Yes [provider]  aspirin 81 MG tablet Take 1 tablet (81 mg total) by mouth daily. 07/06/17  Yes Plonk, Gwyndolyn Saxon, MD  Cholecalciferol (VITAMIN D-3) 1000 units CAPS Take 1 capsule (1,000 Units total) by mouth daily. 06/27/17  Yes Plonk, Gwyndolyn Saxon, MD  gabapentin (NEURONTIN) 600 MG tablet Take 1 tablet (600 mg total) by mouth 3 (three) times daily. 06/26/17  Yes Plonk, Gwyndolyn Saxon, MD  hydrOXYzine (ATARAX/VISTARIL) 25 MG tablet Take 0.5 tablets (12.5 mg total) 3 (three) times daily as needed by mouth for itching. 08/25/17  Yes Marylene Land, NP  levothyroxine (SYNTHROID, LEVOTHROID) 100 MCG tablet Take 1 tablet (100 mcg total)  by mouth daily before breakfast. 09/11/17  Yes Plonk, Gwyndolyn Saxon, MD  Multiple Vitamin (MULTIVITAMIN) capsule Take 1 capsule by mouth daily.   Yes [provider]  sertraline (ZOLOFT) 100 MG tablet Take 1 tablet by mouth daily. 05/01/17  Yes [provider]    Allergies Codeine and Toradol [ketorolac tromethamine]  Family History  Problem Relation Age of Onset  . CAD Mother   . CAD Father     Social History Social History   Tobacco Use  . Smoking status:  Current Some Day Smoker    Packs/day: 0.25  . Smokeless tobacco: Never Used  Substance Use Topics  . Alcohol use: No  . Drug use: No    Review of Systems Constitutional: No fever/chills Eyes: No visual changes ENT: No sore throat Cardiovascular: See HPI Respiratory: See HPI Gastrointestinal: No abdominal pain.  No nausea, no vomiting.  No diarrhea.  No constipation Genitourinary: Negative for dysuria. Musculoskeletal: Negative for back pain. Skin: Negative for rash. Neurological: Negative for headaches, focal weakness or numbness.    ____________________________________________   PHYSICAL EXAM:  VITAL SIGNS: ED Triage Vitals [10/31/17 1301]  Enc Vitals Group     BP (!) 120/106     Pulse Rate 96     Resp 20     Temp 97.8 F (36.6 C)     Temp Source Oral     SpO2 99 %     Weight 115 lb (52.2 kg)     Height 5\' 7"  (1.702 m)     Head Circumference      Peak Flow      Pain Score 6     Pain Loc      Pain Edu?      Excl. in [place]?     Constitutional: Alert and oriented. Well appearing and in no acute distress.  Very pleasant, seems slightly anxious reports she has not had her Xanax yet today.  Normally she would take her Xanax now. Eyes: Conjunctivae are normal. Head: Atraumatic. Nose: No congestion/rhinnorhea. Mouth/Throat: Mucous membranes are moist. Neck: No stridor.   Cardiovascular: Normal rate, regular rhythm. Grossly normal heart sounds.  Good peripheral circulation. Respiratory: Normal respiratory effort.  No retractions. Lungs CTAB.  Clear lungs.  Speaks in full clear sentences.  No respiratory distress. Gastrointestinal: Soft and nontender. No distention.  No left upper quadrant tenderness. Musculoskeletal: No lower extremity tenderness nor edema. Neurologic:  Normal speech and language. No gross focal neurologic deficits are appreciated.  Skin:  Skin is warm, dry and intact. No rash noted. Psychiatric: Mood and affect are normal. Speech and behavior are  normal.  ____________________________________________   LABS (all labs ordered are listed, but only abnormal results are displayed)  Labs Reviewed  BASIC METABOLIC PANEL - Abnormal; Notable for the following components:      Result Value   Glucose, Bld 106 (*)    Creatinine, Ser 1.07 (*)    GFR calc non Af Amer 54 (*)    All other components within normal limits  CBC - Abnormal; Notable for the following components:   Hemoglobin 11.4 (*)    HCT 34.2 (*)    RDW 17.8 (*)    All other components within normal limits  FIBRIN DERIVATIVES D-DIMER ([facility] ONLY) - Abnormal; Notable for the following components:   Fibrin derivatives D-dimer ([facility]) 728.69 (*)    All other components within normal limits  TROPONIN I  TROPONIN I   ____________________________________________  EKG  EKG reviewed enterotomy at 1257  Heart rate 110 QRS 110 QTC 470 Sinus tachycardia, baseline wander, multiple PVCs Sinus rhythm, no evidence of ischemia noted. ____________________________________________  RADIOLOGY    Chest x-ray reviewed by me, no acute disease ____________________________________________   PROCEDURES  Procedure(s) performed: None  Procedures  Critical Care performed: No  ____________________________________________   INITIAL IMPRESSION / ASSESSMENT AND PLAN / ED COURSE  Pertinent labs & imaging results that were available during my care of the patient were reviewed by me and considered in my medical decision making (see chart for details).  Patient presents for evaluation of dyspnea.  Normal oxygen saturation, did present initially with some tachycardia which is resolved in the room.  Reports a very atypical left sided chest discomfort that is sharp and intermittent but seems to have been relieved now.  Not associated any infectious symptoms.  Does have a history of arrhythmia, multiple PVCs are noted, and has documentation charts of the patient having history of PVCs.  Does  not appear to be acute.  No typical symptoms of chest pain.  Patient felt to be low risk for PE, dissection.  D-dimer is elevated, given the patient's left side discomfort atypical but some what sharp in nature will obtain a CT angios to rule out PE.  Thereafter her troponin is normal, no evidence of ischemia on EKG, atypical symptoms feel very low risk for coronary disease.  Given the patient's age plan to repeat a second troponin, this is normal and her CT angiogram is negative for acute then I would anticipate discharge home with careful chest pain return precautions.  ----------------------------------------- 3:19 PM on 10/31/2017 -----------------------------------------  Patient is resting comfortably, awaiting CT angiography to exclude PE.  Repeat troponin plan, if negative plan to discharge the patient home with outpatient recommendation for cardiology follow-up.  Patient agreeable with plan.  Ongoing care assigned to Dr. Kerman Passey, follow-up CT angio chest and repeat troponin.      ____________________________________________   FINAL CLINICAL IMPRESSION(S) / ED DIAGNOSES  Final diagnoses:  Intermittent left-sided chest pain  Pleuritic chest pain  Atypical chest pain      NEW MEDICATIONS STARTED DURING THIS VISIT:  New Prescriptions   No medications on file     Note:  This document was prepared using Dragon voice recognition software and may include unintentional dictation errors.     Delman Kitten, MD 10/31/17 [phone]

## 2017-10-31 NOTE — Discharge Instructions (Addendum)
You have been seen in the Emergency Department (ED) today for chest pain.  As we have discussed today?s test results are normal, but you may require further testing.  Please follow up with the recommended doctor as instructed above in these documents regarding today?s emergent visit and your recent symptoms to discuss further management.  Continue to take your regular medications. If you are not doing so already, please also take a daily baby aspirin (81 mg), at least until you follow up with your doctor.  Return to the Emergency Department (ED) if you experience any further chest pain/pressure/tightness, difficulty breathing, or sudden sweating, or other symptoms that concern you.   As we discussed please follow-up with your doctor as soon as possible to discuss the CT findings of a cyst on your pancreas as well as a small lung nodule.  The CT report has been pasted below.  You will need pancreatic imaging as soon as possible to further evaluate.    IMPRESSION:  1.  No demonstrable pulmonary embolus.     2. 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 Advise nonemergent pre and post contrast MR or CT of the  pancreas for further assessment of this lesion. This lesion will  warrant additional surveillance. A cystic pancreatic neoplasm cannot  be excluded on this current examination.     3.  Bibasilar atelectasis.  No edema or consolidation.     4. 3 mm nodular opacity right middle lobe. No follow-up needed if  patient is low-risk. Non-contrast chest CT can be considered in 12  months if patient is high-risk. This recommendation follows the  consensus statement: Guidelines for Management of Incidental  Pulmonary Nodules Detected on CT Images: From the Fleischner Society  2017; Radiology 2017; 284:228-243.

## 2017-10-31 NOTE — ED Triage Notes (Signed)
Pt comes into the ED via [facility] from home c/o chest pain tat wraps around on the left side around to her back.  Patient states she feels short of breath as well but denies any N/V or dizziness.  Patient did not take her xanax this morning and does present very anxious at this time.  Patient is cooperative at this time.  Patient has even and unlabored respirations and alert and oriented x4.  All VSS with EMS

## 2017-11-05 ENCOUNTER — Telehealth: Payer: Self-pay
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
## 2017-11-06 ENCOUNTER — Telehealth: Payer: Self-pay

## 2017-11-06 NOTE — Telephone Encounter (Signed)
Patient LM asking if we sent records or referral for her appointment Friday with [facility]. I fefel if ER made this appointment notes should be there. Dr.Jones said I should wait for you to see if we need to send anything.

## 2017-11-07 NOTE — Telephone Encounter (Signed)
Looks like ER made cardiology referral. Shouldn't need anything from [place].

## 2017-11-11 ENCOUNTER — Telehealth: Payer: Self-pay | Admitting: Family Medicine

## 2017-11-11 NOTE — Telephone Encounter (Signed)
Ms Arps called and said that imaging told her to follow up with her primary to do more tests from the images from her CT. She also left a voicemail.

## 2017-11-12 ENCOUNTER — Other Ambulatory Visit: Payer: Self-pay | Admitting: Family Medicine

## 2017-11-12 DIAGNOSIS — K862 Cyst of pancreas: Secondary | ICD-10-CM | POA: Insufficient documentation

## 2017-11-13 ENCOUNTER — Telehealth: Payer: Self-pay

## 2017-11-13 NOTE — Telephone Encounter (Signed)
Called to give info on U/S date and time. 11/18/2017 arrive 8:45 am for a 9:00 am. NPO after midnight. Addvised to call me back and let me know she got this message. Also mailed appt info.

## 2017-11-18 ENCOUNTER — Ambulatory Visit: Payer: Medicare (Managed Care)

## 2017-12-26 ENCOUNTER — Telehealth: Payer: Self-pay

## 2017-12-26 ENCOUNTER — Encounter: Payer: Self-pay | Admitting: *Deleted

## 2017-12-26 ENCOUNTER — Ambulatory Visit
Admission: EM | Admit: 2017-12-26 | Discharge: 2017-12-26 | Disposition: A | Discharge status: Nursing Home (Includes ICF) | Payer: Medicare (Managed Care) | Attending: Family Medicine | Admitting: Family Medicine

## 2017-12-26 ENCOUNTER — Other Ambulatory Visit: Payer: Self-pay

## 2017-12-26 DIAGNOSIS — S0083XA Contusion of other part of head, initial encounter: Secondary | ICD-10-CM

## 2017-12-26 DIAGNOSIS — S0990XA Unspecified injury of head, initial encounter: Secondary | ICD-10-CM

## 2017-12-26 DIAGNOSIS — S060X9A Concussion with loss of consciousness of unspecified duration, initial encounter: Secondary | ICD-10-CM

## 2017-12-26 DIAGNOSIS — W19XXXA Unspecified fall, initial encounter: Secondary | ICD-10-CM | POA: Diagnosis not present

## 2017-12-26 MED ORDER — ACETAMINOPHEN 500 MG PO TABS
1000.0000 mg | ORAL_TABLET | Freq: Once | ORAL | Status: AC
  Administered 2017-12-26: 1000 mg via ORAL

## 2017-12-26 NOTE — Telephone Encounter (Signed)
Patient called in distress after a fall last night. She said her face is bad. Her eye is bleeding and her head has cuts and she has horrible headache. She cried she is in severe pain. Did not pass out. No one is there and no one saw fall. I advised her to call  911 and go to ER. She wants to wait for ride to go to [facility] and I explained sooner than later is best. She will try to get ride and if no luck in few minutes she will go by EMS to ER.

## 2017-12-26 NOTE — ED Triage Notes (Signed)
Patient fell last night when she tripped going to the mailbox. Patient has injured her face, arms and chest during the fall.  PAtient has a history of falls.

## 2017-12-26 NOTE — Discharge Instructions (Addendum)
Recommend patient go to Emergency Department for further evaluation and management °

## 2017-12-26 NOTE — ED Provider Notes (Signed)
[facility]    CSN: [id number] Arrival date & time: 12/26/17  1416     History   Chief Complaint Chief Complaint  Patient presents with  . Fall    HPI Diana West is a 62 y.o. female.   62 yo female with a c/o facial pain, headache and intermittent blurred vision since last night after falling outside while going to her mailbox and hitting her head and face. States she thinks she may have blacked out "only for a second".   Patient takes aspirin daily.   The history is provided by the patient.  Fall     Past Medical History:  Diagnosis Date  . Anxiety   . Arthritis   . Cardiac arrhythmia   . Falls frequently   . Palpitation   . Shingles    she states "they are inside and not contagious"  . TIA (transient ischemic attack)     Patient Active Problem List   Diagnosis Date Noted  . Pancreatic cyst 11/12/2017  . Protein-calorie malnutrition, severe 07/23/2017  . Acute cystitis 07/22/2017  . TIA (transient ischemic attack) 07/06/2017  . Hypothyroidism 06/26/2017  . Hypotension 06/26/2017  . Falls frequently 06/26/2017  . Anemia 06/26/2017  . Depression with anxiety 06/26/2017  . FH: premature coronary heart disease 06/26/2017  . Lumbar disc disease 06/26/2017  . Cervical disc disease 06/26/2017  . Chronic headaches 06/26/2017  . History of PSVT (paroxysmal supraventricular tachycardia) 06/26/2017  . Tinea corporis 06/26/2017  . Postherpetic neuralgia 06/26/2017  . CKD (chronic kidney disease) stage 3, GFR 30-59 ml/min (HCC) 06/26/2017    Past Surgical History:  Procedure Laterality Date  . ABDOMINAL HYSTERECTOMY    . CESAREAN SECTION    . CHOLECYSTECTOMY      OB History   None      Home Medications    Prior to Admission medications   Medication Sig Start Date End Date Taking? Authorizing Provider  ALPRAZolam Duanne Moron) 1 MG tablet Take 1 mg by mouth 3 (three) times daily.  04/06/17  Yes [provider]  amitriptyline (ELAVIL)  100 MG tablet Take 100 mg at bedtime by mouth.   Yes [provider]  aspirin 81 MG tablet Take 1 tablet (81 mg total) by mouth daily. 07/06/17  Yes Plonk, Gwyndolyn Saxon, MD  Cholecalciferol (VITAMIN D-3) 1000 units CAPS Take 1 capsule (1,000 Units total) by mouth daily. 06/27/17  Yes Plonk, Gwyndolyn Saxon, MD  gabapentin (NEURONTIN) 600 MG tablet Take 1 tablet (600 mg total) by mouth 3 (three) times daily. 06/26/17  Yes Plonk, Gwyndolyn Saxon, MD  levothyroxine (SYNTHROID, LEVOTHROID) 100 MCG tablet Take 1 tablet (100 mcg total) by mouth daily before breakfast. 09/11/17  Yes Plonk, Gwyndolyn Saxon, MD  Multiple Vitamin (MULTIVITAMIN) capsule Take 1 capsule by mouth daily.   Yes [provider]  sertraline (ZOLOFT) 100 MG tablet Take 1 tablet by mouth daily. 05/01/17  Yes [provider]  acetaminophen (TYLENOL) 500 MG tablet Take 2 tablets (1,000 mg total) by mouth 2 (two) times daily. 06/27/17   Plonk, Gwyndolyn Saxon, MD  hydrOXYzine (ATARAX/VISTARIL) 25 MG tablet Take 0.5 tablets (12.5 mg total) 3 (three) times daily as needed by mouth for itching. 08/25/17   Marylene Land, NP    Family History Family History  Problem Relation Age of Onset  . CAD Mother   . CAD Father     Social History Social History   Tobacco Use  . Smoking status: Current Some Day Smoker    Packs/day: 0.25  Types: Cigarettes  . Smokeless tobacco: Never Used  Substance Use Topics  . Alcohol use: No  . Drug use: No     Allergies   Codeine and Toradol [ketorolac tromethamine]   Review of Systems Review of Systems   Physical Exam Triage Vital Signs ED Triage Vitals  Enc Vitals Group     BP 12/26/17 1456 (!) 91/56     Pulse Rate 12/26/17 1456 89     Resp 12/26/17 1456 18     Temp --      Temp src --      SpO2 12/26/17 1456 100 %     Weight 12/26/17 1458 125 lb (56.7 kg)     Height 12/26/17 1458 5\' 7"  (1.702 m)     Head Circumference --      Peak Flow --      Pain Score 12/26/17 1457 8     Pain Loc --       Pain Edu? --      Excl. in [place]? --    No data found.  Updated Vital Signs BP (!) 91/56 (BP Location: Left Arm)   Pulse 89   Resp 18   Ht 5\' 7"  (1.702 m)   Wt 125 lb (56.7 kg)   SpO2 100%   BMI 19.58 kg/m   Visual Acuity Right Eye Distance:   Left Eye Distance:   Bilateral Distance:    Right Eye Near:   Left Eye Near:    Bilateral Near:     Physical Exam  Constitutional: She is oriented to person, place, and time. She appears well-developed and well-nourished.  Non-toxic appearance. No distress.  HENT:  Head: Normocephalic. Head is with raccoon's eyes.  Right Ear: Tympanic membrane, external ear and ear canal normal.  Left Ear: Tympanic membrane, external ear and ear canal normal.  Nose: No mucosal edema, rhinorrhea, nose lacerations, sinus tenderness, nasal deformity, septal deviation or nasal septal hematoma. No epistaxis.  No foreign bodies. Right sinus exhibits no maxillary sinus tenderness and no frontal sinus tenderness. Left sinus exhibits no maxillary sinus tenderness and no frontal sinus tenderness.  Mouth/Throat: Uvula is midline, oropharynx is clear and moist and mucous membranes are normal. No oropharyngeal exudate. No tonsillar exudate.  Bruising noted around the eye; tenderness to palpation over the left zygomatic arch  Eyes: Pupils are equal, round, and reactive to light. Conjunctivae and EOM are normal. Right eye exhibits no discharge. Left eye exhibits no discharge. No scleral icterus.  Neck: Normal range of motion. Neck supple. No thyromegaly present.  Cardiovascular: Normal rate, regular rhythm and normal heart sounds.  Pulmonary/Chest: Effort normal and breath sounds normal. No stridor. No respiratory distress. She has no wheezes. She has no rales.  Lymphadenopathy:    She has no cervical adenopathy.  Neurological: She is alert and oriented to person, place, and time. She displays normal reflexes. No cranial nerve deficit or sensory deficit. She exhibits  normal muscle tone. Coordination normal.  Skin: She is not diaphoretic.  Nursing note and vitals reviewed.    UC Treatments / Results  Labs (all labs ordered are listed, but only abnormal results are displayed) Labs Reviewed - No data to display  EKG None Radiology No results found.  Procedures Procedures (including critical care time)  Medications Ordered in UC Medications  acetaminophen (TYLENOL) tablet 1,000 mg (1,000 mg Oral Given 12/26/17 1538)     Initial Impression / Assessment and Plan / UC Course  I have reviewed the triage  vital signs and the nursing notes.  Pertinent labs & imaging results that were available during my care of the patient were reviewed by me and considered in my medical decision making (see chart for details).         Final Clinical Impressions(s) / UC Diagnoses   Final diagnoses:  Injury of head, initial encounter  Contusion of face, initial encounter  Concussion with loss of consciousness, initial encounter    ED Discharge Orders    None     1. Possible diagnosis reviewed with patient; recommend patient go to Emergency Department for further evaluation and management.    Controlled Substance Prescriptions [place] Controlled Substance Registry consulted? Not Applicable   Norval Gable, MD 12/26/17 [phone]

## 2017-12-26 NOTE — Telephone Encounter (Signed)
Meds reviewed, on Elavil which increases fall risk, recommend d/c after ED evaluation.

## 2017-12-29 NOTE — Telephone Encounter (Signed)
Left on VM

## 2018-02-12 ENCOUNTER — Encounter: Payer: Self-pay | Admitting: Emergency Medicine

## 2018-02-12 ENCOUNTER — Emergency Department: Payer: Medicare Other

## 2018-02-12 ENCOUNTER — Inpatient Hospital Stay
Admission: EM | Admit: 2018-02-12 | Discharge: 2018-02-14 | DRG: 682 | Disposition: A | Discharge status: Home Health | Payer: Medicare Other | Attending: Family Medicine | Admitting: Family Medicine

## 2018-02-12 DIAGNOSIS — N390 Urinary tract infection, site not specified: Secondary | ICD-10-CM | POA: Diagnosis not present

## 2018-02-12 DIAGNOSIS — R4182 Altered mental status, unspecified: Secondary | ICD-10-CM

## 2018-02-12 DIAGNOSIS — F17210 Nicotine dependence, cigarettes, uncomplicated: Secondary | ICD-10-CM | POA: Diagnosis present

## 2018-02-12 DIAGNOSIS — Y92009 Unspecified place in unspecified non-institutional (private) residence as the place of occurrence of the external cause: Secondary | ICD-10-CM

## 2018-02-12 DIAGNOSIS — M25561 Pain in right knee: Secondary | ICD-10-CM

## 2018-02-12 DIAGNOSIS — M7138 Other bursal cyst, other site: Secondary | ICD-10-CM | POA: Diagnosis present

## 2018-02-12 DIAGNOSIS — Z8249 Family history of ischemic heart disease and other diseases of the circulatory system: Secondary | ICD-10-CM

## 2018-02-12 DIAGNOSIS — F419 Anxiety disorder, unspecified: Secondary | ICD-10-CM | POA: Diagnosis present

## 2018-02-12 DIAGNOSIS — Z8673 Personal history of transient ischemic attack (TIA), and cerebral infarction without residual deficits: Secondary | ICD-10-CM

## 2018-02-12 DIAGNOSIS — Z885 Allergy status to narcotic agent status: Secondary | ICD-10-CM

## 2018-02-12 DIAGNOSIS — Z7982 Long term (current) use of aspirin: Secondary | ICD-10-CM

## 2018-02-12 DIAGNOSIS — M25461 Effusion, right knee: Secondary | ICD-10-CM

## 2018-02-12 DIAGNOSIS — N179 Acute kidney failure, unspecified: Secondary | ICD-10-CM | POA: Diagnosis not present

## 2018-02-12 DIAGNOSIS — G92 Toxic encephalopathy: Secondary | ICD-10-CM | POA: Diagnosis present

## 2018-02-12 DIAGNOSIS — M67461 Ganglion, right knee: Secondary | ICD-10-CM | POA: Diagnosis present

## 2018-02-12 DIAGNOSIS — T424X5A Adverse effect of benzodiazepines, initial encounter: Secondary | ICD-10-CM | POA: Diagnosis present

## 2018-02-12 DIAGNOSIS — T426X5A Adverse effect of other antiepileptic and sedative-hypnotic drugs, initial encounter: Secondary | ICD-10-CM | POA: Diagnosis present

## 2018-02-12 DIAGNOSIS — G9341 Metabolic encephalopathy: Secondary | ICD-10-CM | POA: Diagnosis present

## 2018-02-12 LAB — COMPREHENSIVE METABOLIC PANEL
ALK PHOS: 81 U/L (ref 38–126)
ALT: 18 U/L (ref 14–54)
AST: 36 U/L (ref 15–41)
Albumin: 4 g/dL (ref 3.5–5.0)
Anion gap: 10 (ref 5–15)
BUN: 35 mg/dL — AB (ref 6–20)
CALCIUM: 9.1 mg/dL (ref 8.9–10.3)
CO2: 22 mmol/L (ref 22–32)
CREATININE: 1.11 mg/dL — AB (ref 0.44–1.00)
Chloride: 104 mmol/L (ref 101–111)
GFR calc non Af Amer: 52 mL/min — ABNORMAL LOW (ref >60)
GLUCOSE: 87 mg/dL (ref 65–99)
Potassium: 3.5 mmol/L (ref 3.5–5.1)
SODIUM: 136 mmol/L (ref 135–145)
Total Bilirubin: 0.5 mg/dL (ref 0.3–1.2)
Total Protein: 8.4 g/dL — ABNORMAL HIGH (ref 6.5–8.1)

## 2018-02-12 LAB — CBC WITH DIFFERENTIAL/PLATELET
BASOS PCT: 1 %
Basophils Absolute: 0.1 10*3/uL (ref 0–0.1)
EOS ABS: 0.2 10*3/uL (ref 0–0.7)
EOS PCT: 2 %
HCT: 29.9 % — ABNORMAL LOW (ref 35.0–47.0)
Hemoglobin: 9.8 g/dL — ABNORMAL LOW (ref 12.0–16.0)
LYMPHS ABS: 1.8 10*3/uL (ref 1.0–3.6)
Lymphocytes Relative: 23 %
MCH: 26.8 pg (ref 26.0–34.0)
MCHC: 32.8 g/dL (ref 32.0–36.0)
MCV: 81.8 fL (ref 80.0–100.0)
MONO ABS: 0.5 10*3/uL (ref 0.2–0.9)
MONOS PCT: 7 %
NEUTROS PCT: 67 %
Neutro Abs: 5.3 10*3/uL (ref 1.4–6.5)
PLATELETS: 315 10*3/uL (ref 150–440)
RBC: 3.66 MIL/uL — ABNORMAL LOW (ref 3.80–5.20)
RDW: 18.8 % — AB (ref 11.5–14.5)
WBC: 7.9 10*3/uL (ref 3.6–11.0)

## 2018-02-12 LAB — ETHANOL

## 2018-02-12 LAB — TROPONIN I

## 2018-02-12 LAB — SALICYLATE LEVEL: Salicylate Lvl: 10.6 mg/dL (ref 2.8–30.0)

## 2018-02-12 LAB — ACETAMINOPHEN LEVEL: Acetaminophen (Tylenol), Serum: <10 ug/mL — ABNORMAL LOW (ref 10–30)

## 2018-02-12 MED ORDER — LORAZEPAM 2 MG/ML IJ SOLN
INTRAMUSCULAR | Status: AC
  Administered 2018-02-12: 2 mg via INTRAVENOUS
  Filled 2018-02-12: qty 1

## 2018-02-12 MED ORDER — HALOPERIDOL LACTATE 5 MG/ML IJ SOLN
2.5000 mg | Freq: Once | INTRAMUSCULAR | Status: AC
  Administered 2018-02-12: 2.5 mg via INTRAVENOUS
  Filled 2018-02-12: qty 1

## 2018-02-12 MED ORDER — MANNITOL 25 % IV SOLN: 25.0000 g | Freq: Once | INTRAVENOUS | Status: DC

## 2018-02-12 MED ORDER — LORAZEPAM 2 MG/ML IJ SOLN
2.0000 mg | Freq: Once | INTRAMUSCULAR | Status: AC
  Administered 2018-02-12: 2 mg via INTRAVENOUS

## 2018-02-12 NOTE — ED Notes (Signed)
Spoke with son of pt. Pt has a hx of psychosis and paranoia. Pt has been hospitalized r/t same. Pt has had episodes where she has taken too much of her medication and had odd behavior. Pt lives alone and has control over her meds. Son did have control and pt did not like it. Pt son is Laurena Slimmer # [phone]
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
## 2018-02-12 NOTE — ED Notes (Signed)
Gabriel Rung, son of patient called for an update.  His phone number is [phone].

## 2018-02-12 NOTE — ED Notes (Signed)
Patient is resting comfortably. 

## 2018-02-12 NOTE — ED Notes (Signed)
IVC by Md

## 2018-02-12 NOTE — ED Notes (Signed)
This RN and English as a second language teacher tried to reason with the patient at to why we need blood and why we need an EKG , but pt has refused to allow [place] to touch her or care for her. She is demanding to be let off the stretcher and sent home.

## 2018-02-12 NOTE — ED Triage Notes (Signed)
Pt to ED by EMS from home. Pt was found wandering outside her apartment complex. Pt unable to give her birth date or ss# for registration. Pt states she is "not sure where she is suppose to be".  Pt states she has generalized pain but that it is normal.

## 2018-02-12 NOTE — ED Notes (Signed)
Unable to obtain psych assessment at this time.

## 2018-02-12 NOTE — BH Assessment (Signed)
Pt asleep, difficult to arouse. TTS unable to assess at this time.

## 2018-02-12 NOTE — ED Notes (Signed)
Pt is sleeping soundly. Attempt to wake pt unsuccessful. Pt was combative earlier and then sedated by MD. Will allow pt to sleep and then attempt to obtain a urine specimen. No s/sx of distress.

## 2018-02-12 NOTE — ED Provider Notes (Signed)
[facility] Emergency Department Provider Note ____________________________________________   First MD Initiated Contact with Patient 02/12/18 1506     (approximate)  I have reviewed the triage vital signs and the nursing notes.   HISTORY  Chief Complaint Altered Mental Status  Level 5 caveat: HPI limited due to possible altered mental status   HPI Diana West is a 62 y.o. female with PMH as noted below who presents with concern for possible altered mental status.  Per EMS, the patient was found wandering outside her apartment complex.  She apparently appeared confused.  The patient herself states that she has been feeling somewhat confused and weak.  Her only other complaint is chronic pain to all of her joints for which she takes OxyContin as well as anxiety for which she takes Xanax.  Past Medical History:  Diagnosis Date  . Anxiety   . Arthritis   . Cardiac arrhythmia   . Falls frequently   . Palpitation   . Shingles    she states "they are inside and not contagious"  . TIA (transient ischemic attack)     Patient Active Problem List   Diagnosis Date Noted  . Pancreatic cyst 11/12/2017  . Protein-calorie malnutrition, severe 07/23/2017  . Acute cystitis 07/22/2017  . TIA (transient ischemic attack) 07/06/2017  . Hypothyroidism 06/26/2017  . Hypotension 06/26/2017  . Falls frequently 06/26/2017  . Anemia 06/26/2017  . Depression with anxiety 06/26/2017  . FH: premature coronary heart disease 06/26/2017  . Lumbar disc disease 06/26/2017  . Cervical disc disease 06/26/2017  . Chronic headaches 06/26/2017  . History of PSVT (paroxysmal supraventricular tachycardia) 06/26/2017  . Tinea corporis 06/26/2017  . Postherpetic neuralgia 06/26/2017  . CKD (chronic kidney disease) stage 3, GFR 30-59 ml/min (HCC) 06/26/2017    Past Surgical History:  Procedure Laterality Date  . ABDOMINAL HYSTERECTOMY    . CESAREAN SECTION    . CHOLECYSTECTOMY       Prior to Admission medications   Medication Sig Start Date End Date Taking? Authorizing Provider  acetaminophen (TYLENOL) 500 MG tablet Take 2 tablets (1,000 mg total) by mouth 2 (two) times daily. 06/27/17  Yes Plonk, Gwyndolyn Saxon, MD  ALPRAZolam Duanne Moron) 1 MG tablet Take 1 mg by mouth 3 (three) times daily.  04/06/17  Yes [provider]  amitriptyline (ELAVIL) 150 MG tablet Take 150 mg by mouth at bedtime.    Yes [provider]  aspirin 81 MG tablet Take 1 tablet (81 mg total) by mouth daily. 07/06/17  Yes Plonk, Gwyndolyn Saxon, MD  Cholecalciferol (VITAMIN D-3) 1000 units CAPS Take 1 capsule (1,000 Units total) by mouth daily. 06/27/17  Yes Plonk, Gwyndolyn Saxon, MD  gabapentin (NEURONTIN) 600 MG tablet Take 1 tablet (600 mg total) by mouth 3 (three) times daily. 06/26/17  Yes Plonk, Gwyndolyn Saxon, MD  levothyroxine (SYNTHROID, LEVOTHROID) 100 MCG tablet Take 1 tablet (100 mcg total) by mouth daily before breakfast. Patient taking differently: Take 125 mcg by mouth daily before breakfast.  09/11/17  Yes Plonk, Gwyndolyn Saxon, MD  Multiple Vitamin (MULTIVITAMIN) capsule Take 1 capsule by mouth daily.   Yes [provider]  sertraline (ZOLOFT) 100 MG tablet Take 1 tablet by mouth daily. 05/01/17  Yes [provider]  hydrOXYzine (ATARAX/VISTARIL) 25 MG tablet Take 0.5 tablets (12.5 mg total) 3 (three) times daily as needed by mouth for itching. Patient not taking: Reported on 02/12/2018 08/25/17   Marylene Land, NP    Allergies Codeine and Toradol [ketorolac tromethamine]  Family History  Problem Relation Age of Onset  . CAD Mother   . CAD Father     Social History Social History   Tobacco Use  . Smoking status: Current Some Day Smoker    Packs/day: 0.25    Types: Cigarettes  . Smokeless tobacco: Never Used  Substance Use Topics  . Alcohol use: No  . Drug use: No    Review of Systems Level 5 caveat: Unable to obtain complete review of systems due to possible altered  mental status Constitutional: Denies fever. Eyes: No redness. Cardiovascular: Denies chest pain. Respiratory: Denies shortness of breath. Gastrointestinal: Denies vomiting.  Genitourinary: Negative for dysuria.  Musculoskeletal: As of her generalized joint pain. Skin: Negative for rash. Neurological: Negative for headache.  ____________________________________________   PHYSICAL EXAM:  VITAL SIGNS: ED Triage Vitals  Enc Vitals Group     BP 02/12/18 1504 123/71     Pulse Rate 02/12/18 1504 93     Resp 02/12/18 1504 16     Temp 02/12/18 1504 98.3 F (36.8 C)     Temp Source 02/12/18 1504 Oral     SpO2 02/12/18 1504 98 %     Weight --      Height --      Head Circumference --      Peak Flow --      Pain Score 02/12/18 1456 0     Pain Loc --      Pain Edu? --      Excl. in [place]? --     Constitutional: Alert, oriented x4.  Relatively comfortable appearing. Eyes: Conjunctivae are normal.  EOMI.  PERRLA. Head: Atraumatic. Nose: No congestion/rhinnorhea. Mouth/Throat: Mucous membranes are moist.   Neck: Normal range of motion.  Cardiovascular: Normal rate, regular rhythm. Grossly normal heart sounds.  Good peripheral circulation. Respiratory: Normal respiratory effort.  No retractions. Lungs CTAB. Gastrointestinal: Soft and nontender. No distention.  Genitourinary: No flank tenderness. Musculoskeletal: No lower extremity edema.  Extremities warm and well perfused.  Neurologic:  Normal speech and language.  Motor intact in all extremities.  Normal coordination.  No gross focal neurologic deficits are appreciated.  Skin:  Skin is warm and dry. No rash noted. Psychiatric: Somewhat anxious appearing, with tangential thoughts.  ____________________________________________   LABS (all labs ordered are listed, but only abnormal results are displayed)  Labs Reviewed  COMPREHENSIVE METABOLIC PANEL - Abnormal; Notable for the following components:      Result Value   BUN 35 (*)     Creatinine, Ser 1.11 (*)    Total Protein 8.4 (*)    GFR calc non Af Amer 52 (*)    All other components within normal limits  CBC WITH DIFFERENTIAL/PLATELET - Abnormal; Notable for the following components:   RBC 3.66 (*)    Hemoglobin 9.8 (*)    HCT 29.9 (*)    RDW 18.8 (*)    All other components within normal limits  ACETAMINOPHEN LEVEL - Abnormal; Notable for the following components:   Acetaminophen (Tylenol), Serum <10 (*)    All other components within normal limits  TROPONIN I  ETHANOL  SALICYLATE LEVEL  URINALYSIS, COMPLETE (UACMP) WITH MICROSCOPIC  URINE DRUG SCREEN, QUALITATIVE ([facility] ONLY)   ____________________________________________  EKG  ED ECG REPORT I, Arta Silence, the attending physician, personally viewed and interpreted this ECG.  Date: 02/12/2018 EKG Time: 1646 Rate: 92 Rhythm: normal sinus rhythm QRS Axis: normal Intervals: normal ST/T Wave abnormalities: normal Narrative Interpretation: no evidence of acute ischemia  ____________________________________________  RADIOLOGY  CT head: No ICH or other acute findings CXR: No focal infiltrate or other acute findings  ____________________________________________   PROCEDURES  Procedure(s) performed: No  Procedures  Critical Care performed: No ____________________________________________   INITIAL IMPRESSION / ASSESSMENT AND PLAN / ED COURSE  Pertinent labs & imaging results that were available during my care of the patient were reviewed by me and considered in my medical decision making (see chart for details).  62 year old female with PMH as noted above presents after she was found wandering around her apartment complex with apparent confusion.  Patient states that she has been feeling somewhat weak and confused, but her main complaint is generalized joint pain that she states she takes OxyContin for.  She also reports anxiety.  I reviewed the past medical records in Epic;  patient was admitted last year for encephalopathy in the context of UTI.  She has had other ED visits since that time for other complaints.  No apparent prior mental health admissions.  Overall given patient's age and the mild confusion and vague symptoms, patient's presentation is most consistent with an organic cause such as UTI or other infection, dehydration or other metabolic abnormality, or medication side effect.  Also consider CNS cause.  Plan on CT head, chest x-ray and UA, lab work-up, and reassess.  Patient expressed agreement with this plan.    ----------------------------------------- 5:32 PM on 02/12/2018 -----------------------------------------  Although initially when I evaluated the patient she stated that she felt unwell and agreed with [place] performing some test to try to help figure out what was going on with her, she then became increasingly agitated, and got up to try to leave the room.  The patient began yelling things that did not make sense, such as yelling to 1 of the ED techs "you know where I live" and "it's HER!" to a nurse.   We were unable to verbally redirect the patient.  At this time it is unclear if the patient is altered due to an organic cause, medication related side effects, or underlying psychiatric issue, however given that she is acutely confused, attempting to leave, and not able to sustain a conversation or demonstrate any decision-making capacity, she is in acute danger to self and possibly others, and so I proceeded with IVC paperwork.  We have given the patient Ativan to help calm her, and she is now sleeping comfortably.  We will proceed with the remainder of the medical work-up.  If no acute findings, then plan for psych evaluation.   ----------------------------------------- 11:41 PM on 02/12/2018 -----------------------------------------  Patient's lab work-up shows no concerning acute findings.  CT head and chest x-ray negative.  She is still  pending urinalysis.  I had a discussion with the patient's son over the phone.  He related that the patient has had increasingly erratic behavior and he is concerned that this could be something psychiatric or related to her medications.  He stated that she apparently had similar but less severe episode previously which at that time was thought to be related to pain and anxiety medication, although he states that she is no longer on any pain medication chronically.  Given her mental status after medication, the patient likely will not be able to comply with [facility] evaluation.  The plan will be for psych evaluation in the morning and then disposition per psych recommendations.  I signed the patient out to the oncoming physician Dr. Cinda Quest.  ____________________________________________   FINAL CLINICAL IMPRESSION(S) / ED DIAGNOSES  Final  diagnoses:  Altered mental status, unspecified altered mental status type      NEW MEDICATIONS STARTED DURING THIS VISIT:  New Prescriptions   No medications on file     Note:  This document was prepared using Dragon voice recognition software and may include unintentional dictation errors.    Arta Silence, MD 02/12/18 [phone]

## 2018-02-13 DIAGNOSIS — G92 Toxic encephalopathy: Secondary | ICD-10-CM | POA: Diagnosis present

## 2018-02-13 DIAGNOSIS — F419 Anxiety disorder, unspecified: Secondary | ICD-10-CM | POA: Diagnosis present

## 2018-02-13 DIAGNOSIS — F17210 Nicotine dependence, cigarettes, uncomplicated: Secondary | ICD-10-CM | POA: Diagnosis present

## 2018-02-13 DIAGNOSIS — M7138 Other bursal cyst, other site: Secondary | ICD-10-CM | POA: Diagnosis present

## 2018-02-13 DIAGNOSIS — M67461 Ganglion, right knee: Secondary | ICD-10-CM | POA: Diagnosis present

## 2018-02-13 DIAGNOSIS — Z7982 Long term (current) use of aspirin: Secondary | ICD-10-CM | POA: Diagnosis not present

## 2018-02-13 DIAGNOSIS — N179 Acute kidney failure, unspecified: Secondary | ICD-10-CM | POA: Diagnosis present

## 2018-02-13 DIAGNOSIS — N390 Urinary tract infection, site not specified: Secondary | ICD-10-CM | POA: Diagnosis present

## 2018-02-13 DIAGNOSIS — Z8673 Personal history of transient ischemic attack (TIA), and cerebral infarction without residual deficits: Secondary | ICD-10-CM | POA: Diagnosis not present

## 2018-02-13 DIAGNOSIS — Z8249 Family history of ischemic heart disease and other diseases of the circulatory system: Secondary | ICD-10-CM | POA: Diagnosis not present

## 2018-02-13 DIAGNOSIS — G9341 Metabolic encephalopathy: Secondary | ICD-10-CM | POA: Diagnosis present

## 2018-02-13 DIAGNOSIS — Z885 Allergy status to narcotic agent status: Secondary | ICD-10-CM | POA: Diagnosis not present

## 2018-02-13 DIAGNOSIS — Y92009 Unspecified place in unspecified non-institutional (private) residence as the place of occurrence of the external cause: Secondary | ICD-10-CM | POA: Diagnosis not present

## 2018-02-13 DIAGNOSIS — T426X5A Adverse effect of other antiepileptic and sedative-hypnotic drugs, initial encounter: Secondary | ICD-10-CM | POA: Diagnosis present

## 2018-02-13 DIAGNOSIS — T424X5A Adverse effect of benzodiazepines, initial encounter: Secondary | ICD-10-CM | POA: Diagnosis present

## 2018-02-13 LAB — URINALYSIS, COMPLETE (UACMP) WITH MICROSCOPIC
BILIRUBIN URINE: NEGATIVE
Glucose, UA: NEGATIVE mg/dL
HGB URINE DIPSTICK: NEGATIVE
KETONES UR: 20 mg/dL — AB
NITRITE: POSITIVE — AB
Protein, ur: NEGATIVE mg/dL
SPECIFIC GRAVITY, URINE: 1.019 (ref 1.005–1.030)
pH: 5 (ref 5.0–8.0)

## 2018-02-13 LAB — URINE DRUG SCREEN, QUALITATIVE (ARMC ONLY)
Amphetamines, Ur Screen: NOT DETECTED
BARBITURATES, UR SCREEN: NOT DETECTED
BENZODIAZEPINE, UR SCRN: POSITIVE — AB
CANNABINOID 50 NG, UR ~~LOC~~: NOT DETECTED
COCAINE METABOLITE, UR ~~LOC~~: NOT DETECTED
MDMA (Ecstasy)Ur Screen: NOT DETECTED
Methadone Scn, Ur: NOT DETECTED
OPIATE, UR SCREEN: NOT DETECTED
Phencyclidine (PCP) Ur S: NOT DETECTED
TRICYCLIC, UR SCREEN: POSITIVE — AB

## 2018-02-13 MED ORDER — SODIUM CHLORIDE 0.9 % IV SOLN
1.0000 g | Freq: Once | INTRAVENOUS | Status: AC
  Administered 2018-02-13: 1 g via INTRAVENOUS
  Filled 2018-02-13: qty 10

## 2018-02-13 MED ORDER — GABAPENTIN 600 MG PO TABS
600.0000 mg | ORAL_TABLET | Freq: Three times a day (TID) | ORAL | Status: DC
  Administered 2018-02-13 – 2018-02-14 (×2): 600 mg via ORAL
  Filled 2018-02-13 (×2): qty 1

## 2018-02-13 MED ORDER — ACETAMINOPHEN 500 MG PO TABS
1000.0000 mg | ORAL_TABLET | Freq: Two times a day (BID) | ORAL | Status: DC
  Administered 2018-02-13 – 2018-02-14 (×2): 1000 mg via ORAL
  Filled 2018-02-13 (×2): qty 2

## 2018-02-13 MED ORDER — AMITRIPTYLINE HCL 25 MG PO TABS
150.0000 mg | ORAL_TABLET | Freq: Every day | ORAL | Status: DC
  Administered 2018-02-13: 150 mg via ORAL
  Filled 2018-02-13: qty 3

## 2018-02-13 MED ORDER — CEPHALEXIN 500 MG PO CAPS
500.0000 mg | ORAL_CAPSULE | Freq: Once | ORAL | Status: AC
  Administered 2018-02-13: 500 mg via ORAL
  Filled 2018-02-13: qty 1

## 2018-02-13 MED ORDER — ALPRAZOLAM 1 MG PO TABS
1.0000 mg | ORAL_TABLET | Freq: Three times a day (TID) | ORAL | Status: DC
  Administered 2018-02-13: 1 mg via ORAL
  Filled 2018-02-13: qty 2

## 2018-02-13 NOTE — ED Notes (Signed)
Pt with a phone call from her son - awake and talking to him on the phone   NAD observed

## 2018-02-13 NOTE — BH Assessment (Signed)
Writer called and left a HIPPA Compliant message with son Cristie Hem Abramo-[phone]), requesting a return phone call.

## 2018-02-13 NOTE — ED Notes (Signed)
Pt up to the bathroom to give urine sample. Pt is calm and cooperative. Pt states she is cold. Pt placed back in bed and covered with multiple blankets.

## 2018-02-13 NOTE — ED Provider Notes (Signed)
-----------------------------------------   8:31 PM on 02/13/2018 -----------------------------------------  I took over care of this patient from the prior providers.  Patient was pending the remainder of her medical work-up as well as psych evaluation.  Patient's UA revealed findings consistent with UTI.  SOC has evaluated the patient from a psychiatric perspective, and the provider advises that the patient is not a danger to self or others and rescinded the IVC placed yesterday.  On reassessment, the patient still appears somewhat weak, although more cooperative and calm than yesterday.  Since the patient does not appear to be acutely psychotic, her presentation is much more consistent with acute delirium and possible encephalopathy from UTI.  Although she has improved, given the severity of her apparent delirium yesterday and earlier today, I will admit the patient for observation and initiation of treatment of the UTI until her mental status fully returns to baseline.  The patient agrees with this plan.  I signed the patient out to the hospitalist Dr. Duane Boston.     Arta Silence, MD 02/13/18 2033

## 2018-02-13 NOTE — ED Notes (Signed)
ED Is the patient under IVC or is there intent for IVC: Yes.   Is the patient medically cleared: Yes.   Is there vacancy in the ED BHU: Yes.   Is the population mix appropriate for patient:  She uses a walker at home - fall risk here  Is the patient awaiting placement in inpatient or outpatient setting:  Has the patient had a psychiatric consult:  Consult pending Survey of unit performed for contraband, proper placement and condition of furniture, tampering with fixtures in bathroom, shower, and each patient room: Yes.  ; Findings:  APPEARANCE/BEHAVIOR Calm and cooperative NEURO ASSESSMENT Orientation: oriented x3  Denies pain Hallucinations: No.None noted (Hallucinations) Speech: Normal Gait: normal  Stand by assistance provided  RESPIRATORY ASSESSMENT Even  Unlabored respirations  CARDIOVASCULAR ASSESSMENT Pulses equal   regular rate  Skin warm and dry   GASTROINTESTINAL ASSESSMENT no GI complaint EXTREMITIES Full ROM  PLAN OF CARE Provide calm/safe environment. Vital signs assessed twice daily. ED BHU Assessment once each 12-hour shift. Collaborate with TTS daily or as condition indicates. Assure the ED provider has rounded once each shift. Provide and encourage hygiene. Provide redirection as needed. Assess for escalating behavior; address immediately and inform ED provider.  Assess family dynamic and appropriateness for visitation as needed: Yes.  ; If necessary, describe findings:  Educate the patient/family about BHU procedures/visitation: Yes.  ; If necessary, describe findings:

## 2018-02-13 NOTE — ED Notes (Signed)
Pt. Talking to [facility] at this time.

## 2018-02-13 NOTE — ED Notes (Signed)
BEHAVIORAL HEALTH ROUNDING Patient sleeping: No. Patient alert and oriented: yes Behavior appropriate: Yes.  ; If no, describe:  Nutrition and fluids offered: yes Toileting and hygiene offered: Yes  Sitter present: q15 minute observations and security monitoring Law enforcement present: Yes    

## 2018-02-13 NOTE — ED Notes (Signed)
Update provided to her son Diana West  [phone]  Psych consult pending

## 2018-02-13 NOTE — ED Notes (Signed)
IVC SOC called

## 2018-02-13 NOTE — H&P (Signed)
[facility] NAME: Diana West    MR#:  [id number]  DATE OF BIRTH:  [birth year]  DATE OF ADMISSION:  02/12/2018  PRIMARY CARE PHYSICIAN: Patient, No Pcp Per   REQUESTING/REFERRING PHYSICIAN:   CHIEF COMPLAINT:   Chief Complaint  Patient presents with  . Altered Mental Status    HISTORY OF PRESENT ILLNESS: Diana West  is a 62 y.o. female with a known history of anxiety disorder, TIA, frequent falls. Patient was brought to emergency room for acute onset of confusion and agitation.  She is not able to provide any history due, to confusion.  Most of the information was taken from reviewing the medical records and from discussion with emergency room physician. She was evaluated by psychiatry and found to be not a danger to self or others.  Blood test done emergency room are remarkable for elevated creatinine level at 1.11.  UA is positive for UTI. Brain CT and chest x-ray, reviewed by myself, are essentially unremarkable. Patient is admitted for further evaluation and treatment.   PAST MEDICAL HISTORY:   Past Medical History:  Diagnosis Date  . Anxiety   . Arthritis   . Cardiac arrhythmia   . Falls frequently   . Palpitation   . Shingles    she states "they are inside and not contagious"  . TIA (transient ischemic attack)     PAST SURGICAL HISTORY:  Past Surgical History:  Procedure Laterality Date  . ABDOMINAL HYSTERECTOMY    . CESAREAN SECTION    . CHOLECYSTECTOMY      SOCIAL HISTORY:  Social History   Tobacco Use  . Smoking status: Current Some Day Smoker    Packs/day: 0.25    Types: Cigarettes  . Smokeless tobacco: Never Used  Substance Use Topics  . Alcohol use: No    FAMILY HISTORY:  Family History  Problem Relation Age of Onset  . CAD Mother   . CAD Father     DRUG ALLERGIES:  Allergies  Allergen Reactions  . Codeine Itching and Rash  . Toradol [Ketorolac Tromethamine] Rash     REVIEW OF SYSTEMS:   Not able to obtain due to patient being confused.  MEDICATIONS AT HOME:  Prior to Admission medications   Medication Sig Start Date End Date Taking? Authorizing Provider  acetaminophen (TYLENOL) 500 MG tablet Take 2 tablets (1,000 mg total) by mouth 2 (two) times daily. 06/27/17  Yes Plonk, Gwyndolyn Saxon, MD  ALPRAZolam Duanne Moron) 1 MG tablet Take 1 mg by mouth 3 (three) times daily.  04/06/17  Yes [provider]  amitriptyline (ELAVIL) 150 MG tablet Take 150 mg by mouth at bedtime.    Yes [provider]  aspirin 81 MG tablet Take 1 tablet (81 mg total) by mouth daily. 07/06/17  Yes Plonk, Gwyndolyn Saxon, MD  Cholecalciferol (VITAMIN D-3) 1000 units CAPS Take 1 capsule (1,000 Units total) by mouth daily. 06/27/17  Yes Plonk, Gwyndolyn Saxon, MD  gabapentin (NEURONTIN) 600 MG tablet Take 1 tablet (600 mg total) by mouth 3 (three) times daily. 06/26/17  Yes Plonk, Gwyndolyn Saxon, MD  levothyroxine (SYNTHROID, LEVOTHROID) 100 MCG tablet Take 1 tablet (100 mcg total) by mouth daily before breakfast. Patient taking differently: Take 125 mcg by mouth daily before breakfast.  09/11/17  Yes Plonk, Gwyndolyn Saxon, MD  Multiple Vitamin (MULTIVITAMIN) capsule Take 1 capsule by mouth daily.   Yes [provider]  sertraline (ZOLOFT) 100 MG tablet Take 1 tablet by mouth daily. 05/01/17  Yes [provider]  hydrOXYzine (ATARAX/VISTARIL) 25 MG tablet Take 0.5 tablets (12.5 mg total) 3 (three) times daily as needed by mouth for itching. Patient not taking: Reported on 02/12/2018 08/25/17   Marylene Land, NP      PHYSICAL EXAMINATION:   VITAL SIGNS: Blood pressure (!) 102/53, pulse (!) 58, temperature 97.9 F (36.6 C), temperature source Oral, resp. rate 14, height 5\' 5"  (1.651 m), weight 53.5 kg (118 lb), SpO2 93 %.  GENERAL:  62 y.o.-year-old patient lying in the bed with no acute distress.  EYES: Pupils equal, round, reactive to light and accommodation. No scleral icterus. HEENT:  Head atraumatic, normocephalic. Oropharynx and nasopharynx clear.  NECK:  Supple, no jugular venous distention. No thyroid enlargement, no tenderness.  LUNGS: Reduced breath sounds bilaterally, no wheezing, rales,rhonchi or crepitation. No use of accessory muscles of respiration.  CARDIOVASCULAR: S1, S2 normal. No murmurs, rubs, or gallops.  ABDOMEN: Soft, nontender, nondistended. Bowel sounds present. No organomegaly or mass.  EXTREMITIES: No pedal edema, cyanosis, or clubbing.  NEUROLOGIC EXAM: Is limited, due to patient being confused.  No focal weakness.  Gait is unstable. PSYCHIATRIC: The patient is alert, but disoriented and drowsy.  SKIN: No obvious rash, lesion, or ulcer.   LABORATORY PANEL:   CBC Recent Labs  Lab 02/12/18 1641  WBC 7.9  HGB 9.8*  HCT 29.9*  PLT 315  MCV 81.8  MCH 26.8  MCHC 32.8  RDW 18.8*  LYMPHSABS 1.8  MONOABS 0.5  EOSABS 0.2  BASOSABS 0.1   ------------------------------------------------------------------------------------------------------------------  Chemistries  Recent Labs  Lab 02/12/18 1641  NA 136  K 3.5  CL 104  CO2 22  GLUCOSE 87  BUN 35*  CREATININE 1.11*  CALCIUM 9.1  AST 36  ALT 18  ALKPHOS 81  BILITOT 0.5   ------------------------------------------------------------------------------------------------------------------ estimated creatinine clearance is 44.4 mL/min (A) (by C-G formula based on SCr of 1.11 mg/dL (H)). ------------------------------------------------------------------------------------------------------------------ No results for input(s): TSH, T4TOTAL, T3FREE, THYROIDAB in the last 72 hours.  Invalid input(s): FREET3   Coagulation profile No results for input(s): INR, PROTIME in the last 168 hours. ------------------------------------------------------------------------------------------------------------------- No results for input(s): DDIMER in the last 72  hours. -------------------------------------------------------------------------------------------------------------------  Cardiac Enzymes Recent Labs  Lab 02/12/18 1641  TROPONINI <0.03   ------------------------------------------------------------------------------------------------------------------ Invalid input(s): POCBNP  ---------------------------------------------------------------------------------------------------------------  Urinalysis    Component Value Date/Time   COLORURINE YELLOW (A) 02/12/2018 1641   APPEARANCEUR HAZY (A) 02/12/2018 1641   LABSPEC 1.019 02/12/2018 1641   PHURINE 5.0 02/12/2018 1641   GLUCOSEU NEGATIVE 02/12/2018 1641   HGBUR NEGATIVE 02/12/2018 1641   BILIRUBINUR NEGATIVE 02/12/2018 1641   KETONESUR 20 (A) 02/12/2018 1641   PROTEINUR NEGATIVE 02/12/2018 1641   NITRITE POSITIVE (A) 02/12/2018 1641   LEUKOCYTESUR MODERATE (A) 02/12/2018 1641     RADIOLOGY: Ct Head Wo Contrast  Result Date: 02/12/2018 CLINICAL DATA:  Altered mental status. EXAM: CT HEAD WITHOUT CONTRAST TECHNIQUE: Contiguous axial images were obtained from the base of the skull through the vertex without intravenous contrast. COMPARISON:  Head CT 07/22/2017 FINDINGS: Brain: No mass lesion, intraparenchymal hemorrhage or extra-axial collection. No evidence of acute cortical infarct. Normal appearance of the brain parenchyma and extra axial spaces for age. Vascular: No hyperdense vessel or unexpected vascular calcification. Skull: Normal visualized skull base, calvarium and extracranial soft tissues. Sinuses/Orbits: No sinus fluid levels or advanced mucosal thickening. No mastoid effusion. Normal orbits. IMPRESSION: Normal brain. Electronically Signed   By: Ulyses Jarred M.D.   On: 02/12/2018 15:46  Dg Chest Portable 1 View  Result Date: 02/12/2018 CLINICAL DATA:  Altered mental status and weakness EXAM: PORTABLE CHEST 1 VIEW COMPARISON:  Chest CT 10/31/2017 FINDINGS: The heart  size and mediastinal contours are within normal limits. Both lungs are clear. The visualized skeletal structures are unremarkable. IMPRESSION: No active disease. Electronically Signed   By: Ulyses Jarred M.D.   On: 02/12/2018 15:59    EKG: Orders placed or performed during the hospital encounter of 02/12/18  . ED EKG  . ED EKG  . EKG 12-Lead  . EKG 12-Lead    IMPRESSION AND PLAN:  1.  Acute encephalopathy, likely metabolic secondary to acute UTI, acute renal failure and side effects from medications, Xanax, amitriptyline, gabapentin, hydroxyzine.  We will start IV fluids and IV antibiotics.  Will avoid medications with sedative effect.  Continue to monitor clinically closely, while treating the underlying disease. 2.  Acute UTI, will start patient on Rocephin IV and IV fluids.  3.  Acute renal failure, likely prerenal, secondary to poor p.o. intake.  We will start IV fluids and monitor kidney function closely.  Avoid nephrotoxic medications. 4.  Anxiety disorder.  Will attempt to taper down Xanax and amitriptyline dose, up with patient's confusion and drowsiness.  All the records are reviewed and case discussed with ED provider. Management plans discussed with the patient and she is in agreement.  CODE STATUS: FULL Code Status History    Date Active Date Inactive Code Status Order ID Comments User Context   07/22/2017 1353 07/24/2017 1733 Full Code [id number]  Gladstone Lighter, MD Inpatient       TOTAL TIME TAKING CARE OF THIS PATIENT: 45 minutes.    Amelia Jo M.D on 02/13/2018 at 11:36 PM  Between 7am to 6pm - Pager - [phone]  After 6pm go to [email or web address] - password [email or web address] [facility]  Office  [phone]  CC: Primary care physician; Patient, No Pcp Per

## 2018-02-13 NOTE — ED Notes (Signed)
Pt. Finished SOC. 

## 2018-02-13 NOTE — ED Notes (Signed)
BEHAVIORAL HEALTH ROUNDING Patient sleeping: Yes.   Patient alert and oriented: eyes closed  Appears to be asleep Behavior appropriate: Yes.  ; If no, describe:  Nutrition and fluids offered: Yes  Toileting and hygiene offered: sleeping Sitter present: q 15 minute observations and security monitoring Law enforcement present: yes    ENVIRONMENTAL ASSESSMENT Potentially harmful objects out of patient reach: Yes.   Personal belongings secured: Yes.   Patient dressed in hospital provided attire only: Yes.   Plastic bags out of patient reach: Yes.   Patient care equipment (cords, cables, call bells, lines, and drains) shortened, removed, or accounted for: Yes.   Equipment and supplies removed from bottom of stretcher: Yes.   Potentially toxic materials out of patient reach: Yes.   Sharps container removed or out of patient reach: Yes.

## 2018-02-13 NOTE — BH Assessment (Signed)
Writer called and left a HIPPA Compliant message with son Cristie Hem [phone]), requesting a return phone call.

## 2018-02-13 NOTE — ED Notes (Signed)
SOC recommends discharge.

## 2018-02-13 NOTE — ED Notes (Signed)
Patient observed lying in bed with eyes closed  Even, unlabored respirations observed   NAD pt appears to be sleeping  I will continue to monitor along with every 15 minute visual observations and ongoing security monitoring    

## 2018-02-13 NOTE — ED Notes (Signed)
Pt given breakfast tray and sprite

## 2018-02-13 NOTE — ED Provider Notes (Signed)
-----------------------------------------   5:10 AM on 02/13/2018 -----------------------------------------   Blood pressure 123/71, pulse 93, temperature 98.3 F (36.8 C), temperature source Oral, resp. rate 16, height 5\' 5"  (1.651 m), weight 53.5 kg (118 lb), SpO2 98 %.  The patient had no acute events since last update.  Calm and cooperative at this time.  Disposition is pending Psychiatry/Behavioral Medicine team recommendations.     Nena Polio, MD 02/13/18 [phone]

## 2018-02-13 NOTE — BH Assessment (Signed)
Assessment Note  Diana West is an 62 y.o. female who presents to the ER via law enforcement due to neighbors calling them. Patient was found outside her home, confused, not knowing her name and belligerent. Upon arrival to the ER she was still belligerent and irritable. With this Probation officer, patient was unable to remember the events that took place that brought her to the ER.   Per the report of the patient's son ([phone]), the patient behaviors have changed within the last four months. She's been responding to internal stimuli, paranoid and creating stories that aren't true. Son states, the behaviors take place throughout the day. The first and last time this happened was approximately two years ago. The symptoms were the results of a medication change and had to be hospitalized. Son reports, since then she's hasn't been the same but functional.  During the interview the patient was calm, cooperative and pleasant. She denied SI/HI and AV/H.Marland Kitchen   Diagnosis: Confusion  Past Medical History:  Past Medical History:  Diagnosis Date  . Anxiety   . Arthritis   . Cardiac arrhythmia   . Falls frequently   . Palpitation   . Shingles    she states "they are inside and not contagious"  . TIA (transient ischemic attack)     Past Surgical History:  Procedure Laterality Date  . ABDOMINAL HYSTERECTOMY    . CESAREAN SECTION    . CHOLECYSTECTOMY      Family History:  Family History  Problem Relation Age of Onset  . CAD Mother   . CAD Father     Social History:  reports that she has been smoking cigarettes.  She has been smoking about 0.25 packs per day. She has never used smokeless tobacco. She reports that she does not drink alcohol or use drugs.  Additional Social History:  Alcohol / Drug Use Pain Medications: See PTA Prescriptions: See PTA Over the Counter: See PTA History of alcohol / drug use?: No history of alcohol / drug abuse Longest period of sobriety (when/how long):  n/a Negative Consequences of Use: (n/a) Withdrawal Symptoms: (n/a)  CIWA: CIWA-Ar BP: (!) 102/53 Pulse Rate: (!) 58 COWS:    Allergies:  Allergies  Allergen Reactions  . Codeine Itching and Rash  . Toradol [Ketorolac Tromethamine] Rash    Home Medications:  (Not in a hospital admission)  OB/GYN Status:  No LMP recorded. Patient has had a hysterectomy.  General Assessment Data Location of Assessment: [facility] ED TTS Assessment: In system Is this a Tele or Face-to-Face Assessment?: Face-to-Face Is this an Initial Assessment or a Re-assessment for this encounter?: Initial Assessment Marital status: Single Maiden name: n/a Is patient pregnant?: No Pregnancy Status: No Living Arrangements: Alone Can pt return to current living arrangement?: Yes Admission Status: Involuntary Is patient capable of signing voluntary admission?: No(Under IVC) Referral Source: Self/Family/Friend Insurance type: Medicaid  Medical Screening Exam ([place]) Medical Exam completed: Yes  Crisis Care Plan Living Arrangements: Alone Legal Guardian: Other:(Self) Name of Psychiatrist: Reports of none Name of Therapist: Reports of none  Education Status Is patient currently in school?: No Is the patient employed, unemployed or receiving disability?: Unemployed(Retired)  Risk to self with the past 6 months Suicidal Ideation: No Has patient been a risk to self within the past 6 months prior to admission? : No Suicidal Intent: No Has patient had any suicidal intent within the past 6 months prior to admission? : No Is patient at risk for suicide?: No Suicidal Plan?: No  Has patient had any suicidal plan within the past 6 months prior to admission? : No Access to Means: No What has been your use of drugs/alcohol within the last 12 months?: Reports of none Previous Attempts/Gestures: No How many times?: 0 Other Self Harm Risks: Reports of none Triggers for Past Attempts: None  known Intentional Self Injurious Behavior: None Family Suicide History: No Recent stressful life event(s): Other (Comment) Persecutory voices/beliefs?: No Depression: No Depression Symptoms: (Reports of none) Substance abuse history and/or treatment for substance abuse?: No Suicide prevention information given to non-admitted patients: Not applicable  Risk to Others within the past 6 months Homicidal Ideation: No Does patient have any lifetime risk of violence toward others beyond the six months prior to admission? : No Thoughts of Harm to Others: No Current Homicidal Intent: No Current Homicidal Plan: No Access to Homicidal Means: No Identified Victim: Reports of none History of harm to others?: No Assessment of Violence: None Noted Violent Behavior Description: Reports of none Does patient have access to weapons?: No Criminal Charges Pending?: No Does patient have a court date: No Is patient on probation?: No  Psychosis Hallucinations: Auditory, Visual Delusions: Unspecified  Mental Status Report Appearance/Hygiene: Unremarkable, In scrubs Eye Contact: Fair Motor Activity: Freedom of movement, Unremarkable Speech: Logical/coherent, Unremarkable Level of Consciousness: Alert Mood: Pleasant Affect: Appropriate to circumstance Anxiety Level: Minimal Thought Processes: Coherent, Relevant Judgement: Partial Orientation: Person, Place, Appropriate for developmental age Obsessive Compulsive Thoughts/Behaviors: None  Cognitive Functioning Concentration: Decreased Memory: Remote Intact, Recent Impaired Is patient IDD: No Is patient DD?: No Insight: Fair Impulse Control: Fair Appetite: Poor Have you had any weight changes? : No Change Sleep: No Change Total Hours of Sleep: 8 Vegetative Symptoms: None  ADLScreening [facility] Assessment Services) Patient's cognitive ability adequate to safely complete daily activities?: Yes Patient able to express need for assistance with  ADLs?: Yes Independently performs ADLs?: Yes (appropriate for developmental age)  Prior Inpatient Therapy Prior Inpatient Therapy: No  Prior Outpatient Therapy Prior Outpatient Therapy: No Does patient have an ACCT team?: No Does patient have Intensive In-House Services?  : No Does patient have [facility] services? : No Does patient have P4CC services?: No  ADL Screening (condition at time of admission) Patient's cognitive ability adequate to safely complete daily activities?: Yes Is the patient deaf or have difficulty hearing?: No Does the patient have difficulty seeing, even when wearing glasses/contacts?: No Does the patient have difficulty concentrating, remembering, or making decisions?: No Patient able to express need for assistance with ADLs?: Yes Does the patient have difficulty dressing or bathing?: No Independently performs ADLs?: Yes (appropriate for developmental age) Does the patient have difficulty walking or climbing stairs?: No Weakness of Legs: None Weakness of Arms/Hands: None  Home Assistive Devices/Equipment Home Assistive Devices/Equipment: None  Therapy Consults (therapy consults require a physician order) PT Evaluation Needed: No OT Evalulation Needed: No SLP Evaluation Needed: No Abuse/Neglect Assessment (Assessment to be complete while patient is alone) Abuse/Neglect Assessment Can Be Completed: Yes Physical Abuse: Denies Verbal Abuse: Denies Sexual Abuse: Denies Exploitation of patient/patient's resources: Denies Self-Neglect: Denies Values / Beliefs Cultural Requests During Hospitalization: None Spiritual Requests During Hospitalization: None Consults Spiritual Care Consult Needed: No Social Work Consult Needed: No         Child/Adolescent Assessment Running Away Risk: Denies(Patient is an adult)  Disposition:     On Site Evaluation by:   Reviewed with Physician:    Gunnar Fusi MS, LCAS, LPC, [place], CCSI Therapeutic Triage  Specialist 02/13/2018 8:33 PM

## 2018-02-13 NOTE — ED Notes (Signed)
Pt given lunch tray.

## 2018-02-14 ENCOUNTER — Encounter: Payer: Self-pay | Admitting: *Deleted

## 2018-02-14 ENCOUNTER — Other Ambulatory Visit: Payer: Self-pay

## 2018-02-14 ENCOUNTER — Inpatient Hospital Stay: Payer: Medicare Other

## 2018-02-14 LAB — GLUCOSE, CAPILLARY: Glucose-Capillary: 94 mg/dL (ref 65–99)

## 2018-02-14 LAB — BASIC METABOLIC PANEL
ANION GAP: 5 (ref 5–15)
BUN: 29 mg/dL — ABNORMAL HIGH (ref 6–20)
CALCIUM: 8.7 mg/dL — AB (ref 8.9–10.3)
CHLORIDE: 109 mmol/L (ref 101–111)
CO2: 25 mmol/L (ref 22–32)
Creatinine, Ser: 0.87 mg/dL (ref 0.44–1.00)
GFR calc Af Amer: >60 mL/min (ref >60)
GFR calc non Af Amer: >60 mL/min (ref >60)
Glucose, Bld: 114 mg/dL — ABNORMAL HIGH (ref 65–99)
Potassium: 3.7 mmol/L (ref 3.5–5.1)
SODIUM: 139 mmol/L (ref 135–145)

## 2018-02-14 LAB — CBC
HCT: 28.8 % — ABNORMAL LOW (ref 35.0–47.0)
HEMOGLOBIN: 9.6 g/dL — AB (ref 12.0–16.0)
MCH: 27.6 pg (ref 26.0–34.0)
MCHC: 33.3 g/dL (ref 32.0–36.0)
MCV: 83 fL (ref 80.0–100.0)
Platelets: 261 10*3/uL (ref 150–440)
RBC: 3.48 MIL/uL — ABNORMAL LOW (ref 3.80–5.20)
RDW: 18.8 % — ABNORMAL HIGH (ref 11.5–14.5)
WBC: 6.1 10*3/uL (ref 3.6–11.0)

## 2018-02-14 MED ORDER — AMITRIPTYLINE HCL 150 MG PO TABS: 75.0000 mg | ORAL_TABLET | Freq: Every day | ORAL | 0 refills | Status: DC

## 2018-02-14 MED ORDER — VITAMIN D 1000 UNITS PO TABS
1000.0000 [IU] | ORAL_TABLET | Freq: Every day | ORAL | Status: DC
  Administered 2018-02-14: 1000 [IU] via ORAL
  Filled 2018-02-14: qty 1

## 2018-02-14 MED ORDER — SODIUM CHLORIDE 0.9 % IV SOLN
1.0000 g | INTRAVENOUS | Status: DC
  Filled 2018-02-14: qty 10

## 2018-02-14 MED ORDER — SODIUM CHLORIDE 0.9 % IV SOLN
INTRAVENOUS | Status: DC
  Administered 2018-02-14: 01:00:00 via INTRAVENOUS

## 2018-02-14 MED ORDER — HYDROCODONE-ACETAMINOPHEN 5-325 MG PO TABS
1.0000 | ORAL_TABLET | ORAL | Status: DC | PRN
  Administered 2018-02-14: 1 via ORAL
  Filled 2018-02-14: qty 1

## 2018-02-14 MED ORDER — ONDANSETRON HCL 4 MG PO TABS: 4.0000 mg | ORAL_TABLET | Freq: Four times a day (QID) | ORAL | Status: DC | PRN

## 2018-02-14 MED ORDER — ONDANSETRON HCL 4 MG/2ML IJ SOLN: 4.0000 mg | Freq: Four times a day (QID) | INTRAMUSCULAR | Status: DC | PRN

## 2018-02-14 MED ORDER — CEFDINIR 300 MG PO CAPS: 300.0000 mg | ORAL_CAPSULE | Freq: Two times a day (BID) | ORAL | 0 refills | Status: DC

## 2018-02-14 MED ORDER — ASPIRIN EC 81 MG PO TBEC
81.0000 mg | DELAYED_RELEASE_TABLET | Freq: Every day | ORAL | Status: DC
  Administered 2018-02-14: 81 mg via ORAL
  Filled 2018-02-14: qty 1

## 2018-02-14 MED ORDER — MELOXICAM 7.5 MG PO TABS
7.5000 mg | ORAL_TABLET | Freq: Every day | ORAL | Status: DC
  Administered 2018-02-14: 7.5 mg via ORAL
  Filled 2018-02-14: qty 1

## 2018-02-14 MED ORDER — ALPRAZOLAM 1 MG PO TABS
1.0000 mg | ORAL_TABLET | Freq: Two times a day (BID) | ORAL | Status: DC | PRN
  Administered 2018-02-14: 1 mg via ORAL
  Filled 2018-02-14: qty 1

## 2018-02-14 MED ORDER — ACETAMINOPHEN 650 MG RE SUPP: 650.0000 mg | Freq: Four times a day (QID) | RECTAL | Status: DC | PRN

## 2018-02-14 MED ORDER — ACETAMINOPHEN 325 MG PO TABS: 650.0000 mg | ORAL_TABLET | Freq: Four times a day (QID) | ORAL | Status: DC | PRN

## 2018-02-14 MED ORDER — BISACODYL 5 MG PO TBEC: 5.0000 mg | DELAYED_RELEASE_TABLET | Freq: Every day | ORAL | Status: DC | PRN

## 2018-02-14 MED ORDER — SERTRALINE HCL 50 MG PO TABS
100.0000 mg | ORAL_TABLET | Freq: Every day | ORAL | Status: DC
  Administered 2018-02-14: 100 mg via ORAL
  Filled 2018-02-14: qty 2

## 2018-02-14 MED ORDER — MULTIVITAMINS PO CAPS: 1.0000 | ORAL_CAPSULE | Freq: Every day | ORAL | Status: DC

## 2018-02-14 MED ORDER — TRAZODONE HCL 50 MG PO TABS: 25.0000 mg | ORAL_TABLET | Freq: Every evening | ORAL | Status: DC | PRN

## 2018-02-14 MED ORDER — HEPARIN SODIUM (PORCINE) 5000 UNIT/ML IJ SOLN
5000.0000 [IU] | Freq: Three times a day (TID) | INTRAMUSCULAR | Status: DC
  Filled 2018-02-14: qty 1

## 2018-02-14 MED ORDER — LEVOTHYROXINE SODIUM 25 MCG PO TABS
125.0000 ug | ORAL_TABLET | Freq: Every day | ORAL | Status: DC
  Administered 2018-02-14: 09:00:00 125 ug via ORAL
  Filled 2018-02-14: qty 1

## 2018-02-14 MED ORDER — AMITRIPTYLINE HCL 25 MG PO TABS: 75.0000 mg | ORAL_TABLET | Freq: Every day | ORAL | Status: DC

## 2018-02-14 MED ORDER — DOCUSATE SODIUM 100 MG PO CAPS
100.0000 mg | ORAL_CAPSULE | Freq: Two times a day (BID) | ORAL | Status: DC
  Administered 2018-02-14: 100 mg via ORAL
  Filled 2018-02-14: qty 1

## 2018-02-14 MED ORDER — ADULT MULTIVITAMIN W/MINERALS CH
1.0000 | ORAL_TABLET | Freq: Every day | ORAL | Status: DC
  Administered 2018-02-14: 09:00:00 1 via ORAL
  Filled 2018-02-14: qty 1

## 2018-02-14 MED ORDER — MELOXICAM 7.5 MG PO TABS: 7.5000 mg | ORAL_TABLET | Freq: Every day | ORAL | 0 refills | Status: DC

## 2018-02-14 NOTE — Discharge Summary (Signed)
[facility] [place] NAME: Diana West    MR#:  [id number]  DATE OF BIRTH:  12-14-1955  DATE OF ADMISSION:  02/12/2018 ADMITTING PHYSICIAN: Amelia Jo, MD  DATE OF DISCHARGE: No discharge date for patient encounter.  PRIMARY CARE PHYSICIAN: Patient, No Pcp Per    ADMISSION DIAGNOSIS:  Urinary tract infection without hematuria, site unspecified [N39.0] Altered mental status, unspecified altered mental status type [R41.82]  DISCHARGE DIAGNOSIS:  Active Problems:   Acute UTI   SECONDARY DIAGNOSIS:   Past Medical History:  Diagnosis Date  . Anxiety   . Arthritis   . Cardiac arrhythmia   . Falls frequently   . Palpitation   . Shingles    she states "they are inside and not contagious"  . TIA (transient ischemic attack)     HOSPITAL COURSE:  1.  Acute encephalopathy Resolved likely secondary to polypharmacy and UTI  2.  Acute UTI Treated empirically in house with IV Rocephin and IV fluids Urine culture pending at the time of discharge, patient will be transitioned to Ceftin ear twice daily for 3-day course, to follow-up with primary care provider for reevaluation in 3 to 5 days for reevaluation/follow-up and urine cultures  3.  Acute renal failure likely prerenal, secondary to poor p.o. intake Resolved Treated with IV fluids for rehydration  4.  Anxiety disorder Stable While in house medications were tapered downward  5.  Acute right knee pain Right knee ultrasound noted for ganglion cyst/possible Baker's cyst Started on Mobic, to follow with orthopedic surgery status post discharge for reevaluation Will have HHPT status post discharge   DISCHARGE CONDITIONS:   stable, tolerating diet, ambulating without difficulty discussion with nursing staff, for discharge home with outpatient follow-up with primary care provider in 3 to 5 days for reevaluation, orthopedic surgery 1 week, for more specific details please  see chart  CONSULTS OBTAINED:    DRUG ALLERGIES:   Allergies  Allergen Reactions  . Codeine Itching and Rash  . Toradol [Ketorolac Tromethamine] Rash    DISCHARGE MEDICATIONS:   Allergies as of 02/14/2018      Reactions   Codeine Itching, Rash   Toradol [ketorolac Tromethamine] Rash      Medication List    TAKE these medications   acetaminophen 500 MG tablet Commonly known as:  TYLENOL Take 2 tablets (1,000 mg total) by mouth 2 (two) times daily.   ALPRAZolam 1 MG tablet Commonly known as:  XANAX Take 1 mg by mouth 3 (three) times daily.   amitriptyline 150 MG tablet Commonly known as:  ELAVIL Take 0.5 tablets (75 mg total) by mouth at bedtime. What changed:  how much to take   aspirin 81 MG tablet Take 1 tablet (81 mg total) by mouth daily.   cefdinir 300 MG capsule Commonly known as:  OMNICEF Take 1 capsule (300 mg total) by mouth 2 (two) times daily.   gabapentin 600 MG tablet Commonly known as:  NEURONTIN Take 1 tablet (600 mg total) by mouth 3 (three) times daily.   hydrOXYzine 25 MG tablet Commonly known as:  ATARAX/VISTARIL Take 0.5 tablets (12.5 mg total) 3 (three) times daily as needed by mouth for itching.   levothyroxine 100 MCG tablet Commonly known as:  SYNTHROID, LEVOTHROID Take 1 tablet (100 mcg total) by mouth daily before breakfast. What changed:  how much to take   meloxicam 7.5 MG tablet Commonly known as:  MOBIC Take 1 tablet (7.5 mg total)  by mouth daily.   multivitamin capsule Take 1 capsule by mouth daily.   sertraline 100 MG tablet Commonly known as:  ZOLOFT Take 1 tablet by mouth daily.   Vitamin D-3 1000 units Caps Take 1 capsule (1,000 Units total) by mouth daily.        DISCHARGE INSTRUCTIONS:   If you experience worsening of your admission symptoms, develop shortness of breath, life threatening emergency, suicidal or homicidal thoughts you must seek medical attention immediately by calling 911 or calling your MD  immediately  if symptoms less severe.  You Must read complete instructions/literature along with all the possible adverse reactions/side effects for all the Medicines you take and that have been prescribed to you. Take any new Medicines after you have completely understood and accept all the possible adverse reactions/side effects.   Please note  You were cared for by a hospitalist during your hospital stay. If you have any questions about your discharge medications or the care you received while you were in the hospital after you are discharged, you can call the unit and asked to speak with the hospitalist on call if the hospitalist that took care of you is not available. Once you are discharged, your primary care physician will handle any further medical issues. Please note that NO REFILLS for any discharge medications will be authorized once you are discharged, as it is imperative that you return to your primary care physician (or establish a relationship with a primary care physician if you do not have one) for your aftercare needs so that they can reassess your need for medications and monitor your lab values.    Today   CHIEF COMPLAINT:   Chief Complaint  Patient presents with  . Altered Mental Status    HISTORY OF PRESENT ILLNESS:  62 y.o. female with a known history of anxiety disorder, TIA, frequent falls. Patient was brought to emergency room for acute onset of confusion and agitation.  She is not able to provide any history due, to confusion.  Most of the information was taken from reviewing the medical records and from discussion with emergency room physician. She was evaluated by psychiatry and found to be not a danger to self or others.  Blood test done emergency room are remarkable for elevated creatinine level at 1.11.  UA is positive for UTI. Brain CT and chest x-ray, reviewed by myself, are essentially unremarkable. Patient is admitted for further evaluation and  treatment.    VITAL SIGNS:  Blood pressure (!) 99/54, pulse 76, temperature 98.1 F (36.7 C), temperature source Oral, resp. rate 18, height 5\' 7"  (1.702 m), weight 49.1 kg (108 lb 3.2 oz), SpO2 98 %.  I/O:    Intake/Output Summary (Last 24 hours) at 02/14/2018 1232 Last data filed at 02/14/2018 0402 Gross per 24 hour  Intake 197.5 ml  Output 150 ml  Net 47.5 ml    PHYSICAL EXAMINATION:  GENERAL:  62 y.o.-year-old patient lying in the bed with no acute distress.  EYES: Pupils equal, round, reactive to light and accommodation. No scleral icterus. Extraocular muscles intact.  HEENT: Head atraumatic, normocephalic. Oropharynx and nasopharynx clear.  NECK:  Supple, no jugular venous distention. No thyroid enlargement, no tenderness.  LUNGS: Normal breath sounds bilaterally, no wheezing, rales,rhonchi or crepitation. No use of accessory muscles of respiration.  CARDIOVASCULAR: S1, S2 normal. No murmurs, rubs, or gallops.  ABDOMEN: Soft, non-tender, non-distended. Bowel sounds present. No organomegaly or mass.  EXTREMITIES: No pedal edema, cyanosis, or clubbing.  NEUROLOGIC: Cranial nerves II through XII are intact. Muscle strength 5/5 in all extremities. Sensation intact. Gait not checked.  PSYCHIATRIC: The patient is alert and oriented x 3.  SKIN: No obvious rash, lesion, or ulcer.   DATA REVIEW:   CBC Recent Labs  Lab 02/14/18 0544  WBC 6.1  HGB 9.6*  HCT 28.8*  PLT 261    Chemistries  Recent Labs  Lab 02/12/18 1641 02/14/18 0544  NA 136 139  K 3.5 3.7  CL 104 109  CO2 22 25  GLUCOSE 87 114*  BUN 35* 29*  CREATININE 1.11* 0.87  CALCIUM 9.1 8.7*  AST 36  --   ALT 18  --   ALKPHOS 81  --   BILITOT 0.5  --     Cardiac Enzymes Recent Labs  Lab 02/12/18 1641  TROPONINI <0.03    Microbiology Results  Results for orders placed or performed during the hospital encounter of 07/22/17  Urine Culture     Status: Abnormal   Collection Time: 07/22/17 10:30 AM   Result Value Ref Range Status   Specimen Description URINE, RANDOM  Final   Special Requests NONE  Final   Culture >=100,000 COLONIES/mL ESCHERICHIA COLI (A)  Final   Report Status 07/24/2017 FINAL  Final   Organism ID, Bacteria ESCHERICHIA COLI (A)  Final      Susceptibility   Escherichia coli - MIC*    AMPICILLIN >=32 RESISTANT Resistant     CEFAZOLIN <=4 SENSITIVE Sensitive     CEFTRIAXONE <=1 SENSITIVE Sensitive     CIPROFLOXACIN >=4 RESISTANT Resistant     GENTAMICIN <=1 SENSITIVE Sensitive     IMIPENEM <=0.25 SENSITIVE Sensitive     NITROFURANTOIN <=16 SENSITIVE Sensitive     TRIMETH/SULFA >=320 RESISTANT Resistant     AMPICILLIN/SULBACTAM >=32 RESISTANT Resistant     PIP/TAZO >=128 RESISTANT Resistant     Extended ESBL NEGATIVE Sensitive     * >=100,000 COLONIES/mL ESCHERICHIA COLI    RADIOLOGY:  Ct Head Wo Contrast  Result Date: 02/12/2018 CLINICAL DATA:  Altered mental status. EXAM: CT HEAD WITHOUT CONTRAST TECHNIQUE: Contiguous axial images were obtained from the base of the skull through the vertex without intravenous contrast. COMPARISON:  Head CT 07/22/2017 FINDINGS: Brain: No mass lesion, intraparenchymal hemorrhage or extra-axial collection. No evidence of acute cortical infarct. Normal appearance of the brain parenchyma and extra axial spaces for age. Vascular: No hyperdense vessel or unexpected vascular calcification. Skull: Normal visualized skull base, calvarium and extracranial soft tissues. Sinuses/Orbits: No sinus fluid levels or advanced mucosal thickening. No mastoid effusion. Normal orbits. IMPRESSION: Normal brain. Electronically Signed   By: Ulyses Jarred M.D.   On: 02/12/2018 15:46   Dg Chest Portable 1 View  Result Date: 02/12/2018 CLINICAL DATA:  Altered mental status and weakness EXAM: PORTABLE CHEST 1 VIEW COMPARISON:  Chest CT 10/31/2017 FINDINGS: The heart size and mediastinal contours are within normal limits. Both lungs are clear. The visualized  skeletal structures are unremarkable. IMPRESSION: No active disease. Electronically Signed   By: Ulyses Jarred M.D.   On: 02/12/2018 15:59   [place] Soft Tissue Non Vascular  Result Date: 02/14/2018 CLINICAL DATA:  Pain and swelling of the right knee. EXAM: ULTRASOUND RIGHT LOWER EXTREMITY LIMITED TECHNIQUE: Ultrasound examination of the lower extremity soft tissues was performed in the area of clinical concern. COMPARISON:  None. FINDINGS: 3.7 x 1.4 x 2.4 cm fluid collection in the popliteal fossa along the lateral aspect. No internal  septations or mural nodularity. No internal Doppler flow. No other solid or cystic mass. IMPRESSION: 3.7 x 1.4 x 2.4 cm cystic area along the lateral aspect of popliteal fossa. This may reflect bursal fluid versus a ganglion cyst. Electronically Signed   By: Kathreen Devoid   On: 02/14/2018 09:41    EKG:   Orders placed or performed during the hospital encounter of 02/12/18  . ED EKG  . ED EKG  . EKG 12-Lead  . EKG 12-Lead      Management plans discussed with the patient, family and they are in agreement.  CODE STATUS:     Code Status Orders  (From admission, onward)        Start     Ordered   02/14/18 0028  Full code  Continuous     02/14/18 0027    Code Status History    Date Active Date Inactive Code Status Order ID Comments User Context   07/22/2017 1353 07/24/2017 1733 Full Code [id number]  Gladstone Lighter, MD Inpatient      TOTAL TIME TAKING CARE OF THIS PATIENT: 45 minutes.    Avel Peace Twisha Vanpelt M.D on 02/14/2018 at 12:32 PM  Between 7am to 6pm - Pager - [phone]  After 6pm go to [email or web address] - password [email or web address] [facility]  Office  [phone]  CC: Primary care physician; Patient, No Pcp Per   Note: This dictation was prepared with Dragon dictation along with smaller phrase technology. Any transcriptional errors that result from this process are unintentional.

## 2018-02-14 NOTE — Progress Notes (Signed)
Pt D/C to home with son. IV removed intact. Tele monitor removed. Education given to pt. All questions answered.

## 2018-02-14 NOTE — Care Management Note (Signed)
Case Management Note  Patient Details  Name: Donnamae Muilenburg MRN: [id number] Date of Birth: [birth year]  Subjective/Objective:   Spoke with patient. Discussed discharge planning.  Referral received from  attending for home health RN, PT and SW. Discharging today. Referral to [facility]. Looks like she may have recently been active with [facility] home health. Unable to get them on the phone. Patient states she is not currently active with them.  PCP,   Dr. Rudi Rummage at [facility]   Action/Plan:   Expected Discharge Date:  02/14/18               Expected Discharge Plan:  [place]  In-House Referral:     Discharge planning Services  CM Consult  Post Acute Care Choice:  Home Health Choice offered to:  Patient  DME Arranged:    DME Agency:     HH Arranged:  RN, Social Work [employer] Agency:  [place]  Status of Service:  Completed, signed off  If discussed at [employer], dates discussed:    Additional Comments:  Jolly Mango, RN 02/14/2018, 12:47 PM

## 2018-02-17 ENCOUNTER — Telehealth: Payer: Self-pay

## 2018-02-17 NOTE — Telephone Encounter (Signed)
Flagged on EMMI report for having questions about discharge papers, not knowing who to call about changes in condition, and not having a follow up scheduled.  Called and spoke with patient.  She mentioned she was not sure if she was supposed to follow up with a specialist regarding her knee.  Per discharge papers, she needed to call and schedule an appointment with Dr. Chilton Si at [facility].  Relayed number to patient and encouraged her to call them.  She said she did not need to follow up with [facility] as she already has a PCP in [facility] that's following her.  Encouraged her to contact Dr. Harlow Mares' office or or PCP should she experience any changes in her condition.  No other questions or concerns at this time.  I thanked her for her time and informed her that she would receive one more automated call in the next few days as a final check-in on her.

## 2018-03-14 ENCOUNTER — Other Ambulatory Visit: Payer: Self-pay

## 2018-03-14 ENCOUNTER — Inpatient Hospital Stay
Admission: EM | Admit: 2018-03-14 | Discharge: 2018-03-17 | DRG: 689 | Disposition: A | Discharge status: Home / Self Care | Payer: Medicare Other | Attending: Internal Medicine | Admitting: Internal Medicine

## 2018-03-14 ENCOUNTER — Emergency Department: Payer: Medicare Other

## 2018-03-14 DIAGNOSIS — M1990 Unspecified osteoarthritis, unspecified site: Secondary | ICD-10-CM | POA: Diagnosis present

## 2018-03-14 DIAGNOSIS — F17210 Nicotine dependence, cigarettes, uncomplicated: Secondary | ICD-10-CM | POA: Diagnosis present

## 2018-03-14 DIAGNOSIS — B9620 Unspecified Escherichia coli [E. coli] as the cause of diseases classified elsewhere: Secondary | ICD-10-CM | POA: Diagnosis present

## 2018-03-14 DIAGNOSIS — R001 Bradycardia, unspecified: Secondary | ICD-10-CM | POA: Diagnosis present

## 2018-03-14 DIAGNOSIS — I498 Other specified cardiac arrhythmias: Secondary | ICD-10-CM

## 2018-03-14 DIAGNOSIS — Z681 Body mass index (BMI) 19 or less, adult: Secondary | ICD-10-CM

## 2018-03-14 DIAGNOSIS — Z888 Allergy status to other drugs, medicaments and biological substances status: Secondary | ICD-10-CM

## 2018-03-14 DIAGNOSIS — W19XXXA Unspecified fall, initial encounter: Secondary | ICD-10-CM | POA: Diagnosis not present

## 2018-03-14 DIAGNOSIS — Z8673 Personal history of transient ischemic attack (TIA), and cerebral infarction without residual deficits: Secondary | ICD-10-CM

## 2018-03-14 DIAGNOSIS — R946 Abnormal results of thyroid function studies: Secondary | ICD-10-CM | POA: Diagnosis present

## 2018-03-14 DIAGNOSIS — R008 Other abnormalities of heart beat: Secondary | ICD-10-CM | POA: Diagnosis present

## 2018-03-14 DIAGNOSIS — L89893 Pressure ulcer of other site, stage 3: Secondary | ICD-10-CM | POA: Diagnosis present

## 2018-03-14 DIAGNOSIS — Z79899 Other long term (current) drug therapy: Secondary | ICD-10-CM

## 2018-03-14 DIAGNOSIS — Z79890 Hormone replacement therapy (postmenopausal): Secondary | ICD-10-CM

## 2018-03-14 DIAGNOSIS — L8990 Pressure ulcer of unspecified site, unspecified stage: Secondary | ICD-10-CM | POA: Diagnosis present

## 2018-03-14 DIAGNOSIS — Y92230 Patient room in hospital as the place of occurrence of the external cause: Secondary | ICD-10-CM | POA: Diagnosis not present

## 2018-03-14 DIAGNOSIS — E876 Hypokalemia: Secondary | ICD-10-CM | POA: Diagnosis present

## 2018-03-14 DIAGNOSIS — I493 Ventricular premature depolarization: Secondary | ICD-10-CM | POA: Diagnosis present

## 2018-03-14 DIAGNOSIS — N390 Urinary tract infection, site not specified: Secondary | ICD-10-CM | POA: Diagnosis not present

## 2018-03-14 DIAGNOSIS — F331 Major depressive disorder, recurrent, moderate: Secondary | ICD-10-CM | POA: Diagnosis present

## 2018-03-14 DIAGNOSIS — F23 Brief psychotic disorder: Secondary | ICD-10-CM | POA: Diagnosis present

## 2018-03-14 DIAGNOSIS — F419 Anxiety disorder, unspecified: Secondary | ICD-10-CM | POA: Diagnosis present

## 2018-03-14 DIAGNOSIS — F29 Unspecified psychosis not due to a substance or known physiological condition: Secondary | ICD-10-CM

## 2018-03-14 DIAGNOSIS — E440 Moderate protein-calorie malnutrition: Secondary | ICD-10-CM | POA: Diagnosis present

## 2018-03-14 DIAGNOSIS — Z7982 Long term (current) use of aspirin: Secondary | ICD-10-CM

## 2018-03-14 DIAGNOSIS — E860 Dehydration: Secondary | ICD-10-CM | POA: Diagnosis present

## 2018-03-14 DIAGNOSIS — G9340 Encephalopathy, unspecified: Secondary | ICD-10-CM | POA: Diagnosis present

## 2018-03-14 DIAGNOSIS — Z885 Allergy status to narcotic agent status: Secondary | ICD-10-CM

## 2018-03-14 DIAGNOSIS — I499 Cardiac arrhythmia, unspecified: Secondary | ICD-10-CM

## 2018-03-14 DIAGNOSIS — R296 Repeated falls: Secondary | ICD-10-CM | POA: Diagnosis present

## 2018-03-14 DIAGNOSIS — G9341 Metabolic encephalopathy: Secondary | ICD-10-CM | POA: Diagnosis present

## 2018-03-14 LAB — COMPREHENSIVE METABOLIC PANEL
ALBUMIN: 4.1 g/dL (ref 3.5–5.0)
ALT: 14 U/L (ref 14–54)
ANION GAP: 9 (ref 5–15)
AST: 30 U/L (ref 15–41)
Alkaline Phosphatase: 77 U/L (ref 38–126)
BUN: 32 mg/dL — ABNORMAL HIGH (ref 6–20)
CHLORIDE: 109 mmol/L (ref 101–111)
CO2: 21 mmol/L — AB (ref 22–32)
Calcium: 9 mg/dL (ref 8.9–10.3)
Creatinine, Ser: 1.22 mg/dL — ABNORMAL HIGH (ref 0.44–1.00)
GFR calc non Af Amer: 46 mL/min — ABNORMAL LOW (ref >60)
GFR, EST AFRICAN AMERICAN: 54 mL/min — AB (ref >60)
Glucose, Bld: 96 mg/dL (ref 65–99)
Potassium: 3.7 mmol/L (ref 3.5–5.1)
SODIUM: 139 mmol/L (ref 135–145)
Total Bilirubin: 0.8 mg/dL (ref 0.3–1.2)
Total Protein: 7.8 g/dL (ref 6.5–8.1)

## 2018-03-14 LAB — URINE DRUG SCREEN, QUALITATIVE (ARMC ONLY)
Amphetamines, Ur Screen: NOT DETECTED
Barbiturates, Ur Screen: NOT DETECTED
Benzodiazepine, Ur Scrn: NOT DETECTED
CANNABINOID 50 NG, UR ~~LOC~~: NOT DETECTED
COCAINE METABOLITE, UR ~~LOC~~: NOT DETECTED
MDMA (ECSTASY) UR SCREEN: NOT DETECTED
Methadone Scn, Ur: NOT DETECTED
Opiate, Ur Screen: NOT DETECTED
PHENCYCLIDINE (PCP) UR S: NOT DETECTED
Tricyclic, Ur Screen: POSITIVE — AB

## 2018-03-14 LAB — CBC WITH DIFFERENTIAL/PLATELET
BASOS PCT: 1 %
Basophils Absolute: 0.1 10*3/uL (ref 0–0.1)
EOS ABS: 0.4 10*3/uL (ref 0–0.7)
EOS PCT: 5 %
HCT: 30.9 % — ABNORMAL LOW (ref 35.0–47.0)
Hemoglobin: 10.1 g/dL — ABNORMAL LOW (ref 12.0–16.0)
LYMPHS ABS: 1.1 10*3/uL (ref 1.0–3.6)
Lymphocytes Relative: 13 %
MCH: 27.6 pg (ref 26.0–34.0)
MCHC: 32.8 g/dL (ref 32.0–36.0)
MCV: 84.2 fL (ref 80.0–100.0)
Monocytes Absolute: 0.5 10*3/uL (ref 0.2–0.9)
Monocytes Relative: 5 %
Neutro Abs: 6.7 10*3/uL — ABNORMAL HIGH (ref 1.4–6.5)
Neutrophils Relative %: 76 %
PLATELETS: 257 10*3/uL (ref 150–440)
RBC: 3.67 MIL/uL — ABNORMAL LOW (ref 3.80–5.20)
RDW: 18.4 % — ABNORMAL HIGH (ref 11.5–14.5)
WBC: 8.7 10*3/uL (ref 3.6–11.0)

## 2018-03-14 LAB — URINALYSIS, COMPLETE (UACMP) WITH MICROSCOPIC
BILIRUBIN URINE: NEGATIVE
Bacteria, UA: NONE SEEN
GLUCOSE, UA: NEGATIVE mg/dL
HGB URINE DIPSTICK: NEGATIVE
Ketones, ur: NEGATIVE mg/dL
NITRITE: POSITIVE — AB
PH: 5 (ref 5.0–8.0)
Protein, ur: NEGATIVE mg/dL
SPECIFIC GRAVITY, URINE: 1.011 (ref 1.005–1.030)
Squamous Epithelial / LPF: NONE SEEN (ref 0–5)

## 2018-03-14 LAB — TSH: TSH: 0.02 u[IU]/mL — ABNORMAL LOW (ref 0.350–4.500)

## 2018-03-14 LAB — SALICYLATE LEVEL

## 2018-03-14 LAB — TROPONIN I: Troponin I: <0.03 ng/mL (ref <0.03)

## 2018-03-14 LAB — ACETAMINOPHEN LEVEL

## 2018-03-14 LAB — ETHANOL: Alcohol, Ethyl (B): <10 mg/dL (ref <10)

## 2018-03-14 MED ORDER — AMITRIPTYLINE HCL 25 MG PO TABS
75.0000 mg | ORAL_TABLET | Freq: Every day | ORAL | Status: DC
  Administered 2018-03-14 – 2018-03-16 (×2): 75 mg via ORAL
  Filled 2018-03-14: qty 3
  Filled 2018-03-14: qty 2

## 2018-03-14 MED ORDER — LEVOTHYROXINE SODIUM 25 MCG PO TABS
125.0000 ug | ORAL_TABLET | Freq: Every day | ORAL | Status: DC
  Administered 2018-03-15 – 2018-03-16 (×2): 125 ug via ORAL
  Filled 2018-03-14: qty 1
  Filled 2018-03-14: qty 3

## 2018-03-14 MED ORDER — DIPHENHYDRAMINE HCL 50 MG/ML IJ SOLN
50.0000 mg | Freq: Once | INTRAMUSCULAR | Status: AC
  Administered 2018-03-14: 50 mg via INTRAMUSCULAR
  Filled 2018-03-14: qty 1

## 2018-03-14 MED ORDER — HALOPERIDOL LACTATE 5 MG/ML IJ SOLN: 5.0000 mg | Freq: Once | INTRAMUSCULAR | Status: DC

## 2018-03-14 MED ORDER — CEFDINIR 300 MG PO CAPS
300.0000 mg | ORAL_CAPSULE | Freq: Two times a day (BID) | ORAL | Status: DC
  Filled 2018-03-14: qty 1

## 2018-03-14 MED ORDER — ALPRAZOLAM 1 MG PO TABS
1.0000 mg | ORAL_TABLET | Freq: Three times a day (TID) | ORAL | Status: DC
  Administered 2018-03-14 – 2018-03-17 (×7): 1 mg via ORAL
  Filled 2018-03-14: qty 2
  Filled 2018-03-14 (×3): qty 1
  Filled 2018-03-14 (×2): qty 2
  Filled 2018-03-14 (×2): qty 1

## 2018-03-14 MED ORDER — HYDROXYZINE HCL 25 MG PO TABS: 12.5000 mg | ORAL_TABLET | Freq: Three times a day (TID) | ORAL | Status: DC | PRN

## 2018-03-14 MED ORDER — ACETAMINOPHEN 500 MG PO TABS
1000.0000 mg | ORAL_TABLET | Freq: Two times a day (BID) | ORAL | Status: DC
  Administered 2018-03-14 – 2018-03-16 (×3): 1000 mg via ORAL
  Filled 2018-03-14 (×3): qty 2

## 2018-03-14 MED ORDER — CEPHALEXIN 500 MG PO CAPS
500.0000 mg | ORAL_CAPSULE | Freq: Three times a day (TID) | ORAL | Status: DC
  Administered 2018-03-14 – 2018-03-15 (×3): 500 mg via ORAL
  Filled 2018-03-14 (×4): qty 1

## 2018-03-14 MED ORDER — DIPHENHYDRAMINE HCL 25 MG PO CAPS
50.0000 mg | ORAL_CAPSULE | Freq: Once | ORAL | Status: AC
  Administered 2018-03-15: 50 mg via ORAL

## 2018-03-14 MED ORDER — SERTRALINE HCL 50 MG PO TABS
100.0000 mg | ORAL_TABLET | Freq: Every day | ORAL | Status: DC
  Administered 2018-03-15 – 2018-03-17 (×3): 100 mg via ORAL
  Filled 2018-03-14 (×3): qty 2

## 2018-03-14 MED ORDER — DIPHENHYDRAMINE HCL 25 MG PO CAPS
ORAL_CAPSULE | ORAL | Status: AC
  Administered 2018-03-15: 50 mg via ORAL
  Filled 2018-03-14: qty 2

## 2018-03-14 MED ORDER — LORAZEPAM 2 MG/ML IJ SOLN
2.0000 mg | Freq: Once | INTRAMUSCULAR | Status: AC
  Administered 2018-03-14: 2 mg via INTRAMUSCULAR
  Filled 2018-03-14: qty 1

## 2018-03-14 MED ORDER — GABAPENTIN 600 MG PO TABS
600.0000 mg | ORAL_TABLET | Freq: Three times a day (TID) | ORAL | Status: DC
  Administered 2018-03-14 – 2018-03-17 (×8): 600 mg via ORAL
  Filled 2018-03-14 (×8): qty 1

## 2018-03-14 NOTE — ED Triage Notes (Signed)
Pt arrives via [employer], officer Truman Hayward reports that they have had multiple calls regarding pt in the past few days. Pt calling and stating that people are after her, pt appears to be hallucinating and subject to things she is hearing that are not pertaining to her. Pt has been off her medications but recently started them back, pt has bruises noted to her arms bilat and to her rt hip and thigh, pt appears unsteady on her feet, states that there is something in her thigh moving around. Pt combative and beligerent with police at times.  Police report that they think she maybe evicted from her apt, but uncertain, son has been contacted by police regarding pt's situation and mental status, son appears to be uninvolved according to the officer. Pt hears someone in another room talking about something else and immediately starts shouting at them, "it is too you witch!" pt reassured that the person wasn't speaking to her

## 2018-03-14 NOTE — ED Notes (Signed)
Patient asleep in room. No noted distress or abnormal behavior. Will continue 15 minute checks and observation by security cameras for safety. 

## 2018-03-14 NOTE — BHH Counselor (Signed)
Pt asleep and unable to participate in TTS assessment.

## 2018-03-14 NOTE — ED Notes (Signed)
Pt. Currently sleeping in bed. 

## 2018-03-14 NOTE — ED Provider Notes (Signed)
CT scan shows no acute disease. We'll await psychiatric consult.   Nena Polio, MD 03/14/18 [phone]

## 2018-03-14 NOTE — ED Provider Notes (Signed)
[facility] Emergency Department Provider Note  ___________________________________________   First MD Initiated Contact with Patient 03/14/18 1138     (approximate)  I have reviewed the triage vital signs and the nursing notes.   HISTORY  Chief Complaint Altered Mental Status   HPI Diana West is a 62 y.o. female with a history of anxiety as well as a TIA who is presenting with paranoia and agitation.  She called police because she says the people were trying to attack her.  Says the multiple people are trying to break into her home.  IVC reports the voices are telling her to harm herself.  However, the patient does not state any specific plan to me.  Past Medical History:  Diagnosis Date  . Anxiety   . Arthritis   . Cardiac arrhythmia   . Falls frequently   . Palpitation   . Shingles    she states "they are inside and not contagious"  . TIA (transient ischemic attack)     Patient Active Problem List   Diagnosis Date Noted  . Acute UTI 02/13/2018  . Pancreatic cyst 11/12/2017  . Protein-calorie malnutrition, severe 07/23/2017  . Acute cystitis 07/22/2017  . TIA (transient ischemic attack) 07/06/2017  . Hypothyroidism 06/26/2017  . Hypotension 06/26/2017  . Falls frequently 06/26/2017  . Anemia 06/26/2017  . Depression with anxiety 06/26/2017  . FH: premature coronary heart disease 06/26/2017  . Lumbar disc disease 06/26/2017  . Cervical disc disease 06/26/2017  . Chronic headaches 06/26/2017  . History of PSVT (paroxysmal supraventricular tachycardia) 06/26/2017  . Tinea corporis 06/26/2017  . Postherpetic neuralgia 06/26/2017  . CKD (chronic kidney disease) stage 3, GFR 30-59 ml/min (HCC) 06/26/2017    Past Surgical History:  Procedure Laterality Date  . ABDOMINAL HYSTERECTOMY    . CESAREAN SECTION    . CHOLECYSTECTOMY      Prior to Admission medications   Medication Sig Start Date End Date Taking? Authorizing Provider    acetaminophen (TYLENOL) 500 MG tablet Take 2 tablets (1,000 mg total) by mouth 2 (two) times daily. 06/27/17   Plonk, Gwyndolyn Saxon, MD  ALPRAZolam Duanne Moron) 1 MG tablet Take 1 mg by mouth 3 (three) times daily.  04/06/17   [provider]  amitriptyline (ELAVIL) 150 MG tablet Take 0.5 tablets (75 mg total) by mouth at bedtime. 02/14/18   Salary, Holly Bodily D, MD  aspirin 81 MG tablet Take 1 tablet (81 mg total) by mouth daily. 07/06/17   Plonk, Gwyndolyn Saxon, MD  cefdinir (OMNICEF) 300 MG capsule Take 1 capsule (300 mg total) by mouth 2 (two) times daily. 02/14/18   Salary, Avel Peace, MD  Cholecalciferol (VITAMIN D-3) 1000 units CAPS Take 1 capsule (1,000 Units total) by mouth daily. 06/27/17   Plonk, Gwyndolyn Saxon, MD  gabapentin (NEURONTIN) 600 MG tablet Take 1 tablet (600 mg total) by mouth 3 (three) times daily. 06/26/17   Plonk, Gwyndolyn Saxon, MD  hydrOXYzine (ATARAX/VISTARIL) 25 MG tablet Take 0.5 tablets (12.5 mg total) 3 (three) times daily as needed by mouth for itching. Patient not taking: Reported on 02/12/2018 08/25/17   Marylene Land, NP  levothyroxine (SYNTHROID, LEVOTHROID) 100 MCG tablet Take 1 tablet (100 mcg total) by mouth daily before breakfast. Patient taking differently: Take 125 mcg by mouth daily before breakfast.  09/11/17   Plonk, Gwyndolyn Saxon, MD  meloxicam (MOBIC) 7.5 MG tablet Take 1 tablet (7.5 mg total) by mouth daily. 02/14/18 02/14/19  Salary, Holly Bodily D, MD  Multiple Vitamin (MULTIVITAMIN) capsule Take 1 capsule  by mouth daily.    [provider]  sertraline (ZOLOFT) 100 MG tablet Take 1 tablet by mouth daily. 05/01/17   [provider]    Allergies Codeine and Toradol [ketorolac tromethamine]  Family History  Problem Relation Age of Onset  . CAD Mother   . CAD Father     Social History Social History   Tobacco Use  . Smoking status: Current Some Day Smoker    Packs/day: 0.25    Types: Cigarettes  . Smokeless tobacco: Never Used  Substance Use Topics  . Alcohol use:  No  . Drug use: No    Review of Systems  Constitutional: No fever/chills Eyes: No visual changes. ENT: No sore throat. Cardiovascular: Denies chest pain. Respiratory: Denies shortness of breath. Gastrointestinal: No abdominal pain.  No nausea, no vomiting.  No diarrhea.  No constipation. Genitourinary: Negative for dysuria. Musculoskeletal: Negative for back pain. Skin: Negative for rash. Neurological: Negative for headaches, focal weakness or numbness.   ____________________________________________   PHYSICAL EXAM:  VITAL SIGNS: ED Triage Vitals  Enc Vitals Group     BP 03/14/18 1135 94/69     Pulse Rate 03/14/18 1135 (!) 58     Resp --      Temp 03/14/18 1135 98.9 F (37.2 C)     Temp Source 03/14/18 1135 Oral     SpO2 03/14/18 1135 96 %     Weight 03/14/18 1155 105 lb (47.6 kg)     Height 03/14/18 1155 5\' 6"  (1.676 m)     Head Circumference --      Peak Flow --      Pain Score --      Pain Loc --      Pain Edu? --      Excl. in [place]? --     Constitutional: Alert and oriented.  Tangential.  Agitated. Eyes: Conjunctivae are normal.  Head: Atraumatic. Nose: No congestion/rhinnorhea. Mouth/Throat: Mucous membranes are moist.  Neck: No stridor.   Cardiovascular: Normal rate, regular rhythm. Grossly normal heart sounds.   Respiratory: Normal respiratory effort.  No retractions. Lungs CTAB. Gastrointestinal: Soft and nontender. No distention.  Musculoskeletal: No lower extremity tenderness nor edema.  No joint effusions. Neurologic:  Normal speech and language. No gross focal neurologic deficits are appreciated. Skin:  Skin is warm, dry and intact. No rash noted. Psychiatric: Agitated.  Tangential.  Appears to be having conversations with people who are not there.  ____________________________________________   LABS (all labs ordered are listed, but only abnormal results are displayed)  Labs Reviewed  CBC WITH DIFFERENTIAL/PLATELET - Abnormal; Notable for the  following components:      Result Value   RBC 3.67 (*)    Hemoglobin 10.1 (*)    HCT 30.9 (*)    RDW 18.4 (*)    Neutro Abs 6.7 (*)    All other components within normal limits  ETHANOL  COMPREHENSIVE METABOLIC PANEL  ACETAMINOPHEN LEVEL  SALICYLATE LEVEL  URINALYSIS, COMPLETE (UACMP) WITH MICROSCOPIC  TROPONIN I  URINE DRUG SCREEN, QUALITATIVE ([facility] ONLY)  TSH   ____________________________________________  EKG  ED ECG REPORT I, Doran Stabler, the attending physician, personally viewed and interpreted this ECG.   Date: 03/14/2018  EKG Time: 1417  Rate: 92  Rhythm: sinus tachycardia with intermittent bigeminy  Axis: Normal  Intervals:none  ST&T Change: No ST segment elevation or depression.  No abnormal T wave inversion.  ____________________________________________  RADIOLOGY  Pending head CT ____________________________________________   PROCEDURES  Procedure(s) performed:   Procedures  Critical Care performed:   ____________________________________________   INITIAL IMPRESSION / ASSESSMENT AND PLAN / ED COURSE  Pertinent labs & imaging results that were available during my care of the patient were reviewed by me and considered in my medical decision making (see chart for details).  Differential diagnosis includes, but is not limited to, alcohol, illicit or prescription medications, or other toxic ingestion; intracranial pathology West as stroke or intracerebral hemorrhage; fever or infectious causes including sepsis; hypoxemia and/or hypercarbia; uremia; trauma; endocrine related disorders West as diabetes, hypoglycemia, and thyroid-related diseases; hypertensive encephalopathy; etc. As part of my medical decision making, I reviewed the following data within the electronic MEDICAL RECORD NUMBER Notes from prior ED visits  ----------------------------------------- 3:45 PM on 03/14/2018 -----------------------------------------  Patient requiring  chemical sedation and is now sedated.  Has a history of agitation as can be seen from previous care intake notes.  Found to have UTI will be treated for UTI.  Presentation characteristics suggest organic psychiatric cause.  Patient IVc.  Pending CAT scan results at this time. ____________________________________________   FINAL CLINICAL IMPRESSION(S) / ED DIAGNOSES  UTI.  Psychosis.    NEW MEDICATIONS STARTED DURING THIS VISIT:  New Prescriptions   No medications on file     Note:  This document was prepared using Dragon voice recognition software and may include unintentional dictation errors.     Orbie Pyo, MD 03/14/18 [phone]

## 2018-03-14 NOTE — ED Notes (Signed)
Pt speaking loudly in room by herself. Pt overheard having an argument with another person (no one else present in the room).  Pt believes she has been fed chemicals by the same people breaking into her house. Pt unhappy with blood draw and IM injections. Maintained on 15 minute checks and observation by security camera for safety.

## 2018-03-14 NOTE — ED Notes (Addendum)
[name] called after receiving word that his mother was here under IVC.  He left his name and contact number  Gabriel Rung [phone]).

## 2018-03-14 NOTE — BH Assessment (Signed)
Writer calked to speak with [name] Lekisha Mcghee [phone]. Writer learned that pt has been exhibiting signs of paranoia and auditory hallucinations on and off for the past 6 months. Mr. Walgren reports symptoms have increased more over the past month. Pt has called law enforcement multiple times reporting false information (I.e. Paranoia), has been observed punching windows at her apartment complex, and frequently responds to internal stimuli as evidenced by her talking (and arguing) with people who are not present. Pt's current lease will not be renewed due to behaviors. Mr. Bourne suspects pt may not be taking medications as prescribed as pt self administers. Pt has hx of being combative w/ son who has tried to assist with monitoring her medications in the past.

## 2018-03-15 ENCOUNTER — Inpatient Hospital Stay: Admission: AD | Admit: 2018-03-15 | Payer: Medicare Other | Admitting: Psychiatry

## 2018-03-15 LAB — BASIC METABOLIC PANEL
Anion gap: 8 (ref 5–15)
BUN: 27 mg/dL — AB (ref 6–20)
CO2: 22 mmol/L (ref 22–32)
Calcium: 8.9 mg/dL (ref 8.9–10.3)
Chloride: 112 mmol/L — ABNORMAL HIGH (ref 101–111)
Creatinine, Ser: 0.84 mg/dL (ref 0.44–1.00)
GFR calc Af Amer: >60 mL/min (ref >60)
GLUCOSE: 99 mg/dL (ref 65–99)
POTASSIUM: 3.6 mmol/L (ref 3.5–5.1)
Sodium: 142 mmol/L (ref 135–145)

## 2018-03-15 LAB — MAGNESIUM: Magnesium: 1.8 mg/dL (ref 1.7–2.4)

## 2018-03-15 MED ORDER — LORAZEPAM 1 MG PO TABS
1.0000 mg | ORAL_TABLET | Freq: Once | ORAL | Status: AC
  Administered 2018-03-15: 1 mg via ORAL
  Filled 2018-03-15: qty 1

## 2018-03-15 MED ORDER — ZIPRASIDONE MESYLATE 20 MG IM SOLR
INTRAMUSCULAR | Status: AC
  Filled 2018-03-15: qty 20

## 2018-03-15 MED ORDER — HALOPERIDOL 1 MG PO TABS
0.5000 mg | ORAL_TABLET | Freq: Three times a day (TID) | ORAL | Status: DC
  Administered 2018-03-15 – 2018-03-17 (×5): 0.5 mg via ORAL
  Filled 2018-03-15: qty 0.5
  Filled 2018-03-15 (×3): qty 1
  Filled 2018-03-15 (×4): qty 0.5
  Filled 2018-03-15 (×2): qty 1

## 2018-03-15 MED ORDER — ZIPRASIDONE MESYLATE 20 MG IM SOLR
10.0000 mg | Freq: Once | INTRAMUSCULAR | Status: AC
  Administered 2018-03-15: 10 mg via INTRAMUSCULAR
  Filled 2018-03-15: qty 20

## 2018-03-15 MED ORDER — LORAZEPAM 2 MG/ML IJ SOLN
2.0000 mg | Freq: Once | INTRAMUSCULAR | Status: AC
  Administered 2018-03-15: 2 mg via INTRAMUSCULAR
  Filled 2018-03-15: qty 1

## 2018-03-15 MED ORDER — IBUPROFEN 400 MG PO TABS
400.0000 mg | ORAL_TABLET | Freq: Once | ORAL | Status: AC
  Administered 2018-03-15: 400 mg via ORAL
  Filled 2018-03-15: qty 1

## 2018-03-15 MED ORDER — ZIPRASIDONE MESYLATE 20 MG IM SOLR
10.0000 mg | Freq: Once | INTRAMUSCULAR | Status: AC
  Administered 2018-03-15: 10 mg via INTRAMUSCULAR

## 2018-03-15 NOTE — ED Notes (Signed)
Unable to complete SOC at this time. Pt asleep.

## 2018-03-15 NOTE — BH Assessment (Signed)
Writer seen by [facility] and recommends inpatient treatment. Discussed with [facility] Rounding Physician, Dr. He, and she will put in the admission orders.  Patient is to be admitted to [facility] BMU by Dr. Dr. He.  Attending Physician will be Dr. Wonda Olds.   Patient has been assigned to room 316, by [place] Nurse Mechele Claude.   ER staff is aware of the admission:  Olivia Mackie, ER Dian Situ   Dr. Dineen Kid, ER MD   Yong Channel, Patient's Nurse   Levada Dy, Patient Access.

## 2018-03-15 NOTE — ED Notes (Addendum)
Pt with palpable heart rate of 80, matching EKG rhythm. Pox probe is reading hr in 40s and 50s. Pt remains too sleepy to take po meds. Will provide meds when pt more awake.

## 2018-03-15 NOTE — ED Notes (Signed)
Pt very agitated, attempting to leave repeatedly. Sitter remains at bedside. md aware.

## 2018-03-15 NOTE — ED Notes (Signed)
Pt fell while getting out of bed, unwitnessed. K[place] MD notified. Pt helped back into bed. Post fall VS taken. Pending MD orders.

## 2018-03-15 NOTE — ED Notes (Signed)
ED BHU [place] Is the patient under IVC or is there intent for IVC: Yes.   Is the patient medically cleared: Yes.   Is there vacancy in the ED BHU: Yes.   Is the population mix appropriate for patient: Yes.   Is the patient awaiting placement in inpatient or outpatient setting: Yes.   Has the patient had a psychiatric consult: Yes.   Survey of unit performed for contraband, proper placement and condition of furniture, tampering with fixtures in bathroom, shower, and each patient room: Yes.   APPEARANCE/BEHAVIOR adequate rapport can be established NEURO ASSESSMENT Orientation: place and person Hallucinations: Yes.  Auditory Hallucinations Speech: Garbled Gait: unsteady RESPIRATORY ASSESSMENT Normal expansion.  Clear to auscultation.  No rales, rhonchi, or wheezing. CARDIOVASCULAR ASSESSMENT regular rate and rhythm, S1, S2 normal, no murmur, click, rub or gallop GASTROINTESTINAL ASSESSMENT soft, nontender, BS WNL, no r/g EXTREMITIES normal strength, tone, and muscle mass PLAN OF CARE Provide calm/safe environment. Vital signs assessed twice daily. ED BHU Assessment once each 12-hour shift. Collaborate with intake RN daily or as condition indicates. Assure the ED provider has rounded once each shift. Provide and encourage hygiene. Provide redirection as needed. Assess for escalating behavior; address immediately and inform ED provider.  Assess family dynamic and appropriateness for visitation as needed: Yes.   Educate the patient/family about BHU procedures/visitation: Yes.

## 2018-03-15 NOTE — BH Assessment (Signed)
Assessment Note  Diana West is an 62 y.o. female brought into ED via [employer]. For the past two days, pt has repeatedly called law enforcement reporting false information. Pt is exhibiting intense paranoia and feels "people are after her". Pt experiencing auditory hallucinations and observed in ED arguing with individuals who are not present. Pt's altered mental status prevented her from focusing and having organized speech during assessment. When asked what occurred prior to her arriving to ED, pt verbalized information regarding a "boyfriend", a party that she wants to attend, and being lied to often. Pt unable to provide clear reason. Writer called pt's son Aicia Babinski) to collect collateral information. [name] reports that his mother began acting bizarre 6 months ago, however the hallucinations and paranoia have increased over the past month. Son reports that he was administering his mother's medications, but she recently became combative and refused his help. Son suspects mom is misusing medications/ skipping doses.   Pt's behaviors have caused conflicts within her apartment complex, preventing her from being able to renew lease in August. Pt has been found knocking on neighbor's windows.  Pt denies SI and HI. Pt has been observed endorsing both AH and VH.  Diagnosis: Psychosis  Past Medical History:  Past Medical History:  Diagnosis Date  . Anxiety   . Arthritis   . Cardiac arrhythmia   . Falls frequently   . Palpitation   . Shingles    she states "they are inside and not contagious"  . TIA (transient ischemic attack)     Past Surgical History:  Procedure Laterality Date  . ABDOMINAL HYSTERECTOMY    . CESAREAN SECTION    . CHOLECYSTECTOMY      Family History:  Family History  Problem Relation Age of Onset  . CAD Mother   . CAD Father     Social History:  reports that she has been smoking cigarettes.  She has been smoking about 0.25 packs per day. She has never used  smokeless tobacco. She reports that she does not drink alcohol or use drugs.  Additional Social History:  Alcohol / Drug Use Pain Medications: see PTA Prescriptions: see PTA Over the Counter: see PTA History of alcohol / drug use?: No history of alcohol / drug abuse  CIWA: CIWA-Ar BP: 94/69 Pulse Rate: (!) 58 COWS:    Allergies:  Allergies  Allergen Reactions  . Codeine Itching and Rash  . Toradol [Ketorolac Tromethamine] Rash    Home Medications:  (Not in a hospital admission)  OB/GYN Status:  No LMP recorded. Patient has had a hysterectomy.  General Assessment Data Location of Assessment: [facility] ED TTS Assessment: In system Is this a Tele or Face-to-Face Assessment?: Face-to-Face Is this an Initial Assessment or a Re-assessment for this encounter?: Initial Assessment Marital status: Divorced Is patient pregnant?: No Pregnancy Status: No Living Arrangements: Alone Can pt return to current living arrangement?: Yes Admission Status: Involuntary Is patient capable of signing voluntary admission?: No Referral Source: Other(Neighbors possibly called police, pt altered mental status) Insurance type: Medicare  Medical Screening Exam ([place]) Medical Exam completed: Yes  Crisis Care Plan Living Arrangements: Alone Legal Guardian: Other:(self) Name of Psychiatrist: None Name of Therapist: None  Education Status Is patient currently in school?: No Is the patient employed, unemployed or receiving disability?: Receiving disability income  Risk to self with the past 6 months Suicidal Ideation: No Has patient been a risk to self within the past 6 months prior to admission? : Other (  comment)(Pt responds to internal stimuli, severe paranoia) Suicidal Intent: No Has patient had any suicidal intent within the past 6 months prior to admission? : No Is patient at risk for suicide?: No, but patient needs Medical Clearance Suicidal Plan?: No Has patient had any suicidal  plan within the past 6 months prior to admission? : No Access to Means: Yes(Possibly knives in home, pt adminsters own meds) Specify Access to Suicidal Means: (Possibly knives in home, pt adminsters own meds) What has been your use of drugs/alcohol within the last 12 months?: none Previous Attempts/Gestures: Yes How many times?: 1 Other Self Harm Risks: none Triggers for Past Attempts: Hallucinations, Unpredictable Intentional Self Injurious Behavior: None Family Suicide History: No Recent stressful life event(s): Other (Comment)(Increase paranoia, psychosis) Persecutory voices/beliefs?: Yes Depression: Yes Depression Symptoms: Despondent, Tearfulness, Feeling worthless/self pity, Feeling angry/irritable Substance abuse history and/or treatment for substance abuse?: No Suicide prevention information given to non-admitted patients: Not applicable  Risk to Others within the past 6 months Homicidal Ideation: No Does patient have any lifetime risk of violence toward others beyond the six months prior to admission? : No Thoughts of Harm to Others: No Current Homicidal Intent: No Current Homicidal Plan: No Access to Homicidal Means: No Identified Victim: None identified History of harm to others?: No Assessment of Violence: None Noted Violent Behavior Description: (None noted) Does patient have access to weapons?: (Pt denies, pt exhibiting psychosis) Criminal Charges Pending?: No Does patient have a court date: No Is patient on probation?: No  Psychosis Hallucinations: Auditory Delusions: Persecutory  Mental Status Report Appearance/Hygiene: Disheveled, In scrubs Eye Contact: Fair Motor Activity: Freedom of movement Speech: Rapid, Word salad Level of Consciousness: Alert, Crying Mood: Anxious, Helpless, Preoccupied, Sad Affect: Anxious, Preoccupied, Sad Anxiety Level: Moderate Thought Processes: Irrelevant, Flight of Ideas Judgement: Partial Orientation: Person, Place, Time,  Situation Obsessive Compulsive Thoughts/Behaviors: Moderate  Cognitive Functioning Concentration: Poor Memory: Remote Intact, Recent Impaired Is patient IDD: No Is patient DD?: Unknown Insight: Poor Impulse Control: Poor Appetite: Good Have you had any weight changes? : No Change Sleep: Decreased Total Hours of Sleep: 6 Vegetative Symptoms: None  ADLScreening [facility] Assessment Services) Patient's cognitive ability adequate to safely complete daily activities?: Yes Patient able to express need for assistance with ADLs?: Yes Independently performs ADLs?: Yes (appropriate for developmental age)  Prior Inpatient Therapy Prior Inpatient Therapy: No  Prior Outpatient Therapy Prior Outpatient Therapy: No Does patient have an ACCT team?: No Does patient have Intensive In-House Services?  : No Does patient have [facility] services? : No Does patient have P4CC services?: No  ADL Screening (condition at time of admission) Patient's cognitive ability adequate to safely complete daily activities?: Yes Is the patient deaf or have difficulty hearing?: No Does the patient have difficulty seeing, even when wearing glasses/contacts?: No Does the patient have difficulty concentrating, remembering, or making decisions?: Yes Patient able to express need for assistance with ADLs?: Yes Does the patient have difficulty dressing or bathing?: No Independently performs ADLs?: Yes (appropriate for developmental age) Does the patient have difficulty walking or climbing stairs?: Yes Weakness of Legs: None Weakness of Arms/Hands: None  Home Assistive Devices/Equipment Home Assistive Devices/Equipment: None  Therapy Consults (therapy consults require a physician order) PT Evaluation Needed: No OT Evalulation Needed: No SLP Evaluation Needed: No Abuse/Neglect Assessment (Assessment to be complete while patient is alone) Abuse/Neglect Assessment Can Be Completed: Unable to assess, patient is  non-responsive or altered mental status Values / Beliefs Cultural Requests During Hospitalization: None Spiritual  Requests During Hospitalization: None Consults Spiritual Care Consult Needed: No Social Work Consult Needed: No      Additional Information 1:1 In Past 12 Months?: No CIRT Risk: No Elopement Risk: No Does patient have medical clearance?: Yes     Disposition:  Disposition Initial Assessment Completed for this Encounter: Yes Disposition of Patient: Admit Type of inpatient treatment program: Adult Patient refused recommended treatment: No Mode of transportation if patient is discharged?: Other Patient referred to: Other (Comment)(Pending SOC recommendation)  On Site Evaluation by:   Reviewed with Physician:    Elie Goody, [facility] 03/15/2018 5:46 AM

## 2018-03-15 NOTE — ED Notes (Signed)
Returned to quad after retrieving a beverage for another quad pt to find pt Diana West wandering the hallway trying to enter other pt's rooms.  I directed the pt back to her room when she batted my hand away stating "keep your hands off of me" then fell forwards hitting her left shoulder on the foot of the bed.  There was a second *thud* as the pt hit the floor.  I did not see what made the second contact.  Pt has no sign of head trauma

## 2018-03-15 NOTE — ED Notes (Addendum)
Pt too sleepy to take po medications at this time. md notified of pt's hr of 43 and blood pressure 92/42 with sleepiness. md states to hold elavil, neurontin, haldol, valium  At this time due to sedation. resps unlabored.

## 2018-03-15 NOTE — ED Notes (Signed)
Pt being uncooperative and anxious. Very paranoid of medications being administered. States "your trying to kill me". Sitter at bedside. Brandy RN notified.

## 2018-03-15 NOTE — ED Notes (Signed)
Report from hunter, rn. Pt with sitter, scott at bedside. Pt awake, talking in bed.

## 2018-03-15 NOTE — ED Notes (Signed)
Dave Medic sitting at bedside.

## 2018-03-15 NOTE — ED Provider Notes (Signed)
Patient had unwitnessed fall in her room.  I examined the patient, no evidence of bony injury, no evidence of head trauma.  Able to get to her feet with little assistance.  Abdomen soft, bilateral breath sounds.  Old bruises to the right hip, normal range of motion, no pain with axial load on both hips.  She is quite agitated and yelling at staff.  She will require chemical sedation for her safety.   Lavonia Drafts, MD 03/15/18 [phone]

## 2018-03-15 NOTE — ED Provider Notes (Signed)
-----------------------------------------   4:45 AM on 03/15/2018 -----------------------------------------   Blood pressure 94/69, pulse (!) 58, temperature 98.9 F (37.2 C), temperature source Oral, height 5\' 6"  (1.676 m), weight 47.6 kg (105 lb), SpO2 96 %.  The patient had no acute events since last update.  Calm and cooperative at this time.  Pending psychiatric placement/admission     Harvest Dark, MD 03/15/18 [phone]

## 2018-03-15 NOTE — ED Notes (Signed)
Pt hallucinating. Talking to wall in room. Reports "its the [employer]". Pt getting agitated. Verbal order received by Clearnce Hasten MD.

## 2018-03-15 NOTE — ED Notes (Signed)
Pt sleeping. 

## 2018-03-15 NOTE — ED Notes (Signed)
Pt eating breakfast tray. Sitter at bedside.

## 2018-03-15 NOTE — ED Notes (Signed)
Pt awake. Calm and cooperative at this time. [place] computer at bedside.

## 2018-03-16 ENCOUNTER — Other Ambulatory Visit: Payer: Self-pay

## 2018-03-16 ENCOUNTER — Encounter: Payer: Self-pay | Admitting: Internal Medicine

## 2018-03-16 DIAGNOSIS — Z7982 Long term (current) use of aspirin: Secondary | ICD-10-CM | POA: Diagnosis not present

## 2018-03-16 DIAGNOSIS — R001 Bradycardia, unspecified: Secondary | ICD-10-CM | POA: Diagnosis present

## 2018-03-16 DIAGNOSIS — Z79899 Other long term (current) drug therapy: Secondary | ICD-10-CM | POA: Diagnosis not present

## 2018-03-16 DIAGNOSIS — M1990 Unspecified osteoarthritis, unspecified site: Secondary | ICD-10-CM | POA: Diagnosis present

## 2018-03-16 DIAGNOSIS — Z79890 Hormone replacement therapy (postmenopausal): Secondary | ICD-10-CM | POA: Diagnosis not present

## 2018-03-16 DIAGNOSIS — B9620 Unspecified Escherichia coli [E. coli] as the cause of diseases classified elsewhere: Secondary | ICD-10-CM | POA: Diagnosis present

## 2018-03-16 DIAGNOSIS — Y92230 Patient room in hospital as the place of occurrence of the external cause: Secondary | ICD-10-CM | POA: Diagnosis not present

## 2018-03-16 DIAGNOSIS — Z885 Allergy status to narcotic agent status: Secondary | ICD-10-CM | POA: Diagnosis not present

## 2018-03-16 DIAGNOSIS — I493 Ventricular premature depolarization: Secondary | ICD-10-CM

## 2018-03-16 DIAGNOSIS — G9341 Metabolic encephalopathy: Secondary | ICD-10-CM | POA: Diagnosis present

## 2018-03-16 DIAGNOSIS — R008 Other abnormalities of heart beat: Secondary | ICD-10-CM | POA: Diagnosis present

## 2018-03-16 DIAGNOSIS — Z888 Allergy status to other drugs, medicaments and biological substances status: Secondary | ICD-10-CM | POA: Diagnosis not present

## 2018-03-16 DIAGNOSIS — F17210 Nicotine dependence, cigarettes, uncomplicated: Secondary | ICD-10-CM | POA: Diagnosis present

## 2018-03-16 DIAGNOSIS — R946 Abnormal results of thyroid function studies: Secondary | ICD-10-CM | POA: Diagnosis present

## 2018-03-16 DIAGNOSIS — R296 Repeated falls: Secondary | ICD-10-CM | POA: Diagnosis present

## 2018-03-16 DIAGNOSIS — L8990 Pressure ulcer of unspecified site, unspecified stage: Secondary | ICD-10-CM

## 2018-03-16 DIAGNOSIS — E876 Hypokalemia: Secondary | ICD-10-CM | POA: Diagnosis present

## 2018-03-16 DIAGNOSIS — F23 Brief psychotic disorder: Secondary | ICD-10-CM | POA: Diagnosis present

## 2018-03-16 DIAGNOSIS — F419 Anxiety disorder, unspecified: Secondary | ICD-10-CM | POA: Diagnosis not present

## 2018-03-16 DIAGNOSIS — W19XXXA Unspecified fall, initial encounter: Secondary | ICD-10-CM | POA: Diagnosis not present

## 2018-03-16 DIAGNOSIS — E440 Moderate protein-calorie malnutrition: Secondary | ICD-10-CM | POA: Diagnosis present

## 2018-03-16 DIAGNOSIS — L89893 Pressure ulcer of other site, stage 3: Secondary | ICD-10-CM | POA: Diagnosis present

## 2018-03-16 DIAGNOSIS — Z681 Body mass index (BMI) 19 or less, adult: Secondary | ICD-10-CM | POA: Diagnosis not present

## 2018-03-16 DIAGNOSIS — F331 Major depressive disorder, recurrent, moderate: Secondary | ICD-10-CM | POA: Diagnosis not present

## 2018-03-16 DIAGNOSIS — N390 Urinary tract infection, site not specified: Secondary | ICD-10-CM | POA: Diagnosis present

## 2018-03-16 DIAGNOSIS — E860 Dehydration: Secondary | ICD-10-CM | POA: Diagnosis present

## 2018-03-16 LAB — T4, FREE: Free T4: 1.42 ng/dL (ref 0.82–1.77)

## 2018-03-16 MED ORDER — POTASSIUM CHLORIDE CRYS ER 20 MEQ PO TBCR
40.0000 meq | EXTENDED_RELEASE_TABLET | Freq: Two times a day (BID) | ORAL | Status: AC
  Administered 2018-03-16 (×2): 40 meq via ORAL
  Filled 2018-03-16 (×2): qty 2

## 2018-03-16 MED ORDER — ADULT MULTIVITAMIN W/MINERALS CH
1.0000 | ORAL_TABLET | Freq: Every day | ORAL | Status: DC
  Administered 2018-03-16 – 2018-03-17 (×2): 1 via ORAL
  Filled 2018-03-16 (×2): qty 1

## 2018-03-16 MED ORDER — ENSURE ENLIVE PO LIQD
237.0000 mL | Freq: Two times a day (BID) | ORAL | Status: DC
  Administered 2018-03-16 – 2018-03-17 (×3): 237 mL via ORAL

## 2018-03-16 MED ORDER — NICOTINE 21 MG/24HR TD PT24
21.0000 mg | MEDICATED_PATCH | Freq: Every day | TRANSDERMAL | Status: DC
  Administered 2018-03-16 – 2018-03-17 (×2): 21 mg via TRANSDERMAL
  Filled 2018-03-16 (×2): qty 1

## 2018-03-16 MED ORDER — MAGNESIUM SULFATE 2 GM/50ML IV SOLN
2.0000 g | Freq: Once | INTRAVENOUS | Status: AC
  Administered 2018-03-16: 2 g via INTRAVENOUS
  Filled 2018-03-16: qty 50

## 2018-03-16 MED ORDER — LACTATED RINGERS IV SOLN
INTRAVENOUS | Status: AC
  Administered 2018-03-16: 06:00:00 via INTRAVENOUS

## 2018-03-16 MED ORDER — ACETAMINOPHEN 650 MG RE SUPP: 650.0000 mg | Freq: Four times a day (QID) | RECTAL | Status: DC | PRN

## 2018-03-16 MED ORDER — LACTATED RINGERS IV SOLN: INTRAVENOUS | Status: DC

## 2018-03-16 MED ORDER — OXYCODONE-ACETAMINOPHEN 5-325 MG PO TABS
1.0000 | ORAL_TABLET | Freq: Four times a day (QID) | ORAL | Status: DC | PRN
  Administered 2018-03-16 – 2018-03-17 (×4): 1 via ORAL
  Filled 2018-03-16 (×4): qty 1

## 2018-03-16 MED ORDER — BISACODYL 5 MG PO TBEC: 5.0000 mg | DELAYED_RELEASE_TABLET | Freq: Every day | ORAL | Status: DC | PRN

## 2018-03-16 MED ORDER — ASPIRIN EC 81 MG PO TBEC
81.0000 mg | DELAYED_RELEASE_TABLET | Freq: Every day | ORAL | Status: DC
  Administered 2018-03-16 – 2018-03-17 (×2): 81 mg via ORAL
  Filled 2018-03-16 (×2): qty 1

## 2018-03-16 MED ORDER — SENNOSIDES-DOCUSATE SODIUM 8.6-50 MG PO TABS: 1.0000 | ORAL_TABLET | Freq: Every evening | ORAL | Status: DC | PRN

## 2018-03-16 MED ORDER — SODIUM CHLORIDE 0.9 % IV SOLN
1.0000 g | INTRAVENOUS | Status: DC
  Administered 2018-03-16 – 2018-03-17 (×2): 1 g via INTRAVENOUS
  Filled 2018-03-16 (×2): qty 1
  Filled 2018-03-16: qty 10

## 2018-03-16 MED ORDER — ONDANSETRON HCL 4 MG/2ML IJ SOLN: 4.0000 mg | Freq: Four times a day (QID) | INTRAMUSCULAR | Status: DC | PRN

## 2018-03-16 MED ORDER — ONDANSETRON HCL 4 MG PO TABS: 4.0000 mg | ORAL_TABLET | Freq: Four times a day (QID) | ORAL | Status: DC | PRN

## 2018-03-16 MED ORDER — ACETAMINOPHEN 325 MG PO TABS
650.0000 mg | ORAL_TABLET | Freq: Four times a day (QID) | ORAL | Status: DC | PRN
  Administered 2018-03-16 – 2018-03-17 (×2): 650 mg via ORAL
  Filled 2018-03-16 (×2): qty 2

## 2018-03-16 MED ORDER — ENOXAPARIN SODIUM 40 MG/0.4ML ~~LOC~~ SOLN
40.0000 mg | SUBCUTANEOUS | Status: DC
  Administered 2018-03-16 – 2018-03-17 (×2): 40 mg via SUBCUTANEOUS
  Filled 2018-03-16 (×2): qty 0.4

## 2018-03-16 NOTE — ED Notes (Signed)
Notified by aide of pt's vital signs.

## 2018-03-16 NOTE — ED Provider Notes (Signed)
-----------------------------------------   3:47 AM on 03/16/2018 -----------------------------------------  Noted by nurse of patient's persistent bradycardia.  Rhythm strip done with noted bigeminy followed by normal sinus rhythm with occasional PVCs.  Review of records demonstrate patient was admitted for altered mental status on 02/13/2018; she had a UTI at that time.  She is sedated and unable to answer my questions.  The difference in the monitoring strips is quite remarkable.  Discussed with hospitalist to evaluate patient in the emergency department for admission for bigeminy, altered mentation and UTI.   Paulette Blanch, MD 03/16/18 [phone]

## 2018-03-16 NOTE — ED Notes (Addendum)
Pt's purse and belonging bag is in [employer].

## 2018-03-16 NOTE — ED Notes (Signed)
Dr. Clearnce Hasten notified of pt's hr 34, blood pressure soft. No new orders received.

## 2018-03-16 NOTE — ED Notes (Signed)
md notified of rhythm strips and hr down to 36-38. Pt continues to sleep, but arouses to ekg leads placed on chest. Order for continous 3 page rhythm strip obtained.

## 2018-03-16 NOTE — H&P (Signed)
[name] [place] NAME: Diana West    MR#:  [id number]  DATE OF BIRTH:  [birth year]  DATE OF ADMISSION:  03/14/2018  PRIMARY CARE PHYSICIAN: Patient, No Pcp Per   REQUESTING/REFERRING PHYSICIAN: Paulette Blanch, MD  CHIEF COMPLAINT:   Chief Complaint  Patient presents with  . Altered Mental Status    HISTORY OF PRESENT ILLNESS:  Diana West  is a 62 y.o. female with a known history of anxiety, TIA who p/w psychosis. Received [place], is very lethargic/somnolent, AAOx3 per nursing, arousable but falls asleep quickly, not able to provide history. Also unable to obtain complete ROS. Denies CP/SOB. Bradycardia, HR as low as 30s in ED, EKG w/ bigeminy. K+/Mag levels WNL. U/A (+) UTI.  PAST MEDICAL HISTORY:   Past Medical History:  Diagnosis Date  . Anxiety   . Arthritis   . Cardiac arrhythmia   . Falls frequently   . Palpitation   . Shingles    she states "they are inside and not contagious"  . TIA (transient ischemic attack)     PAST SURGICAL HISTORY:   Past Surgical History:  Procedure Laterality Date  . ABDOMINAL HYSTERECTOMY    . CESAREAN SECTION    . CHOLECYSTECTOMY      SOCIAL HISTORY:   Social History   Tobacco Use  . Smoking status: Current Some Day Smoker    Packs/day: 0.25    Types: Cigarettes  . Smokeless tobacco: Never Used  Substance Use Topics  . Alcohol use: No    FAMILY HISTORY:   Family History  Problem Relation Age of Onset  . CAD Mother   . CAD Father     DRUG ALLERGIES:   Allergies  Allergen Reactions  . Codeine Itching and Rash  . Toradol [Ketorolac Tromethamine] Rash    REVIEW OF SYSTEMS:   Review of Systems  Unable to perform ROS: Psychiatric disorder  Respiratory: Negative for shortness of breath.   Cardiovascular: Negative for chest pain.   MEDICATIONS AT HOME:   Prior to Admission medications   Not on File      VITAL SIGNS:  Blood pressure (!) 100/42, pulse  (!) 101, temperature 98.3 F (36.8 C), temperature source Oral, resp. rate 12, height 5\' 6"  (1.676 m), weight 48.9 kg (107 lb 11.2 oz), SpO2 95 %.  PHYSICAL EXAMINATION:  Physical Exam  Constitutional: She is oriented to person, place, and time. She appears well-developed. She appears lethargic. She appears cachectic. She is uncooperative.  Non-toxic appearance. No distress. She is not intubated.  HENT:  Head: Normocephalic and atraumatic.  Mouth/Throat: Oropharynx is clear and moist. No oropharyngeal exudate.  Eyes: Conjunctivae, EOM and lids are normal. No scleral icterus.  Neck: Neck supple. No JVD present. No thyromegaly present.  Cardiovascular: Regular rhythm, S1 normal, S2 normal and normal heart sounds. Bradycardia present. Exam reveals no gallop, no S3, no S4, no distant heart sounds and no friction rub.  No murmur heard. Pulmonary/Chest: Effort normal and breath sounds normal. No accessory muscle usage or stridor. No apnea, no tachypnea and no bradypnea. She is not intubated. No respiratory distress. She has no decreased breath sounds. She has no wheezes. She has no rhonchi. She has no rales.  Abdominal: Soft. Bowel sounds are normal. She exhibits no distension. There is no tenderness. There is no rebound and no guarding.  Musculoskeletal: Normal range of motion. She exhibits no edema or tenderness.  Lymphadenopathy:    She has  no cervical adenopathy.  Neurological: She is oriented to person, place, and time. She appears lethargic. She is not disoriented.  Skin: Skin is warm, dry and intact. She is not diaphoretic. No erythema. No pallor.  Psychiatric: Her mood appears not anxious. Her affect is blunt. Her affect is not angry, not labile and not inappropriate. Her speech is not rapid and/or pressured, not delayed, not tangential and not slurred. She is slowed and withdrawn. She is not agitated, not aggressive, not hyperactive, not actively hallucinating and not combative. Thought  content is not paranoid and not delusional. She does not express impulsivity or inappropriate judgment. She does not exhibit a depressed mood. She is noncommunicative. She is inattentive.   LABORATORY PANEL:   CBC Recent Labs  Lab 03/14/18 1159  WBC 8.7  HGB 10.1*  HCT 30.9*  PLT 257   ------------------------------------------------------------------------------------------------------------------  Chemistries  Recent Labs  Lab 03/14/18 1159 03/15/18 2334  NA 139 142  K 3.7 3.6  CL 109 112*  CO2 21* 22  GLUCOSE 96 99  BUN 32* 27*  CREATININE 1.22* 0.84  CALCIUM 9.0 8.9  MG  --  1.8  AST 30  --   ALT 14  --   ALKPHOS 77  --   BILITOT 0.8  --    ------------------------------------------------------------------------------------------------------------------  Cardiac Enzymes Recent Labs  Lab 03/14/18 1159  TROPONINI <0.03   ------------------------------------------------------------------------------------------------------------------  RADIOLOGY:  Ct Head Wo Contrast  Result Date: 03/14/2018 CLINICAL DATA:  Paranoia and agitation.  History of anxiety and TIA. EXAM: CT HEAD WITHOUT CONTRAST TECHNIQUE: Contiguous axial images were obtained from the base of the skull through the vertex without intravenous contrast. COMPARISON:  CT head 02/12/2018. FINDINGS: Despite efforts by the technologist and patient, mild motion artifact is present on today's exam and could not be eliminated. This reduces exam sensitivity and specificity. Some images were repeated. Brain: There is no evidence of acute intracranial hemorrhage, mass lesion, brain edema or extra-axial fluid collection. The ventricles and subarachnoid spaces are appropriately sized for age. There is no CT evidence of acute cortical infarction. Vascular: Mild intracranial atherosclerosis. No hyperdense vessel identified. Skull: Negative for fracture or focal lesion. Sinuses/Orbits: The visualized paranasal sinuses and  mastoid air cells are clear. No orbital abnormalities are seen. Other: None. IMPRESSION: Unremarkable noncontrast head CT. No significant change from recent prior study. Electronically Signed   By: Richardean Sale M.D.   On: 03/14/2018 16:44   IMPRESSION AND PLAN:   A/P: 34F psychosis, AMS (2/2 medication), UTI, dehydration, bradycardia/bigeminy. -AMS 2/2 medications, expected to improve w/ time -CT head (-) acute intracranial abnl -U/A (+) leuk est, nitrite, WBC -(-)F/WBC, SIRS (-) -Ceftriaxone -ED EKGs/monitoring demonstrates sinus rhythm w/ PVCs, as well as bigeminy -Tele, continuous cardiac monitoring -EKG PRN -Cardiology consult -Psych consult -c/w home meds -IVF -Regular diet -Lovenox -Full code -Observation, < 2 midnights   All the records are reviewed and case discussed with ED provider. Management plans discussed with the patient, family and they are in agreement.  CODE STATUS: Full code  TOTAL TIME TAKING CARE OF THIS PATIENT: 60 minutes.    Arta Silence M.D on 03/16/2018 at 5:46 AM  Between 7am to 6pm - Pager - [phone]  After 6pm go to [email or web address] - Proofreader  [facility]  Office  [phone]  CC: Primary care physician; Patient, No Pcp Per   Note: This dictation was prepared with Dragon dictation along with smaller phrase technology. Any transcriptional errors that result from  this process are unintentional.

## 2018-03-16 NOTE — Progress Notes (Addendum)
[facility] [place] NAME: Diana West    MR#:  [id number]  DATE OF BIRTH:  August 17, 1956  SUBJECTIVE:  Patient seen and evaluated today Awake responds to verbal commands Has hallucinations Verbally very talkative and has constant drift of ideas Not oriented to time place and person    REVIEW OF SYSTEMS:    ROS   CONSTITUTIONAL: No documented fever. No fatigue, weakness. No weight gain, no weight loss.  EYES: No blurry or double vision.  ENT: No tinnitus. No postnasal drip. No redness of the oropharynx.  RESPIRATORY: No cough, no wheeze, no hemoptysis. No dyspnea.  CARDIOVASCULAR: No chest pain. No orthopnea. No palpitations. No syncope.  GASTROINTESTINAL: No nausea, no vomiting or diarrhea. No abdominal pain. No melena or hematochezia.  GENITOURINARY:  No urgency. No frequency. No dysuria. No hematuria. No obstructive symptoms. No discharge. No pain. No significant abnormal bleeding ENDOCRINE: No polyuria or nocturia. No heat or cold intolerance.  HEMATOLOGY: No anemia. No bruising. No bleeding. No purpura. No petechiae INTEGUMENTARY: No rashes. No lesions.  MUSCULOSKELETAL: No arthritis. No swelling. No gout.  NEUROLOGIC: No numbness, tingling, or ataxia. No seizure-type activity.  PSYCHIATRIC: Has psychosis    DRUG ALLERGIES:   Allergies  Allergen Reactions  . Codeine Itching and Rash  . Toradol [Ketorolac Tromethamine] Rash    VITALS:  Blood pressure (!) 113/58, pulse 94, temperature 98.4 F (36.9 C), temperature source Oral, resp. rate 20, height 5\' 6"  (1.676 m), weight 48.9 kg (107 lb 11.2 oz), SpO2 98 %.  PHYSICAL EXAMINATION:   Physical Exam  GENERAL:  62 y.o.-year-old patient lying in the bed with no acute distress.  EYES: Pupils equal, round, reactive to light and accommodation. No scleral icterus. Extraocular muscles intact.  HEENT: Head atraumatic, normocephalic. Oropharynx and nasopharynx clear.  NECK:   Supple, no jugular venous distention. No thyroid enlargement, no tenderness.  LUNGS: Normal breath sounds bilaterally, no wheezing, rales, rhonchi. No use of accessory muscles of respiration.  CARDIOVASCULAR: S1, S2 normal. No murmurs, rubs, or gallops.  ABDOMEN: Soft, nontender, nondistended. Bowel sounds present. No organomegaly or mass.  EXTREMITIES: No cyanosis, clubbing or edema b/l.    NEUROLOGIC: Cranial nerves II through XII are intact. No focal Motor or sensory deficits b/l.   PSYCHIATRIC: The patient is alert and oriented x 2 Has hallucination and delusions Psychosis SKIN: No obvious rash, lesion, or ulcer.   LABORATORY PANEL:   CBC Recent Labs  Lab 03/14/18 1159  WBC 8.7  HGB 10.1*  HCT 30.9*  PLT 257   ------------------------------------------------------------------------------------------------------------------ Chemistries  Recent Labs  Lab 03/14/18 1159 03/15/18 2334  NA 139 142  K 3.7 3.6  CL 109 112*  CO2 21* 22  GLUCOSE 96 99  BUN 32* 27*  CREATININE 1.22* 0.84  CALCIUM 9.0 8.9  MG  --  1.8  AST 30  --   ALT 14  --   ALKPHOS 77  --   BILITOT 0.8  --    ------------------------------------------------------------------------------------------------------------------  Cardiac Enzymes Recent Labs  Lab 03/14/18 1159  TROPONINI <0.03   ------------------------------------------------------------------------------------------------------------------  RADIOLOGY:  Ct Head Wo Contrast  Result Date: 03/14/2018 CLINICAL DATA:  Paranoia and agitation.  History of anxiety and TIA. EXAM: CT HEAD WITHOUT CONTRAST TECHNIQUE: Contiguous axial images were obtained from the base of the skull through the vertex without intravenous contrast. COMPARISON:  CT head 02/12/2018. FINDINGS: Despite efforts by the technologist and patient, mild motion artifact is present on today's  exam and could not be eliminated. This reduces exam sensitivity and specificity. Some  images were repeated. Brain: There is no evidence of acute intracranial hemorrhage, mass lesion, brain edema or extra-axial fluid collection. The ventricles and subarachnoid spaces are appropriately sized for age. There is no CT evidence of acute cortical infarction. Vascular: Mild intracranial atherosclerosis. No hyperdense vessel identified. Skull: Negative for fracture or focal lesion. Sinuses/Orbits: The visualized paranasal sinuses and mastoid air cells are clear. No orbital abnormalities are seen. Other: None. IMPRESSION: Unremarkable noncontrast head CT. No significant change from recent prior study. Electronically Signed   By: Richardean Sale M.D.   On: 03/14/2018 16:44     ASSESSMENT AND PLAN:  62 year old female patient with history of for anxiety disorder, arthritis, TIA in the past, psychosis currently under hospitalist service for bradycardia UTI dehydration.  -Acute gram-negative urinary tract infection Follow-up culture and sensitivity Continue IV Rocephin antibiotic  -Acute bradycardia improved Telemetry monitoring F/u cardiology consult  -Acute psychosis Psychiatric consultation 1:1 observation for safety  - Tobacco cessation counseled to patient for six minutes Nicotine patch offered  -DVT prophylaxis with subcu Lovenox daily   All the records are reviewed and case discussed with Care Management/Social Worker. Management plans discussed with the patient, family and they are in agreement.  CODE STATUS: Full code  DVT Prophylaxis: SCDs  TOTAL TIME TAKING CARE OF THIS PATIENT: 35 minutes.   POSSIBLE D/C IN 2 to 3 DAYS, DEPENDING ON CLINICAL CONDITION.  Saundra Shelling M.D on 03/16/2018 at 11:14 AM  Between 7am to 6pm - Pager - [phone]  After 6pm go to [email or web address] - password [email or web address] [facility]  Office  [phone]  CC: Primary care physician; Patient, No Pcp Per  Note: This dictation was prepared with Dragon dictation along with  smaller phrase technology. Any transcriptional errors that result from this process are unintentional.

## 2018-03-16 NOTE — Progress Notes (Signed)
The patient is admitted to room 251 with the diagnosis of bradycardia. Alert and oriented x 4 with some psychosis going on. Patient is having hallucinations and talking to imaginary people during admission process. No respiratory distress noted. Full assessment to epic completed. Patient is oriented to her room ascom and call belll. Patient is under IVC  and has a Air cabin crew in the room. Skin assessment done with Ena Dawley RN, no contraband or any safety hazard found on patient. Will continue to monitor.

## 2018-03-16 NOTE — ED Notes (Signed)
Pt with hr of 36 noted on monitor. Pt with matchable auscultated heartrate. Rhythm strips printed out.

## 2018-03-16 NOTE — ED Notes (Signed)
Sitter remains at bedside. Unlabored resps noted, cardiac monitor in place.

## 2018-03-16 NOTE — Progress Notes (Signed)
Initial Nutrition Assessment  DOCUMENTATION CODES:   Non-severe (moderate) malnutrition in context of chronic illness  INTERVENTION:  Ensure Enlive po BID, each supplement provides 350 kcal and 20 grams of protein  MVI w/ Minerals  Magic cup TID with meals, each supplement provides 290 kcal and 9 grams of protein  NUTRITION DIAGNOSIS:   Moderate Malnutrition related to chronic illness as evidenced by moderate fat depletion, moderate muscle depletion.  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 She reports UBW of 128 pounds but states she has lost around 20 pounds over the past 3 months. It appears her weight on 12/26/2017 of 125 pounds is an outlier compared to weights taken before and after that office visit, question accuracy.  Patient reports usual PO intake of: Cereal, oatmeal and toast, or egg sandwich, or microwave-able biscuit/sandwich for breakfast. Lunch normally consists of a sandwich or soup, along with sara lee cheese cakes or jello. Dinner is often a microwave-able meal like Stouffer's lasagana or something along those lines.  Today, she ate eggs, bacon and grits for breakfast. Appetite seems to be ok.  Will monitor PO intake. Given moderate fat depletions and moderate muscle wasting, patient meets criteria for moderate malnutrition in context of chronic illness. Unable to use weight history as criteria for severe malnutrition given fluctuations and patient's reported usual body weight being an outlier amongst the rest of her weight history in the chart.  Labs  reviewed  Medications reviewed and include:  40K+ BID LR at 46mL/hr   NUTRITION - FOCUSED PHYSICAL EXAM:    Most Recent Value  Orbital Region  Moderate depletion  Upper Arm Region  Severe depletion  Thoracic and Lumbar Region  Severe depletion  Buccal Region  Moderate depletion  Temple Region  Moderate depletion  Clavicle Bone Region  Moderate depletion  Clavicle and Acromion Bone Region  Moderate depletion  Scapular Bone Region  Moderate depletion  Dorsal Hand  Moderate depletion  Patellar Region  Moderate depletion  Anterior Thigh Region  Moderate depletion  Posterior Calf Region  Moderate depletion       Diet Order:   Diet Order           Diet regular Room service appropriate? Yes; Fluid consistency: Thin  Diet effective now          EDUCATION NEEDS:   Not appropriate for education at this time  Skin:  Skin Assessment: Skin Integrity Issues: Skin Integrity Issues:: Stage III Stage III: to L Finger  Last BM:  03/15/2018  Height:   Ht Readings from Last 1 Encounters:  03/16/18 5\' 6"  (1.676 m)    Weight:   Wt Readings from Last 1 Encounters:  03/16/18 107 lb 11.2 oz (48.9 kg)    Ideal Body Weight:  59.09 kg  BMI:  Body mass index is 17.38 kg/m.  Estimated Nutritional Needs:   Kcal:  1459-1702 calories  Protein:  73-83 grams (1.5-1.7g/kg)  Fluid:  >1.5L    Diana West. Diana Metzger, MS, RD LDN Inpatient Clinical Dietitian Pager [phone]

## 2018-03-16 NOTE — Progress Notes (Signed)
MD notified that pt needs orders for sitter d/t IVC and possible suicidal ideations. Sitter at bedside.

## 2018-03-16 NOTE — Consult Note (Signed)
Cardiology Consultation:   Patient ID: Diana West; [id number]; [birth year]   Admit date: 03/14/2018 Date of Consult: 03/16/2018  Primary Care Provider: Patient, No Pcp Per Primary Cardiologist: new to [facility] - consult by Arida   Patient Profile:   Diana West is a 62 y.o. female with a hx of reported PVCs, unknown psychiatric disorder, falls, pancreatic cyst, reported TIA and anxiety who presetned to [facility] with psychosis and is being seen today for the evaluation of PVCs at the request of Dr. Jodell Cipro.  History of Present Illness:   Ms. Wiley reports previously being seen by Dr. Dwain Sarna in [place], [place] for PVCs. She reports essentially an negative workup, though I do not have these for review. She has previously been scheduled to see [facility] for unknown reasons, though has called their office on multiple occasions noting significant psych issues and questions for her safety. She has not been seen by [facility] Cardiology. She was brought to [facility] secondary to AMS. Patient states she was brought here for "the raffle." She was noted to have a UTI and frequent PVCs, in a pattern of bigeminy. She has been asymptomatic. Potassium 3.7-->3.6, Mg++ 1.8, TSH 0.020. She has received IV magnesium. UDS positive for TCA. Troponin negative x 1. HGB 101. BP soft in the low 100s. She reports a 15 year pack history with ongoing tobacco abuse. She denies any illegal drugs. Never with any syncope.    Past Medical History:  Diagnosis Date  . Anxiety   . Arthritis   . Cardiac arrhythmia   . Falls frequently   . Palpitation   . Shingles    she states "they are inside and not contagious"  . TIA (transient ischemic attack)     Past Surgical History:  Procedure Laterality Date  . ABDOMINAL HYSTERECTOMY    . CESAREAN SECTION    . CHOLECYSTECTOMY       Home Meds: Prior to Admission medications   Not on File    Inpatient Medications: Scheduled Meds: . acetaminophen  1,000 mg  Oral BID  . ALPRAZolam  1 mg Oral TID  . amitriptyline  75 mg Oral QHS  . aspirin EC  81 mg Oral Daily  . enoxaparin (LOVENOX) injection  40 mg Subcutaneous Q24H  . gabapentin  600 mg Oral TID  . haloperidol  0.5 mg Oral Q8H  . haloperidol lactate  5 mg Intramuscular Once  . levothyroxine  125 mcg Oral QAC breakfast  . potassium chloride  40 mEq Oral BID  . sertraline  100 mg Oral Daily   Continuous Infusions: . cefTRIAXone (ROCEPHIN)  IV Stopped (03/16/18 0641)  . lactated ringers 50 mL/hr at 03/16/18 0606  . magnesium sulfate 1 - 4 g bolus IVPB 2 g (03/16/18 0636)   PRN Meds: acetaminophen **OR** acetaminophen, bisacodyl, hydrOXYzine, ondansetron **OR** ondansetron (ZOFRAN) IV, senna-docusate  Allergies:   Allergies  Allergen Reactions  . Codeine Itching and Rash  . Toradol [Ketorolac Tromethamine] Rash    Social History:   Social History   Socioeconomic History  . Marital status: Divorced    Spouse name: Not on file  . Number of children: Not on file  . Years of education: Not on file  . Highest education level: Not on file  Occupational History  . Not on file  Social Needs  . Financial resource strain: Not on file  . Food insecurity:    Worry: Not on file    Inability: Not on file  .  Transportation needs:    Medical: Not on file    Non-medical: Not on file  Tobacco Use  . Smoking status: Current Some Day Smoker    Packs/day: 0.25    Types: Cigarettes  . Smokeless tobacco: Never Used  Substance and Sexual Activity  . Alcohol use: No  . Drug use: No  . Sexual activity: Not on file  Lifestyle  . Physical activity:    Days per week: Not on file    Minutes per session: Not on file  . Stress: Not on file  Relationships  . Social connections:    Talks on phone: Not on file    Gets together: Not on file    Attends religious service: Not on file    Active member of club or organization: Not on file    Attends meetings of clubs or organizations: Not on file     Relationship status: Not on file  . Intimate partner violence:    Fear of current or ex partner: Not on file    Emotionally abused: Not on file    Physically abused: Not on file    Forced sexual activity: Not on file  Other Topics Concern  . Not on file  Social History Narrative   Lives at home by herself. Has a walker to ambulate     Family History:   Family History  Problem Relation Age of Onset  . CAD Mother   . CAD Father     ROS:  Review of Systems  Unable to perform ROS: Psychiatric disorder      Physical Exam/Data:   Vitals:   03/16/18 0330 03/16/18 0400 03/16/18 0505 03/16/18 0525  BP: (!) 103/55 (!) 102/44 (!) 100/42   Pulse: (!) 41 (!) 42 (!) 101   Resp: 16 19 12    Temp:   98.3 F (36.8 C)   TempSrc:   Oral   SpO2: 97% 98% 95%   Weight:    107 lb 11.2 oz (48.9 kg)  Height:    5\' 6"  (1.676 m)    Intake/Output Summary (Last 24 hours) at 03/16/2018 0732 Last data filed at 03/16/2018 0700 Gross per 24 hour  Intake 305 ml  Output -  Net 305 ml   Filed Weights   03/14/18 1155 03/16/18 0525  Weight: 105 lb (47.6 kg) 107 lb 11.2 oz (48.9 kg)   Body mass index is 17.38 kg/m.   Physical Exam: General: Well developed, well nourished, in no acute distress. Head: Normocephalic, atraumatic, sclera non-icteric, no xanthomas, nares without discharge.  Neck: Negative for carotid bruits. JVD not elevated. Lungs: Clear bilaterally to auscultation without wheezes, rales, or rhonchi. Breathing is unlabored. Heart: RRR with S1 S2. No murmurs, rubs, or gallops appreciated. Abdomen: Soft, non-tender, non-distended with normoactive bowel sounds. No hepatomegaly. No rebound/guarding. No obvious abdominal masses. Msk:  Strength and tone appear normal for age. Extremities: No clubbing or cyanosis. No edema. Distal pedal pulses are 2+ and equal bilaterally. Neuro: Alert and oriented X 3. No facial asymmetry. No focal deficit. Moves all extremities spontaneously. Psych:   Responds to questions appropriately with a normal affect.   EKG:  The EKG was personally reviewed and demonstrates: NSR, 88 bpm, occasional PVCs, nonspecific st/t changes Telemetry:  Telemetry was personally reviewed and demonstrates: Currently in NSR, previously with frequent PVCs mostly in a pattern of bigeminy, no evidence of bradycardia   Weights: Filed Weights   03/14/18 1155 03/16/18 0525  Weight: 105 lb (47.6 kg) 107  lb 11.2 oz (48.9 kg)    Relevant CV Studies: TTE pending  Laboratory Data:  Chemistry Recent Labs  Lab 03/14/18 1159 03/15/18 2334  NA 139 142  K 3.7 3.6  CL 109 112*  CO2 21* 22  GLUCOSE 96 99  BUN 32* 27*  CREATININE 1.22* 0.84  CALCIUM 9.0 8.9  GFRNONAA 46* >60  GFRAA 54* >60  ANIONGAP 9 8    Recent Labs  Lab 03/14/18 1159  PROT 7.8  ALBUMIN 4.1  AST 30  ALT 14  ALKPHOS 77  BILITOT 0.8   Hematology Recent Labs  Lab 03/14/18 1159  WBC 8.7  RBC 3.67*  HGB 10.1*  HCT 30.9*  MCV 84.2  MCH 27.6  MCHC 32.8  RDW 18.4*  PLT 257   Cardiac Enzymes Recent Labs  Lab 03/14/18 1159  TROPONINI <0.03   No results for input(s): TROPIPOC in the last 168 hours.  BNPNo results for input(s): BNP, PROBNP in the last 168 hours.  DDimer No results for input(s): DDIMER in the last 168 hours.  Radiology/Studies:  Ct Head Wo Contrast  Result Date: 03/14/2018 IMPRESSION: Unremarkable noncontrast head CT. No significant change from recent prior study. Electronically Signed   By: Richardean Sale M.D.   On: 03/14/2018 16:44    Assessment and Plan:   1. Frequent PVCs: -Asymptomatic -Reports a prior work up in [place] with Dr. Dwain Sarna, need to obtain records, will place order for patient to sign records release -Needs to obtain an accurate prior to admission medication list for review -Check TTE -BP too soft for addition of beta blocker at this time  2. Psychosis: -Per IM/psych -Patient thinks she is here for "the raffle" -Need to obtain an  accurate medication list as above -Sitter in room  3. Abnormal TSH: -Check free T4 and total T3 -Cannot rule out if this is playing a role in her PVCs  4. Subclinical hypokalemia and hypomagnesemia: -Replete to goal > 4.0 and > 2.0 respectively   5. UTI: -Per IM   For questions or updates, please contact [place] Please consult [email or web address] for contact info under Cardiology/STEMI.   Signed, Christell Faith, PA-C [place] Pager: [phone] 03/16/2018, 7:32 AM

## 2018-03-17 ENCOUNTER — Inpatient Hospital Stay: Admit: 2018-03-17 | Payer: Medicare Other

## 2018-03-17 DIAGNOSIS — G9340 Encephalopathy, unspecified: Secondary | ICD-10-CM | POA: Diagnosis present

## 2018-03-17 DIAGNOSIS — F419 Anxiety disorder, unspecified: Secondary | ICD-10-CM

## 2018-03-17 DIAGNOSIS — F331 Major depressive disorder, recurrent, moderate: Secondary | ICD-10-CM

## 2018-03-17 LAB — BASIC METABOLIC PANEL
Anion gap: 5 (ref 5–15)
BUN: 18 mg/dL (ref 6–20)
CHLORIDE: 112 mmol/L — AB (ref 101–111)
CO2: 23 mmol/L (ref 22–32)
CREATININE: 0.7 mg/dL (ref 0.44–1.00)
Calcium: 8.6 mg/dL — ABNORMAL LOW (ref 8.9–10.3)
GFR calc Af Amer: >60 mL/min (ref >60)
GFR calc non Af Amer: >60 mL/min (ref >60)
GLUCOSE: 89 mg/dL (ref 65–99)
Potassium: 4.6 mmol/L (ref 3.5–5.1)
Sodium: 140 mmol/L (ref 135–145)

## 2018-03-17 LAB — CBC
HCT: 26.2 % — ABNORMAL LOW (ref 35.0–47.0)
Hemoglobin: 8.7 g/dL — ABNORMAL LOW (ref 12.0–16.0)
MCH: 28.2 pg (ref 26.0–34.0)
MCHC: 33.4 g/dL (ref 32.0–36.0)
MCV: 84.4 fL (ref 80.0–100.0)
PLATELETS: 237 10*3/uL (ref 150–440)
RBC: 3.1 MIL/uL — ABNORMAL LOW (ref 3.80–5.20)
RDW: 19 % — ABNORMAL HIGH (ref 11.5–14.5)
WBC: 5.5 10*3/uL (ref 3.6–11.0)

## 2018-03-17 LAB — T3: T3, Total: 76 ng/dL (ref 71–180)

## 2018-03-17 LAB — URINE CULTURE

## 2018-03-17 MED ORDER — SERTRALINE HCL 100 MG PO TABS: 100.0000 mg | ORAL_TABLET | Freq: Two times a day (BID) | ORAL | 0 refills | Status: DC

## 2018-03-17 MED ORDER — CEPHALEXIN 500 MG PO CAPS: 500.0000 mg | ORAL_CAPSULE | Freq: Four times a day (QID) | ORAL | 0 refills | Status: AC

## 2018-03-17 MED ORDER — SERTRALINE HCL 50 MG PO TABS: 50.0000 mg | ORAL_TABLET | Freq: Two times a day (BID) | ORAL | 0 refills | Status: DC

## 2018-03-17 NOTE — Discharge Summary (Addendum)
[facility] NAME: Kieren Ricci    MR#:  [id number]  DATE OF BIRTH:  1955-10-12  DATE OF ADMISSION:  03/14/2018 ADMITTING PHYSICIAN: Arta Silence, MD  DATE OF DISCHARGE: No discharge date for patient encounter.  PRIMARY CARE PHYSICIAN: Patient, No Pcp Per   ADMISSION DIAGNOSIS:  Bradycardia [R00.1] Bigeminy [I49.9] Psychosis, unspecified psychosis type ([place]) [F29] Urinary tract infection without hematuria, site unspecified [N39.0]  DISCHARGE DIAGNOSIS:  Principal Problem:   Recurrent moderate major depressive disorder with anxiety (HCC) Active Problems:   Pressure injury of skin   Metabolic encephalopathy   Malnutrition of moderate degree   Acute encephalopathy E. coli urinary tract infection  SECONDARY DIAGNOSIS:   Past Medical History:  Diagnosis Date  . Anxiety   . Arthritis   . Cardiac arrhythmia   . Falls frequently   . Palpitation   . Shingles    she states "they are inside and not contagious"  . TIA (transient ischemic attack)      ADMITTING HISTORY Mozetta Murfin  is a 62 y.o. female with a known history of anxiety, TIA who p/w psychosis. Received Geodon, is very lethargic/somnolent, AAOx3 per nursing, arousable but falls asleep quickly, not able to provide history. Also unable to obtain complete ROS. Denies CP/SOB. Bradycardia, HR as low as 30s in ED, EKG w/ bigeminy. K+/Mag levels WNL. U/A (+) UTI  HOSPITAL COURSE:  Patient was admitted to telemetry.  She was started on IV Rocephin antibiotic for urinary tract infection.  Her urine cultures grew E. Coli.  Patient was put on one-on-one observation for safety at the time of admission and psychiatry department was consulted for depression, psychosis. patient also had dehydration and she was hydrated with IV fluids.  She was evaluated by cardiology for bradycardia her bradycardia improved with IV fluids.  Her troponin was negative.  patient was evaluated by  psychiatry attending for depression and psychosis.  Patient does not meet any criteria for IVC.  One-on-one observation was discontinued.  Advised to continue current home medications and follow-up with her primary psychiatrist in [place] Dr Al Corpus.  Patient will be discharged to home on oral Keflex antibiotic for another 7 days.  Her mental status improved and she is back to baseline.  No fevers.  She tolerated diet well.  Patient hemodynamically stable will be discharged home.  CONSULTS OBTAINED:  Treatment Team:  Arta Silence, MD Clovis Fredrickson, MD  DRUG ALLERGIES:   Allergies  Allergen Reactions  . Codeine Itching and Rash  . Toradol [Ketorolac Tromethamine] Rash    DISCHARGE MEDICATIONS:   Allergies as of 03/17/2018      Reactions   Codeine Itching, Rash   Toradol [ketorolac Tromethamine] Rash      Medication List    TAKE these medications   ALPRAZolam 0.5 MG tablet Commonly known as:  XANAX Take 0.5 mg by mouth 3 (three) times daily as needed for anxiety.   amitriptyline 150 MG tablet Commonly known as:  ELAVIL Take 150 mg by mouth at bedtime.   cephALEXin 500 MG capsule Commonly known as:  KEFLEX Take 1 capsule (500 mg total) by mouth 4 (four) times daily for 7 days.   gabapentin 600 MG tablet Commonly known as:  NEURONTIN Take 600 mg by mouth 3 (three) times daily.   levothyroxine 125 MCG tablet Commonly known as:  SYNTHROID, LEVOTHROID Take 125 mcg by mouth daily before breakfast.   sertraline 100 MG tablet Commonly known as:  ZOLOFT Take 1 tablet (100 mg total) by mouth 2 (two) times daily.       Today  Patient seen and evaluated today Tolerating diet well  no fever   VITAL SIGNS:  Blood pressure (!) 107/57, pulse 81, temperature 98.2 F (36.8 C), temperature source Oral, resp. rate 16, height 5\' 6"  (1.676 m), weight 48.9 kg (107 lb 11.2 oz), SpO2 98 %.  I/O:    Intake/Output Summary (Last 24 hours) at 03/17/2018 1439 Last  data filed at 03/17/2018 1100 Gross per 24 hour  Intake 1315.83 ml  Output 800 ml  Net 515.83 ml    PHYSICAL EXAMINATION:  Physical Exam  GENERAL:  62 y.o.-year-old patient lying in the bed with no acute distress.  LUNGS: Normal breath sounds bilaterally, no wheezing, rales,rhonchi or crepitation. No use of accessory muscles of respiration.  CARDIOVASCULAR: S1, S2 normal. No murmurs, rubs, or gallops.  ABDOMEN: Soft, non-tender, non-distended. Bowel sounds present. No organomegaly or mass.  NEUROLOGIC: Moves all 4 extremities. PSYCHIATRIC: The patient is alert and oriented x 3.  SKIN: No obvious rash, lesion, or ulcer.   DATA REVIEW:   CBC Recent Labs  Lab 03/17/18 0509  WBC 5.5  HGB 8.7*  HCT 26.2*  PLT 237    Chemistries  Recent Labs  Lab 03/14/18 1159 03/15/18 2334 03/17/18 0509  NA 139 142 140  K 3.7 3.6 4.6  CL 109 112* 112*  CO2 21* 22 23  GLUCOSE 96 99 89  BUN 32* 27* 18  CREATININE 1.22* 0.84 0.70  CALCIUM 9.0 8.9 8.6*  MG  --  1.8  --   AST 30  --   --   ALT 14  --   --   ALKPHOS 77  --   --   BILITOT 0.8  --   --     Cardiac Enzymes Recent Labs  Lab 03/14/18 1159  TROPONINI <0.03    Microbiology Results  Results for orders placed or performed during the hospital encounter of 03/14/18  Urine Culture     Status: Abnormal   Collection Time: 03/14/18  2:29 PM  Result Value Ref Range Status   Specimen Description   Final    URINE, RANDOM Performed at [facility], [address] [place], [place] [address]    Special Requests   Final    NONE Performed at [facility], [address] [place], [place] [address]    Culture >=100,000 COLONIES/mL ESCHERICHIA COLI (A)  Final   Report Status 03/17/2018 FINAL  Final   Organism ID, Bacteria ESCHERICHIA COLI (A)  Final      Susceptibility   Escherichia coli - MIC*    AMPICILLIN >=32 RESISTANT Resistant     CEFAZOLIN <=4 SENSITIVE Sensitive     CEFTRIAXONE <=1 SENSITIVE  Sensitive     CIPROFLOXACIN >=4 RESISTANT Resistant     GENTAMICIN <=1 SENSITIVE Sensitive     IMIPENEM <=0.25 SENSITIVE Sensitive     NITROFURANTOIN <=16 SENSITIVE Sensitive     TRIMETH/SULFA >=320 RESISTANT Resistant     AMPICILLIN/SULBACTAM >=32 RESISTANT Resistant     PIP/TAZO <=4 SENSITIVE Sensitive     Extended ESBL NEGATIVE Sensitive     * >=100,000 COLONIES/mL ESCHERICHIA COLI    RADIOLOGY:  No results found.  Follow up with PCP in 1 week.  Management plans discussed with the patient, family and they are in agreement.  CODE STATUS: Full code    Code Status Orders  (From admission, onward)  Start     Ordered   03/16/18 0513  Full code  Continuous     03/16/18 0512    Code Status History    Date Active Date Inactive Code Status Order ID Comments User Context   02/14/2018 0028 02/14/2018 1935 Full Code [id number]  Amelia Jo, MD Inpatient   07/22/2017 1353 07/24/2017 1733 Full Code [id number]  Gladstone Lighter, MD Inpatient      TOTAL TIME TAKING CARE OF THIS PATIENT ON DAY OF DISCHARGE: more than 34 minutes.   Saundra Shelling M.D on 03/17/2018 at 2:39 PM  Between 7am to 6pm - Pager - [phone]  After 6pm go to [email or web address] - password [email or web address] [facility]  Office  [phone]  CC: Primary care physician; Patient, No Pcp Per  Note: This dictation was prepared with Dragon dictation along with smaller phrase technology. Any transcriptional errors that result from this process are unintentional.

## 2018-03-17 NOTE — Plan of Care (Signed)
  Problem: Clinical Measurements: Goal: Cardiovascular complication will be avoided Outcome: Progressing   Problem: Activity: Goal: Risk for activity intolerance will decrease Outcome: Progressing   Problem: Nutrition: Goal: Adequate nutrition will be maintained Outcome: Progressing   Problem: Coping: Goal: Level of anxiety will decrease Outcome: Progressing   Problem: Safety: Goal: Ability to remain free from injury will improve Outcome: Progressing

## 2018-03-17 NOTE — Care Management (Addendum)
RNCM reviewed recent Care Everywhere and patient has had multiple appointments at [facility].  Most appointments were cancelled by patient.  It appears she has used [facility] home health in recent past. Message left for patient's son Cristie Hem for assistance with PCP/follow up appointments.  [facility] Social worker noted: Telephone Encounter - Mauri Reading, LCSW - 01/14/2018 10:24 AM EDT SW received voicemail from pt with request for call back. Call was returned and pt expressed that she is having difficulty arranging transportation for her appointment tomorrow. Stated that she might have someone from her church bring her but that the ride she set up previously fell through.  SW shared information regarding [employer] transportation(P: [phone]) but pt shared that she would not be able to utilize those services b/c she checked in with them yesterday and found that that she would have to stay in the area all day since the bus drops folks off around 5am and does not leave until 5pm.  Pt informed that unfortunately there are no other transportation options available to her at this time b/c of her insurance and location. Pt requested her appointment not be cancelled and stated that she would notify the clinic if she was not able to find someone today.  SW shared with pt information about [facility]([address] [place], [place] [address], P: [phone])

## 2018-03-17 NOTE — Care Management (Signed)
Message sent to [facility] RN to see if patient is code 9.

## 2018-03-17 NOTE — Consult Note (Signed)
[facility] Consult   Reason for Consult:  hallucinations Referring Physician:  Dr. Leroy Libman Patient Identification: Diana West MRN:  [id number] Principal Diagnosis: Recurrent moderate major depressive disorder with anxiety [facility]) Diagnosis:   Patient Active Problem List   Diagnosis Date Noted  . Recurrent moderate major depressive disorder with anxiety ([place]) [F33.1, F41.9] 03/16/2018    Priority: High    Class: Acute  . Acute encephalopathy [G93.40] 03/17/2018  . Pressure injury of skin [L89.90] 03/16/2018  . Metabolic encephalopathy [Y86.57] 03/16/2018  . Malnutrition of moderate degree [E44.0] 03/16/2018  . Acute UTI [N39.0] 02/13/2018  . Pancreatic cyst [K86.2] 11/12/2017  . Protein-calorie malnutrition, severe [E43] 07/23/2017  . Acute cystitis [N30.00] 07/22/2017  . TIA (transient ischemic attack) [G45.9] 07/06/2017  . Hypotension [I95.9] 06/26/2017  . Falls frequently [R29.6] 06/26/2017  . Anemia [D64.9] 06/26/2017  . Depression with anxiety [F41.8] 06/26/2017  . FH: premature coronary heart disease [Z82.49] 06/26/2017  . Lumbar disc disease [M51.9] 06/26/2017  . Cervical disc disease [M50.90] 06/26/2017  . Chronic headaches [R51] 06/26/2017  . History of PSVT (paroxysmal supraventricular tachycardia) [Z86.79] 06/26/2017  . Tinea corporis [B35.4] 06/26/2017  . Postherpetic neuralgia [B02.29] 06/26/2017  . CKD (chronic kidney disease) stage 3, GFR 30-59 ml/min (HCC) [N18.3] 06/26/2017    Total Time spent with patient: 1 hour  Subjective:    Identifying data. Diana West is a 62 year old female with a history of anxiety and depression.  Chief complaint. "I am not hallucinating."  History of present illness. Information was obtained from the patient and the chart. The patient was brought to the ER for confusion, bizarre behavior and hallucinations, presumably the signs of mental illness. She was to be admitted to psychiatry but was diagnosed  with UTI and bradycardia and admitted to medical floor. With treatment, all psychiatric symptoms have resolved. The patient is not depressed, anxious, psychotic or confused. She is not suicidal or homicidal. She does not use drugs or alcohol. She sees a psychiatrist, Dr. Al Corpus in [facility], regularly. He prescribes Zoloft, Elavil and Xanax 1 mg TID. She has been compliant with medications but noticed that some of her Xanax has been disappearing.   She does have multiple stressors. She relocated to [place] to be closer to her son but they have no time together. She has not developed any connections here. Her general health has been deteriorating and, among other things, she has been "fainting/falling". Because of fainting. She gave up driving and is stranded in her apartment. She was toled that she has a "spot" on her pancreas and lungs. Her cousin died of pancreatic cancer and she is worried. Her follow up with oncologist is in two days. The patient recognizes me from previous admission in a similar scenario.  Past psychiatric history. Long history of anxiety and depression. She was hospitalized many yearsd before while in crisis. Denies suicide attempts.  Family psychiatric history. None.  Social history. Lives independently but approached social services about moving into assisted living facility. In fact, she has her first meeting with a social worker who will show her around facilities.       Risk to Self: Suicidal Ideation: No Suicidal Intent: No Is patient at risk for suicide?: No, but patient needs Medical Clearance Suicidal Plan?: No Access to Means: Yes(Possibly knives in home, pt adminsters own meds) Specify Access to Suicidal Means: (Possibly knives in home, pt adminsters own meds) What has been your use of drugs/alcohol within the last 12 months?: none How many  times?: 1 Other Self Harm Risks: none Triggers for Past Attempts: Hallucinations, Unpredictable Intentional Self Injurious  Behavior: None Risk to Others: Homicidal Ideation: No Thoughts of Harm to Others: No Current Homicidal Intent: No Current Homicidal Plan: No Access to Homicidal Means: No Identified Victim: None identified History of harm to others?: No Assessment of Violence: None Noted Violent Behavior Description: (None noted) Does patient have access to weapons?: (Pt denies, pt exhibiting psychosis) Criminal Charges Pending?: No Does patient have a court date: No Prior Inpatient Therapy: Prior Inpatient Therapy: No Prior Outpatient Therapy: Prior Outpatient Therapy: No Does patient have an ACCT team?: No Does patient have Intensive In-House Services?  : No Does patient have [facility] services? : No Does patient have P4CC services?: No  Past Medical History:  Past Medical History:  Diagnosis Date  . Anxiety   . Arthritis   . Cardiac arrhythmia   . Falls frequently   . Palpitation   . Shingles    she states "they are inside and not contagious"  . TIA (transient ischemic attack)     Past Surgical History:  Procedure Laterality Date  . ABDOMINAL HYSTERECTOMY    . CESAREAN SECTION    . CHOLECYSTECTOMY     Family History:  Family History  Problem Relation Age of Onset  . CAD Mother   . CAD Father     Social History:  Social History   Substance and Sexual Activity  Alcohol Use No     Social History   Substance and Sexual Activity  Drug Use No    Social History   Socioeconomic History  . Marital status: Divorced    Spouse name: Not on file  . Number of children: Not on file  . Years of education: Not on file  . Highest education level: Not on file  Occupational History  . Not on file  Social Needs  . Financial resource strain: Not on file  . Food insecurity:    Worry: Not on file    Inability: Not on file  . Transportation needs:    Medical: Not on file    Non-medical: Not on file  Tobacco Use  . Smoking status: Current Some Day Smoker    Packs/day: 0.25     Types: Cigarettes  . Smokeless tobacco: Never Used  Substance and Sexual Activity  . Alcohol use: No  . Drug use: No  . Sexual activity: Not on file  Lifestyle  . Physical activity:    Days per week: Not on file    Minutes per session: Not on file  . Stress: Not on file  Relationships  . Social connections:    Talks on phone: Not on file    Gets together: Not on file    Attends religious service: Not on file    Active member of club or organization: Not on file    Attends meetings of clubs or organizations: Not on file    Relationship status: Not on file  Other Topics Concern  . Not on file  Social History Narrative   Lives at home by herself. Has a walker to ambulate   Additional Social History:    Allergies:   Allergies  Allergen Reactions  . Codeine Itching and Rash  . Toradol [Ketorolac Tromethamine] Rash    Labs:  Results for orders placed or performed during the hospital encounter of 03/14/18 (from the past 48 hour(s))  Basic metabolic panel     Status: Abnormal   Collection Time:  03/15/18 11:34 PM  Result Value Ref Range   Sodium 142 135 - 145 mmol/L   Potassium 3.6 3.5 - 5.1 mmol/L   Chloride 112 (H) 101 - 111 mmol/L   CO2 22 22 - 32 mmol/L   Glucose, Bld 99 65 - 99 mg/dL   BUN 27 (H) 6 - 20 mg/dL   Creatinine, Ser 0.84 0.44 - 1.00 mg/dL   Calcium 8.9 8.9 - 10.3 mg/dL   GFR calc non Af Amer >60 >60 mL/min   GFR calc Af Amer >60 >60 mL/min    Comment: (NOTE) The eGFR has been calculated using the CKD EPI equation. This calculation has not been validated in all clinical situations. eGFR's persistently <60 mL/min signify possible Chronic Kidney Disease.    Anion gap 8 5 - 15    Comment: Performed at [facility], [address] [place], [place] [address]  Magnesium     Status: None   Collection Time: 03/15/18 11:34 PM  Result Value Ref Range   Magnesium 1.8 1.7 - 2.4 mg/dL    Comment: Performed at [facility], [address] [place], [place] [address]  T4, free     Status: None   Collection Time: 03/15/18 11:34 PM  Result Value Ref Range   Free T4 1.42 0.82 - 1.77 ng/dL    Comment: (NOTE) Biotin ingestion may interfere with free T4 tests. If the results are inconsistent with the TSH level, previous test results, or the clinical presentation, then consider biotin interference. If needed, order repeat testing after stopping biotin. Performed at [facility], [address] [place], [place] [address]   T3     Status: None   Collection Time: 03/15/18 11:34 PM  Result Value Ref Range   T3, Total 76 71 - 180 ng/dL    Comment: (NOTE) Performed At: [facility] [place], [place] [id number] Rush Farmer MD [id number] Performed at [facility], [address] [place], [place] [address]   Basic metabolic panel     Status: Abnormal   Collection Time: 03/17/18  5:09 AM  Result Value Ref Range   Sodium 140 135 - 145 mmol/L   Potassium 4.6 3.5 - 5.1 mmol/L   Chloride 112 (H) 101 - 111 mmol/L   CO2 23 22 - 32 mmol/L   Glucose, Bld 89 65 - 99 mg/dL   BUN 18 6 - 20 mg/dL   Creatinine, Ser 0.70 0.44 - 1.00 mg/dL   Calcium 8.6 (L) 8.9 - 10.3 mg/dL   GFR calc non Af Amer >60 >60 mL/min   GFR calc Af Amer >60 >60 mL/min    Comment: (NOTE) The eGFR has been calculated using the CKD EPI equation. This calculation has not been validated in all clinical situations. eGFR's persistently <60 mL/min signify possible Chronic Kidney Disease.    Anion gap 5 5 - 15    Comment: Performed at [facility], [address] [place], [place] [address]  CBC     Status: Abnormal   Collection Time: 03/17/18  5:09 AM  Result Value Ref Range   WBC 5.5 3.6 - 11.0 K/uL   RBC 3.10 (L) 3.80 - 5.20 MIL/uL   Hemoglobin 8.7 (L) 12.0 - 16.0 g/dL   HCT 26.2 (L) 35.0 - 47.0 %   MCV 84.4 80.0 - 100.0 fL   MCH 28.2 26.0 - 34.0 pg   MCHC 33.4 32.0 - 36.0 g/dL   RDW 19.0 (H) 11.5 -  14.5 %   Platelets 237 150 - 440 K/uL    Comment: Performed at [facility], [address] [place], [place] [address]    Current Facility-Administered Medications  Medication Dose Route Frequency Provider Last Rate Last Dose  . acetaminophen (TYLENOL) tablet 650 mg  650 mg Oral Q6H PRN Arta Silence, MD   650 mg at 03/17/18 0930   Or  . acetaminophen (TYLENOL) suppository 650 mg  650 mg Rectal Q6H PRN Arta Silence, MD      . ALPRAZolam Duanne Moron) tablet 1 mg  1 mg Oral TID Arta Silence, MD   1 mg at 03/17/18 0930  . amitriptyline (ELAVIL) tablet 75 mg  75 mg Oral QHS Arta Silence, MD   75 mg at 03/16/18 2200  . aspirin EC tablet 81 mg  81 mg Oral Daily Arta Silence, MD   81 mg at 03/17/18 0930  . bisacodyl (DULCOLAX) EC tablet 5 mg  5 mg Oral Daily PRN Arta Silence, MD      . cefTRIAXone (ROCEPHIN) 1 g in sodium chloride 0.9 % 100 mL IVPB  1 g Intravenous Q24H Arta Silence, MD   Stopped at 03/17/18 2229  . enoxaparin (LOVENOX) injection 40 mg  40 mg Subcutaneous Q24H Arta Silence, MD   40 mg at 03/17/18 0531  . feeding supplement (ENSURE ENLIVE) (ENSURE ENLIVE) liquid 237 mL  237 mL Oral BID BM Pyreddy, Pavan, MD   237 mL at 03/17/18 0930  . gabapentin (NEURONTIN) tablet 600 mg  600 mg Oral TID Arta Silence, MD   600 mg at 03/17/18 0930  . haloperidol (HALDOL) tablet 0.5 mg  0.5 mg Oral Q8H Sridharan, Prasanna, MD   0.5 mg at 03/17/18 0532  . haloperidol lactate (HALDOL) injection 5 mg  5 mg Intramuscular Once Arta Silence, MD      . hydrOXYzine (ATARAX/VISTARIL) tablet 12.5 mg  12.5 mg Oral TID PRN Arta Silence, MD      . levothyroxine (SYNTHROID, LEVOTHROID) tablet 125 mcg  125 mcg Oral QAC breakfast Arta Silence, MD   125 mcg at 03/16/18 0923  . multivitamin with minerals tablet 1 tablet  1 tablet Oral Daily Pyreddy, Reatha Harps, MD   1 tablet at 03/17/18 0930  . nicotine (NICODERM CQ - dosed in mg/24  hours) patch 21 mg  21 mg Transdermal Daily Pyreddy, Reatha Harps, MD   21 mg at 03/17/18 0932  . ondansetron (ZOFRAN) tablet 4 mg  4 mg Oral Q6H PRN Arta Silence, MD       Or  . ondansetron (ZOFRAN) injection 4 mg  4 mg Intravenous Q6H PRN Arta Silence, MD      . oxyCODONE-acetaminophen (PERCOCET/ROXICET) 5-325 MG per tablet 1 tablet  1 tablet Oral Q6H PRN Saundra Shelling, MD   1 tablet at 03/17/18 1211  . senna-docusate (Senokot-S) tablet 1 tablet  1 tablet Oral QHS PRN Arta Silence, MD      . sertraline (ZOLOFT) tablet 100 mg  100 mg Oral Daily Arta Silence, MD   100 mg at 03/17/18 0930    Musculoskeletal: Strength & Muscle Tone: within normal limits Gait & Station: normal Patient leans: N/A  Psychiatric Specialty Exam: Physical Exam  Nursing note and vitals reviewed. Psychiatric: She has a normal mood and affect. Her speech is normal and behavior is normal. Judgment and thought content normal. Cognition and memory are normal.    Review of Systems  Neurological: Negative.   Psychiatric/Behavioral: Positive for depression. The patient is nervous/anxious.  All other systems reviewed and are negative.   Blood pressure (!) 107/57, pulse 81, temperature 98.2 F (36.8 C), temperature source Oral, resp. rate 16, height 5' 6"  (1.676 m), weight 48.9 kg (107 lb 11.2 oz), SpO2 98 %.Body mass index is 17.38 kg/m.  General Appearance: Casual  Eye Contact:  Good  Speech:  Clear and Coherent  Volume:  Normal  Mood:  Euthymic  Affect:  Appropriate  Thought Process:  Goal Directed and Descriptions of Associations: Intact  Orientation:  Full (Time, Place, and Person)  Thought Content:  WDL  Suicidal Thoughts:  No  Homicidal Thoughts:  No  Memory:  Immediate;   Fair Recent;   Fair Remote;   Fair  Judgement:  Fair  Insight:  Present  Psychomotor Activity:  Decreased  Concentration:  Concentration: Fair and Attention Span: Fair  Recall:  [employer] of Knowledge:  Fair   Language:  Fair  Akathisia:  No  Handed:  Right  AIMS (if indicated):     Assets:  Communication Skills Desire for Improvement Financial Resources/Insurance Housing Resilience Social Support  ADL's:  Intact  Cognition:  WNL  Sleep:        Treatment Plan Summary: Daily contact with patient to assess and evaluate symptoms and progress in treatment and Medication management   PLAN: The patient no longer meets criteria for IVC. I discontinued IVC and sitter. Please discharge as appropriate.  Continue current medications. Follow up with her primary psychiatrist.  Disposition: No evidence of imminent risk to self or others at present.   Patient does not meet criteria for psychiatric inpatient admission. Supportive therapy provided about ongoing stressors. Discussed crisis plan, support from social network, calling 911, coming to the Emergency Department, and calling Suicide Hotline.  Orson Slick, MD 03/17/2018 2:24 PM

## 2018-03-17 NOTE — Consult Note (Signed)
Ms. Shadix no longerb meets criteria for IVC. I will terminate proceedings and d/c sitter.  Please discharge as appropriate.  She is to continue current medications and follow up with Dr. Al Corpus, her primary psychiatrist in [facility]. Full not to follow.

## 2018-03-23 ENCOUNTER — Telehealth: Payer: Self-pay

## 2018-03-23 NOTE — Telephone Encounter (Signed)
Flagged on EMMI report for having questions about discharge papers and having other questions/problems.  Called and spoke with patient.  Patient expressed confusion on how she arrived at the ER recently as she does not remember and it's "bothering" her.  I reviewed notes, however couldn't find information, but relayed that it said she was hallucinating.  Patient was unsure who to follow up with regarding care.  Her discharge papers list [facility], however she has insurance coverage so doe not qualify for clinic services (free clinic).  Patient reports not having a PCP as her previous one no longer works at the practice she went to.  I asked if she wanted the number for [facility] or the [facility].  She mentioned she'd like the number for the [place] location so relayed that to her and encouraged her to call to inquire about setting up provider.

## 2018-04-01 ENCOUNTER — Emergency Department: Payer: Medicare Other

## 2018-04-01 ENCOUNTER — Other Ambulatory Visit: Payer: Self-pay

## 2018-04-01 ENCOUNTER — Inpatient Hospital Stay
Admission: EM | Admit: 2018-04-01 | Discharge: 2018-04-05 | DRG: 917 | Disposition: A | Discharge status: Other Acute Inpatient Hospital | Payer: Medicare Other | Attending: Internal Medicine | Admitting: Internal Medicine

## 2018-04-01 DIAGNOSIS — T50902A Poisoning by unspecified drugs, medicaments and biological substances, intentional self-harm, initial encounter: Secondary | ICD-10-CM

## 2018-04-01 DIAGNOSIS — T50912A Poisoning by multiple unspecified drugs, medicaments and biological substances, intentional self-harm, initial encounter: Secondary | ICD-10-CM | POA: Diagnosis present

## 2018-04-01 DIAGNOSIS — M6250 Muscle wasting and atrophy, not elsewhere classified, unspecified site: Secondary | ICD-10-CM | POA: Diagnosis present

## 2018-04-01 DIAGNOSIS — Z79890 Hormone replacement therapy (postmenopausal): Secondary | ICD-10-CM

## 2018-04-01 DIAGNOSIS — F418 Other specified anxiety disorders: Secondary | ICD-10-CM | POA: Diagnosis present

## 2018-04-01 DIAGNOSIS — B0229 Other postherpetic nervous system involvement: Secondary | ICD-10-CM | POA: Diagnosis present

## 2018-04-01 DIAGNOSIS — Z681 Body mass index (BMI) 19 or less, adult: Secondary | ICD-10-CM | POA: Diagnosis not present

## 2018-04-01 DIAGNOSIS — Z8249 Family history of ischemic heart disease and other diseases of the circulatory system: Secondary | ICD-10-CM

## 2018-04-01 DIAGNOSIS — Z885 Allergy status to narcotic agent status: Secondary | ICD-10-CM

## 2018-04-01 DIAGNOSIS — E43 Unspecified severe protein-calorie malnutrition: Secondary | ICD-10-CM | POA: Diagnosis present

## 2018-04-01 DIAGNOSIS — N183 Chronic kidney disease, stage 3 (moderate): Secondary | ICD-10-CM | POA: Diagnosis present

## 2018-04-01 DIAGNOSIS — Z915 Personal history of self-harm: Secondary | ICD-10-CM | POA: Diagnosis not present

## 2018-04-01 DIAGNOSIS — E876 Hypokalemia: Secondary | ICD-10-CM | POA: Diagnosis present

## 2018-04-01 DIAGNOSIS — Z79899 Other long term (current) drug therapy: Secondary | ICD-10-CM

## 2018-04-01 DIAGNOSIS — F411 Generalized anxiety disorder: Secondary | ICD-10-CM | POA: Diagnosis present

## 2018-04-01 DIAGNOSIS — E871 Hypo-osmolality and hyponatremia: Secondary | ICD-10-CM | POA: Diagnosis not present

## 2018-04-01 DIAGNOSIS — E872 Acidosis: Secondary | ICD-10-CM | POA: Diagnosis not present

## 2018-04-01 DIAGNOSIS — Z716 Tobacco abuse counseling: Secondary | ICD-10-CM | POA: Diagnosis not present

## 2018-04-01 DIAGNOSIS — D649 Anemia, unspecified: Secondary | ICD-10-CM | POA: Diagnosis present

## 2018-04-01 DIAGNOSIS — R296 Repeated falls: Secondary | ICD-10-CM | POA: Diagnosis present

## 2018-04-01 DIAGNOSIS — I440 Atrioventricular block, first degree: Secondary | ICD-10-CM | POA: Diagnosis present

## 2018-04-01 DIAGNOSIS — F333 Major depressive disorder, recurrent, severe with psychotic symptoms: Secondary | ICD-10-CM | POA: Diagnosis present

## 2018-04-01 DIAGNOSIS — T426X2A Poisoning by other antiepileptic and sedative-hypnotic drugs, intentional self-harm, initial encounter: Principal | ICD-10-CM | POA: Diagnosis present

## 2018-04-01 DIAGNOSIS — E039 Hypothyroidism, unspecified: Secondary | ICD-10-CM | POA: Diagnosis present

## 2018-04-01 DIAGNOSIS — Z8673 Personal history of transient ischemic attack (TIA), and cerebral infarction without residual deficits: Secondary | ICD-10-CM | POA: Diagnosis not present

## 2018-04-01 DIAGNOSIS — G92 Toxic encephalopathy: Secondary | ICD-10-CM | POA: Diagnosis present

## 2018-04-01 DIAGNOSIS — R402432 Glasgow coma scale score 3-8, at arrival to emergency department: Secondary | ICD-10-CM | POA: Diagnosis present

## 2018-04-01 DIAGNOSIS — Y92003 Bedroom of unspecified non-institutional (private) residence as the place of occurrence of the external cause: Secondary | ICD-10-CM

## 2018-04-01 DIAGNOSIS — T1491XA Suicide attempt, initial encounter: Secondary | ICD-10-CM

## 2018-04-01 DIAGNOSIS — F17210 Nicotine dependence, cigarettes, uncomplicated: Secondary | ICD-10-CM | POA: Diagnosis present

## 2018-04-01 DIAGNOSIS — R4189 Other symptoms and signs involving cognitive functions and awareness: Secondary | ICD-10-CM

## 2018-04-01 DIAGNOSIS — Z9181 History of falling: Secondary | ICD-10-CM | POA: Diagnosis not present

## 2018-04-01 DIAGNOSIS — K862 Cyst of pancreas: Secondary | ICD-10-CM | POA: Diagnosis present

## 2018-04-01 DIAGNOSIS — G9340 Encephalopathy, unspecified: Secondary | ICD-10-CM

## 2018-04-01 DIAGNOSIS — Z818 Family history of other mental and behavioral disorders: Secondary | ICD-10-CM

## 2018-04-01 DIAGNOSIS — Z9049 Acquired absence of other specified parts of digestive tract: Secondary | ICD-10-CM

## 2018-04-01 DIAGNOSIS — Z90710 Acquired absence of both cervix and uterus: Secondary | ICD-10-CM

## 2018-04-01 HISTORY — DX: Hypothyroidism, unspecified: E03.9

## 2018-04-01 HISTORY — DX: Depression, unspecified: F32.A

## 2018-04-01 HISTORY — DX: Major depressive disorder, single episode, unspecified: F32.9

## 2018-04-01 LAB — BLOOD GAS, VENOUS
ACID-BASE DEFICIT: 6.4 mmol/L — AB (ref 0.0–2.0)
BICARBONATE: 19.1 mmol/L — AB (ref 20.0–28.0)
FIO2: 100
Mechanical Rate: 16
O2 Saturation: 96.2 %
PEEP/CPAP: 5 cmH2O
PH VEN: 7.32 (ref 7.250–7.430)
Patient temperature: 37
RATE: 16 resp/min
VT: 500 mL
pCO2, Ven: 37 mmHg — ABNORMAL LOW (ref 44.0–60.0)
pO2, Ven: 90 mmHg — ABNORMAL HIGH (ref 32.0–45.0)

## 2018-04-01 LAB — URINE DRUG SCREEN, QUALITATIVE (ARMC ONLY)
AMPHETAMINES, UR SCREEN: NOT DETECTED
Benzodiazepine, Ur Scrn: POSITIVE — AB
Cannabinoid 50 Ng, Ur ~~LOC~~: NOT DETECTED
Cocaine Metabolite,Ur ~~LOC~~: NOT DETECTED
MDMA (Ecstasy)Ur Screen: NOT DETECTED
METHADONE SCREEN, URINE: NOT DETECTED
Opiate, Ur Screen: NOT DETECTED
Phencyclidine (PCP) Ur S: NOT DETECTED
TRICYCLIC, UR SCREEN: POSITIVE — AB

## 2018-04-01 LAB — COMPREHENSIVE METABOLIC PANEL
ALT: 8 U/L (ref 0–44)
ANION GAP: 5 (ref 5–15)
AST: 15 U/L (ref 15–41)
Albumin: 3.1 g/dL — ABNORMAL LOW (ref 3.5–5.0)
Alkaline Phosphatase: 56 U/L (ref 38–126)
BUN: 17 mg/dL (ref 8–23)
CHLORIDE: 113 mmol/L — AB (ref 98–111)
CO2: 19 mmol/L — AB (ref 22–32)
CREATININE: 0.93 mg/dL (ref 0.44–1.00)
Calcium: 8.1 mg/dL — ABNORMAL LOW (ref 8.9–10.3)
GFR calc non Af Amer: >60 mL/min (ref >60)
Glucose, Bld: 104 mg/dL — ABNORMAL HIGH (ref 70–99)
POTASSIUM: 3.3 mmol/L — AB (ref 3.5–5.1)
SODIUM: 137 mmol/L (ref 135–145)
Total Bilirubin: 0.3 mg/dL (ref 0.3–1.2)
Total Protein: 6.2 g/dL — ABNORMAL LOW (ref 6.5–8.1)

## 2018-04-01 LAB — CBC
HEMATOCRIT: 26.7 % — AB (ref 35.0–47.0)
HEMOGLOBIN: 8.8 g/dL — AB (ref 12.0–16.0)
MCH: 27.7 pg (ref 26.0–34.0)
MCHC: 32.9 g/dL (ref 32.0–36.0)
MCV: 84.2 fL (ref 80.0–100.0)
Platelets: 254 10*3/uL (ref 150–440)
RBC: 3.17 MIL/uL — ABNORMAL LOW (ref 3.80–5.20)
RDW: 18 % — AB (ref 11.5–14.5)
WBC: 6.3 10*3/uL (ref 3.6–11.0)

## 2018-04-01 LAB — LACTIC ACID, PLASMA: LACTIC ACID, VENOUS: 0.7 mmol/L (ref 0.5–1.9)

## 2018-04-01 LAB — ETHANOL

## 2018-04-01 LAB — SALICYLATE LEVEL

## 2018-04-01 LAB — ACETAMINOPHEN LEVEL: Acetaminophen (Tylenol), Serum: <10 ug/mL — ABNORMAL LOW (ref 10–30)

## 2018-04-01 MED ORDER — FENTANYL 2500MCG IN NS 250ML (10MCG/ML) PREMIX INFUSION
20.0000 ug/h | INTRAVENOUS | Status: DC
  Administered 2018-04-01: 20 ug/h via INTRAVENOUS
  Filled 2018-04-01: qty 250

## 2018-04-01 MED ORDER — MIDAZOLAM HCL 5 MG/5ML IJ SOLN
INTRAMUSCULAR | Status: AC
  Filled 2018-04-01: qty 5

## 2018-04-01 MED ORDER — SODIUM CHLORIDE 0.9 % IV SOLN
0.5000 mg/h | INTRAVENOUS | Status: DC
  Administered 2018-04-01: 0.5 mg/h via INTRAVENOUS
  Filled 2018-04-01: qty 10

## 2018-04-01 MED ORDER — SUCCINYLCHOLINE CHLORIDE 20 MG/ML IJ SOLN
100.0000 mg | Freq: Once | INTRAMUSCULAR | Status: AC
  Administered 2018-04-01: 100 mg via INTRAVENOUS

## 2018-04-01 MED ORDER — NALOXONE HCL 2 MG/2ML IJ SOSY
PREFILLED_SYRINGE | INTRAMUSCULAR | Status: AC
  Filled 2018-04-01: qty 4

## 2018-04-01 MED ORDER — ETOMIDATE 2 MG/ML IV SOLN
0.4000 mg/kg | Freq: Once | INTRAVENOUS | Status: AC
  Administered 2018-04-01: 20 mg via INTRAVENOUS

## 2018-04-01 MED ORDER — SODIUM CHLORIDE 0.9 % IV BOLUS
1000.0000 mL | Freq: Once | INTRAVENOUS | Status: AC
  Administered 2018-04-01: 1000 mL via INTRAVENOUS

## 2018-04-01 MED ORDER — FENTANYL CITRATE (PF) 100 MCG/2ML IJ SOLN
INTRAMUSCULAR | Status: AC
  Filled 2018-04-01: qty 2

## 2018-04-01 MED ORDER — NALOXONE HCL 2 MG/2ML IJ SOSY
4.0000 mg | PREFILLED_SYRINGE | Freq: Once | INTRAMUSCULAR | Status: AC
  Administered 2018-04-01: 4 mg via INTRAVENOUS

## 2018-04-01 MED ORDER — FENTANYL CITRATE (PF) 100 MCG/2ML IJ SOLN
25.0000 ug | Freq: Once | INTRAMUSCULAR | Status: AC
  Administered 2018-04-01: 25 ug via INTRAVENOUS

## 2018-04-01 MED ORDER — MIDAZOLAM HCL 5 MG/5ML IJ SOLN
4.0000 mg | Freq: Once | INTRAMUSCULAR | Status: AC
  Administered 2018-04-01: 4 mg via INTRAVENOUS

## 2018-04-01 NOTE — H&P (Signed)
[facility] [place] NAME: Diana West    MR#:  [id number]  DATE OF BIRTH:  [birth year]  DATE OF ADMISSION:  04/01/2018  PRIMARY CARE PHYSICIAN: Patient, No Pcp Per   REQUESTING/REFERRING PHYSICIAN: Mariea Clonts, MD  CHIEF COMPLAINT:   Chief Complaint  Patient presents with  . Drug Overdose    HISTORY OF PRESENT ILLNESS:  Diana West  is a 62 y.o. female who presents with intentional overdose.  Patient called her son and told him that she was taking an overdose of gabapentin and amitriptyline.  When she was found unresponsive at home she had gabapentin, amitriptyline, and Zoloft bottles beside her.  She was brought to the ED for evaluation and was stable in terms of vital signs, but nonresponsive and per ED physician did not even have a gag reflex.  She was intubated for airway protection.  Hospitalist were called for admission  PAST MEDICAL HISTORY:   Past Medical History:  Diagnosis Date  . Anxiety   . Anxiety   . Arthritis   . Cardiac arrhythmia   . Depression   . Falls frequently   . Hypothyroidism   . Palpitation   . Shingles    she states "they are inside and not contagious"  . TIA (transient ischemic attack)      PAST SURGICAL HISTORY:   Past Surgical History:  Procedure Laterality Date  . ABDOMINAL HYSTERECTOMY    . CESAREAN SECTION    . CHOLECYSTECTOMY       SOCIAL HISTORY:   Social History   Tobacco Use  . Smoking status: Current Some Day Smoker    Packs/day: 0.25    Types: Cigarettes  . Smokeless tobacco: Never Used  Substance Use Topics  . Alcohol use: No     FAMILY HISTORY:   Family History  Problem Relation Age of Onset  . CAD Mother   . CAD Father      DRUG ALLERGIES:   Allergies  Allergen Reactions  . Codeine Itching and Rash  . Toradol [Ketorolac Tromethamine] Rash    MEDICATIONS AT HOME:   Prior to Admission medications   Medication Sig Start Date End Date Taking?  Authorizing Provider  ALPRAZolam Duanne Moron) 0.5 MG tablet Take 0.5 mg by mouth 3 (three) times daily as needed for anxiety.   Yes [provider]  amitriptyline (ELAVIL) 150 MG tablet Take 150 mg by mouth at bedtime.   Yes [provider]  gabapentin (NEURONTIN) 600 MG tablet Take 600 mg by mouth 3 (three) times daily.   Yes [provider]  levothyroxine (SYNTHROID, LEVOTHROID) 125 MCG tablet Take 125 mcg by mouth daily before breakfast.   Yes [provider]  sertraline (ZOLOFT) 100 MG tablet Take 1 tablet (100 mg total) by mouth 2 (two) times daily. 03/17/18  Yes PyreddyReatha Harps, MD    REVIEW OF SYSTEMS:  Review of Systems  Unable to perform ROS: Acuity of condition     VITAL SIGNS:   Vitals:   04/01/18 2230 04/01/18 2245 04/01/18 2315 04/01/18 2329  BP: 140/71 (!) 156/83 (!) 146/75   Pulse: 81 86 80 78  Resp: 20 11 (!) 21 (!) 22  SpO2: 100% 100% 100% 100%  Weight:      Height:       Wt Readings from Last 3 Encounters:  04/01/18 49.9 kg (110 lb)  03/16/18 48.9 kg (107 lb 11.2 oz)  02/14/18 49.1 kg (108 lb 3.2 oz)  PHYSICAL EXAMINATION:  Physical Exam  Vitals reviewed. Constitutional: She appears well-developed and well-nourished. No distress.  HENT:  Head: Normocephalic and atraumatic.  Mouth/Throat: Oropharynx is clear and moist.  Eyes: Pupils are equal, round, and reactive to light. Conjunctivae and EOM are normal. No scleral icterus.  Neck: Normal range of motion. Neck supple. No JVD present. No thyromegaly present.  Cardiovascular: Normal rate, regular rhythm and intact distal pulses. Exam reveals no gallop and no friction rub.  No murmur heard. Respiratory: Effort normal and breath sounds normal. No respiratory distress. She has no wheezes. She has no rales.  Intubated  GI: Soft. Bowel sounds are normal. She exhibits no distension. There is no tenderness.  Musculoskeletal: Normal range of motion. She exhibits no edema.  No  arthritis, no gout  Lymphadenopathy:    She has no cervical adenopathy.  Neurological:  Unable to assess as patient is sedated, intubated  Skin: Skin is warm and dry. No rash noted. No erythema.  Psychiatric:  Unable to assess due to patient condition    LABORATORY PANEL:   CBC Recent Labs  Lab 04/01/18 2147  WBC 6.3  HGB 8.8*  HCT 26.7*  PLT 254   ------------------------------------------------------------------------------------------------------------------  Chemistries  Recent Labs  Lab 04/01/18 2147  NA 137  K 3.3*  CL 113*  CO2 19*  GLUCOSE 104*  BUN 17  CREATININE 0.93  CALCIUM 8.1*  AST 15  ALT 8  ALKPHOS 56  BILITOT 0.3   ------------------------------------------------------------------------------------------------------------------  Cardiac Enzymes No results for input(s): TROPONINI in the last 168 hours. ------------------------------------------------------------------------------------------------------------------  RADIOLOGY:  Ct Head Wo Contrast  Result Date: 04/01/2018 CLINICAL DATA:  Altered level of consciousness, medication overdose. EXAM: CT HEAD WITHOUT CONTRAST TECHNIQUE: Contiguous axial images were obtained from the base of the skull through the vertex without intravenous contrast. COMPARISON:  03/14/2018 FINDINGS: Brain: Involutional changes of the brain, age related without acute intracranial hemorrhage, midline shift or edema. No large vascular territory infarct. Chronic minimal small vessel ischemic disease of periventricular white matter. Midline fourth ventricle and basal cisterns without effacement. No hydrocephalus. Cerebellum and brainstem are unremarkable. No intra-axial mass nor extra-axial collections. Vascular: No hyperdense vessel sign. Skull: Intact Sinuses/Orbits: Nonacute Other: None IMPRESSION: Age related mild involutional changes of the brain with chronic small vessel ischemic disease. No acute intracranial abnormality.  Electronically Signed   By: Ashley Royalty M.D.   On: 04/01/2018 23:32   Dg Chest Portable 1 View  Result Date: 04/01/2018 CLINICAL DATA:  Intubated EXAM: PORTABLE CHEST 1 VIEW COMPARISON:  02/12/2018 chest radiograph. FINDINGS: Endotracheal tube tip is 2.4 cm above the carina. Enteric tube terminates in the proximal stomach. Stable cardiomediastinal silhouette with normal heart size. No pneumothorax. No pleural effusion. Lungs appear clear, with no acute consolidative airspace disease and no pulmonary edema. Cholecystectomy clips are seen in the right upper quadrant of the abdomen. IMPRESSION: 1. Well-positioned endotracheal tube. Enteric tube terminates in the proximal stomach. 2. No active cardiopulmonary disease. Electronically Signed   By: Ilona Sorrel M.D.   On: 04/01/2018 22:29    EKG:   Orders placed or performed during the hospital encounter of 03/14/18  . ED EKG  . ED EKG  . EKG 12-Lead  . EKG 12-Lead  . ED EKG  . ED EKG  . EKG    IMPRESSION AND PLAN:  Principal Problem:   Suicide attempt by multiple drug overdose ([place]) -patient is intubated due to respiratory compromise from overdose.  Patient called her son told him  that she was overdosing.  We will get a psychiatry consult, admit the patient ICU with intensivist support. Active Problems:   Depression with anxiety -holding all home meds at this time, psych consult as above   Hypothyroidism -continue home dose thyroid replacement with the patient is able to take p.o.  Chart review performed and case discussed with ED provider. Labs, imaging and/or ECG reviewed by provider and discussed with patient/family. Management plans discussed with the patient and/or family.  DVT PROPHYLAXIS: SubQ lovenox  GI PROPHYLAXIS: H2 blocker  ADMISSION STATUS: Inpatient  CODE STATUS: Full Code Status History    Date Active Date Inactive Code Status Order ID Comments User Context   03/16/2018 0512 03/17/2018 2223 Full Code [id number]   Arta Silence, MD Inpatient   02/14/2018 0028 02/14/2018 1935 Full Code [id number]  Amelia Jo, MD Inpatient   07/22/2017 1353 07/24/2017 1733 Full Code [id number]  Gladstone Lighter, MD Inpatient      TOTAL CRITICAL CARE TIME TAKING CARE OF THIS PATIENT: 50 minutes.   Kaci Dillie Bailey 04/01/2018, 11:46 PM  [facility]  Office  [phone]  CC: Primary care physician; Patient, No Pcp Per  Note:  This document was prepared using Dragon voice recognition software and may include unintentional dictation errors.

## 2018-04-01 NOTE — ED Notes (Signed)
Pt to the er for an intentional overdose of amitriptyline 150mg , gabapentin 600mg , and sertraline 100mg . Exact amount is unknown. Fire dept first on scene and gave Narcan with no improvement. Pt is breathing on her own but does not respond to pain. Pt has made suicide attempts in the past. Per son, pt was recently diagnosed with pancreatic cancer.

## 2018-04-01 NOTE — ED Triage Notes (Signed)
Pt to the er for overdose.

## 2018-04-01 NOTE — Consult Note (Signed)
Name: Diana West MRN: [id number] DOB: December 12, 1955    ADMISSION DATE:  04/01/2018 CONSULTATION DATE: 04/01/2018  REFERRING MD : Dr. Jannifer Franklin   CHIEF COMPLAINT: Intentional Drug Overdose  BRIEF PATIENT DESCRIPTION:  62 yo female admitted with intentional drug overdose of amitriptyline, gabapentin, and sertraline requiring mechanical intubation for airway protection  SIGNIFICANT EVENTS/STUDIES:  06/26 Pt admitted to ICU mechanically intubated 06/26 CT Head showed Age related mild involutional changes of the brain with chronic small vessel ischemic disease. No acute intracranial abnormality.  HISTORY OF PRESENT ILLNESS:   This is a 62 yo female with a PMH of TIA's, Shingles, Palpitations, Suicide Ideation and Attempts, Frequent Falls, Psychosis, Pancreatic Cyst (managed by [facility] she saw surgical oncologist on 12/26/2017, and at that time surgical resection not indicated surgeon felt cyst was not malignant plans for serial imaging), Arthritis, Major Depressive Disorder, and Anxiety.  She presented to [facility] ER on 06/26 from home following an intentional drug overdose.  Per ER notes the pt took unknown amounts of amitriptyline, gabapentin, and sertraline.  The fire department arrived on the scene first pt unresponsive, fire department administered narcan with no improvement in mentation. Upon arrival to the ER she received an additional 4 mg of iv narcan without improvement, therefore she was mechanically intubated for airway protection.  Lab results revealed salicylate level <6.5, acetaminophen <10, alcohol ethyl <10, and urine drug screen positive for benzos and tricyclic.  Poison control contacted by ER.  She was subsequently admitted to the ICU by hospitalist team for further workup and treatment.  PAST MEDICAL HISTORY :   has a past medical history of Anxiety, Arthritis, Cardiac arrhythmia, Falls frequently, Palpitation, Shingles, and TIA (transient ischemic attack).  has a past surgical  history that includes Abdominal hysterectomy; Cholecystectomy; and Cesarean section. Prior to Admission medications   Medication Sig Start Date End Date Taking? Authorizing Provider  ALPRAZolam Duanne Moron) 0.5 MG tablet Take 0.5 mg by mouth 3 (three) times daily as needed for anxiety.   Yes [provider]  amitriptyline (ELAVIL) 150 MG tablet Take 150 mg by mouth at bedtime.   Yes [provider]  gabapentin (NEURONTIN) 600 MG tablet Take 600 mg by mouth 3 (three) times daily.   Yes [provider]  levothyroxine (SYNTHROID, LEVOTHROID) 125 MCG tablet Take 125 mcg by mouth daily before breakfast.   Yes [provider]  sertraline (ZOLOFT) 100 MG tablet Take 1 tablet (100 mg total) by mouth 2 (two) times daily. 03/17/18  Yes Saundra Shelling, MD   Allergies  Allergen Reactions  . Codeine Itching and Rash  . Toradol [Ketorolac Tromethamine] Rash    FAMILY HISTORY:  family history includes CAD in her father and mother. SOCIAL HISTORY:  reports that she has been smoking cigarettes.  She has been smoking about 0.25 packs per day. She has never used smokeless tobacco. She reports that she does not drink alcohol or use drugs.  REVIEW OF SYSTEMS:   Unable to assess pt intubated   SUBJECTIVE:  Unable to assess pt intubated   VITAL SIGNS: Pulse Rate:  [80-87] 81 (06/26 2230) Resp:  [16-21] 20 (06/26 2230) BP: (107-140)/(58-71) 140/71 (06/26 2230) SpO2:  [98 %-100 %] 100 % (06/26 2230) FiO2 (%):  [100 %] 100 % (06/26 2145) Weight:  [49.9 kg (110 lb)] 49.9 kg (110 lb) (06/26 2136)  PHYSICAL EXAMINATION: General: acutely ill appearing female, NAD mechanically intubated  Neuro: unresponsive, PERRL  HEENT: supple, no JVD  Cardiovascular: nsr, rrr, no  R/G Lungs: diminished throughout, even, non labored Abdomen: +BS x4, soft, non distended  Musculoskeletal: normal bulk and tone, no edema  Skin: intact no rashes or lesions   Recent Labs  Lab 04/01/18 2147    NA 137  K 3.3*  CL 113*  CO2 19*  BUN 17  CREATININE 0.93  GLUCOSE 104*   Recent Labs  Lab 04/01/18 2147  HGB 8.8*  HCT 26.7*  WBC 6.3  PLT 254   Dg Chest Portable 1 View  Result Date: 04/01/2018 CLINICAL DATA:  Intubated EXAM: PORTABLE CHEST 1 VIEW COMPARISON:  02/12/2018 chest radiograph. FINDINGS: Endotracheal tube tip is 2.4 cm above the carina. Enteric tube terminates in the proximal stomach. Stable cardiomediastinal silhouette with normal heart size. No pneumothorax. No pleural effusion. Lungs appear clear, with no acute consolidative airspace disease and no pulmonary edema. Cholecystectomy clips are seen in the right upper quadrant of the abdomen. IMPRESSION: 1. Well-positioned endotracheal tube. Enteric tube terminates in the proximal stomach. 2. No active cardiopulmonary disease. Electronically Signed   By: Ilona Sorrel M.D.   On: 04/01/2018 22:29    ASSESSMENT / PLAN: Intentional Drug Overdose  Mechanical Intubation  Hypokalemia  Anemia without acute blood loss Hx: Hypothyroidism, Major Depressive Disorder, Anxiety, Suicidal Ideation and Attempts, and Cardiac Arrhythmias P: Full vent support for now-vent settings reviewed and established SBT once all parameters met VAP bundle implemented Continuous telemetry monitoring  Check troponin  Repeat EKG  Trend CMP Replace electrolytes as indicated  Monitor UOP  VTE px: subq lovenox  Trend CBC  Monitor for s/sx of bleeding and transfuse for hgb <7 SUP px: IV pepcid  Keep NPO for now if pt remains intubated in next 24 hrs will start TF's Maintain RASS goal 0 to -1 Propofol gtt and prn fentanyl to maintain RASS goal  WUA daily Seizure precautions  Prn ativan for seizure activity  Once extubated will need sitter at bedside and psychiatry consult   Marda Stalker, [place] Pager [phone] (please enter 7 digits) PCCM Consult Pager [phone] (please enter 7 digits)

## 2018-04-01 NOTE — ED Notes (Addendum)
Per Poison control recommendation by Ebony Hail, the patient took unknown amounts of sertraline 100 mg, gabapentin 600 mg, and amitriptyline 150 mg.  Due to the ingestion of these medications, we should be monitoring for seizure like activity, decreased responsiveness, QRS widening, and QTC prolongation.  Sodium bicarb boluses should be administered if there is QRS widening and if there is QTC prolongation then we should check for potassium and magnesium levels and replace to the higher limits of normal.  EKG and labs should also be completed on this patient.  MD and assigned RN made aware.

## 2018-04-01 NOTE — ED Provider Notes (Addendum)
[facility] Emergency Department Provider Note  ____________________________________________  Time seen: Approximately 9:49 PM  I have reviewed the triage vital signs and the nursing notes.   HISTORY  Chief Complaint Drug Overdose    HPI Adileny Delon is a 62 y.o. female with a history of depression and anxiety, TIA, brought by EMS for presumed medication overdose with suicide intent.  The patient is unresponsive and unable to give any history or participate in physical examination.  I did speak with the patient's son, who reports that at 7 PM he received a "suicide voicemail."  He reports that his mother left him a message stating "I cannot live anymore".  He called her back at 7:40 PM and she admitted to taking gabapentin and amitriptyline.  He drove to her home, arriving at 8 PM, and by then she was unresponsive.  EMS arrived and the patient was unresponsive, without any gag, but did have reassuring vital signs with a normal blood pressure, normal heart rate, normal O2 sats as well as a normal respiratory rate.  The fire department had given the patient 2 mg of Narcan, without any response.  An additional empty bottle of sertraline was found at the bedside.  Past Medical History:  Diagnosis Date  . Anxiety   . Anxiety   . Arthritis   . Cardiac arrhythmia   . Depression   . Falls frequently   . Hypothyroidism   . Palpitation   . Shingles    she states "they are inside and not contagious"  . TIA (transient ischemic attack)     Patient Active Problem List   Diagnosis Date Noted  . Suicide attempt by multiple drug overdose ([place]) 04/01/2018  . Hypothyroidism 04/01/2018  . Acute encephalopathy 03/17/2018  . Recurrent moderate major depressive disorder with anxiety ([place]) 03/16/2018    Class: Acute  . Pressure injury of skin 03/16/2018  . Metabolic encephalopathy 54/49/2010  . Malnutrition of moderate degree 03/16/2018  . Acute UTI 02/13/2018   . Pancreatic cyst 11/12/2017  . Protein-calorie malnutrition, severe 07/23/2017  . Acute cystitis 07/22/2017  . TIA (transient ischemic attack) 07/06/2017  . Hypotension 06/26/2017  . Falls frequently 06/26/2017  . Anemia 06/26/2017  . Depression with anxiety 06/26/2017  . FH: premature coronary heart disease 06/26/2017  . Lumbar disc disease 06/26/2017  . Cervical disc disease 06/26/2017  . Chronic headaches 06/26/2017  . History of PSVT (paroxysmal supraventricular tachycardia) 06/26/2017  . Tinea corporis 06/26/2017  . Postherpetic neuralgia 06/26/2017  . CKD (chronic kidney disease) stage 3, GFR 30-59 ml/min (HCC) 06/26/2017    Past Surgical History:  Procedure Laterality Date  . ABDOMINAL HYSTERECTOMY    . CESAREAN SECTION    . CHOLECYSTECTOMY      Current Outpatient Rx  . Order #: [id number] Class: Historical Med  . Order #: [id number] Class: Historical Med  . Order #: [id number] Class: Historical Med  . Order #: [id number] Class: Historical Med  . Order #: [id number] Class: Normal    Allergies Codeine and Toradol [ketorolac tromethamine]  Family History  Problem Relation Age of Onset  . CAD Mother   . CAD Father     Social History Social History   Tobacco Use  . Smoking status: Current Some Day Smoker    Packs/day: 0.25    Types: Cigarettes  . Smokeless tobacco: Never Used  Substance Use Topics  . Alcohol use: No  . Drug use: No    Review of Systems Unable to obtain due to patient unresponsiveness.  ____________________________________________   PHYSICAL EXAM:  VITAL SIGNS: ED Triage Vitals  Enc Vitals Group     BP 04/01/18 2135 (!) 107/58     Pulse Rate 04/01/18 2135 87     Resp 04/01/18 2135 20     Temp --      Temp src --      SpO2 04/01/18 2135 98 %     Weight 04/01/18 2136 110 lb (49.9 kg)     Height 04/01/18 2136 5\' 6"  (1.676 m)     Head Circumference --      Peak Flow --      Pain Score 04/01/18 2136 0     Pain Loc --      Pain Edu?  --      Excl. in [place]? --     Constitutional: The patient is unresponsive to verbal stimulus, painful stimulus, and does not have a gag.  GCS is 3. Eyes: Conjunctivae are normal.  . pupils are 2 mm bilaterally and fixed.  No scleral icterus. Head: Atraumatic. Nose: No congestion/rhinnorhea. Mouth/Throat: Mucous membranes are dry.  Neck: No stridor.  Supple.  No meningismus. Cardiovascular: Normal rate, regular rhythm. No murmurs, rubs or gallops.  Respiratory: Normal respiratory effort.  No accessory muscle use or retractions. Lungs CTAB.  No wheezes, rales or ronchi. Gastrointestinal: Soft, nontender and nondistended.  No guarding or rebound.  No peritoneal signs. Musculoskeletal: No LE edema. Neurologic:  Unresponsive. Skin:  Skin is warm, dry and intact. No rash noted. Psychiatric: Unable to assess due to unresponsiveness. ____________________________________________   LABS (all labs ordered are listed, but only abnormal results are displayed)  Labs Reviewed  BLOOD GAS, VENOUS - Abnormal; Notable for the following components:      Result Value   pCO2, Ven 37 (*)    pO2, Ven 90.0 (*)    Bicarbonate 19.1 (*)    Acid-base deficit 6.4 (*)    All other components within normal limits  CBC - Abnormal; Notable for the following components:   RBC 3.17 (*)    Hemoglobin 8.8 (*)    HCT 26.7 (*)    RDW 18.0 (*)    All other components within normal limits  COMPREHENSIVE METABOLIC PANEL - Abnormal; Notable for the following components:   Potassium 3.3 (*)    Chloride 113 (*)    CO2 19 (*)    Glucose, Bld 104 (*)    Calcium 8.1 (*)    Total Protein 6.2 (*)    Albumin 3.1 (*)    All other components within normal limits  URINE DRUG SCREEN, QUALITATIVE ([facility] ONLY) - Abnormal; Notable for the following components:   Tricyclic, Ur Screen POSITIVE (*)    Barbiturates, Ur Screen   (*)    Value: Result not available. Reagent lot number recalled by manufacturer.   Benzodiazepine, Ur Scrn  POSITIVE (*)    All other components within normal limits  ACETAMINOPHEN LEVEL - Abnormal; Notable for the following components:   Acetaminophen (Tylenol), Serum <10 (*)    All other components within normal limits  CULTURE, BLOOD (ROUTINE X 2)  CULTURE, BLOOD (ROUTINE X 2)  MRSA PCR SCREENING  LACTIC ACID, PLASMA  ETHANOL  SALICYLATE LEVEL  LACTIC ACID, PLASMA  URINALYSIS, COMPLETE (UACMP) WITH MICROSCOPIC  AMMONIA  MAGNESIUM   ____________________________________________  EKG  ED ECG REPORT I, Eula Listen, the attending physician, personally viewed and interpreted this ECG.   Date: 04/01/2018  EKG Time: 2254  Rate: 84  Rhythm:  normal sinus rhythm  Axis: normal  Intervals:first-degree A-V block   ST&T Change: Specific T wave inversions in V1 and V2.  No STEMI.  ____________________________________________  RADIOLOGY  Ct Head Wo Contrast  Result Date: 04/01/2018 CLINICAL DATA:  Altered level of consciousness, medication overdose. EXAM: CT HEAD WITHOUT CONTRAST TECHNIQUE: Contiguous axial images were obtained from the base of the skull through the vertex without intravenous contrast. COMPARISON:  03/14/2018 FINDINGS: Brain: Involutional changes of the brain, age related without acute intracranial hemorrhage, midline shift or edema. No large vascular territory infarct. Chronic minimal small vessel ischemic disease of periventricular white matter. Midline fourth ventricle and basal cisterns without effacement. No hydrocephalus. Cerebellum and brainstem are unremarkable. No intra-axial mass nor extra-axial collections. Vascular: No hyperdense vessel sign. Skull: Intact Sinuses/Orbits: Nonacute Other: None IMPRESSION: Age related mild involutional changes of the brain with chronic small vessel ischemic disease. No acute intracranial abnormality. Electronically Signed   By: Ashley Royalty M.D.   On: 04/01/2018 23:32   Dg Chest Portable 1 View  Result Date:  04/01/2018 CLINICAL DATA:  Intubated EXAM: PORTABLE CHEST 1 VIEW COMPARISON:  02/12/2018 chest radiograph. FINDINGS: Endotracheal tube tip is 2.4 cm above the carina. Enteric tube terminates in the proximal stomach. Stable cardiomediastinal silhouette with normal heart size. No pneumothorax. No pleural effusion. Lungs appear clear, with no acute consolidative airspace disease and no pulmonary edema. Cholecystectomy clips are seen in the right upper quadrant of the abdomen. IMPRESSION: 1. Well-positioned endotracheal tube. Enteric tube terminates in the proximal stomach. 2. No active cardiopulmonary disease. Electronically Signed   By: Ilona Sorrel M.D.   On: 04/01/2018 22:29    ____________________________________________   PROCEDURES  Procedure(s) performed: None  Procedures  Critical Care performed: Yes, see critical care note(s) ____________________________________________   INITIAL IMPRESSION / ASSESSMENT AND PLAN / ED COURSE  Pertinent labs & imaging results that were available during my care of the patient were reviewed by me and considered in my medical decision making (see chart for details).  62 y.o. female brought by EMS for overdose with unresponsiveness.  Upon arrival to the emergency department, the patient was not protecting her airway.  She was given 4 mg of Narcan, and again did not have any response.  The suspicion that she was overdosing on opiates was very low. She was immediately given medications for RSI, and intubated without any difficulty.  She maintain O2 sats in the high 90s for the entirety of her intubation.  I did speak with the patient's son, to update him on her critical condition.  She was admitted to the hospitalist for ICU care.  Poison control was contacted and their recommendations were listed in the nursing section.   INTUBATION Performed by: Eula Listen  Required items: required blood products, implants, devices, and special equipment  available Patient identity confirmed: provided demographic data and hospital-assigned identification number Time out: Immediately prior to procedure a "time out" was called to verify the correct patient, procedure, equipment, support staff and site/side marked as required.  Indications: GCS 3   Intubation method: Direct Laryngoscopy   Preoxygenation: BVM  Sedatives: 20mg  Etomidate Paralytic: 100mg  Succinylcholine  Tube Size: 7.5 cuffed  Post-procedure assessment: chest rise and ETCO2 monitor Breath sounds: equal and absent over the epigastrium Tube secured with: ETT holder Chest x-ray interpreted by radiologist and me.  Chest x-ray findings: 7.5 endotracheal tube in appropriate position  Patient tolerated the procedure well with no immediate complications.      CRITICAL CARE Performed  by: Mariea Clonts, Anne-Caroline   Total critical care time: 60 minutes  Critical care time was exclusive of separately billable procedures and treating other patients.  Critical care was necessary to treat or prevent imminent or life-threatening deterioration.  Critical care was time spent personally by me on the following activities: development of treatment plan with patient and/or surrogate as well as nursing, discussions with consultants, evaluation of patient's response to treatment, examination of patient, obtaining history from patient or surrogate, ordering and performing treatments and interventions, ordering and review of laboratory studies, ordering and review of radiographic studies, pulse oximetry and re-evaluation of patient's condition.  ____________________________________________  FINAL CLINICAL IMPRESSION(S) / ED DIAGNOSES  Final diagnoses:  Intentional drug overdose, initial encounter ([place])  Suicide attempt ([place])  Unresponsiveness         NEW MEDICATIONS STARTED DURING THIS VISIT:  New Prescriptions   No medications on file      Eula Listen,  MD 04/02/18 0024    Eula Listen, MD 04/02/18 [phone]

## 2018-04-01 NOTE — Progress Notes (Signed)
CT and back to room 4 without any incident

## 2018-04-02 ENCOUNTER — Inpatient Hospital Stay: Payer: Medicare Other

## 2018-04-02 DIAGNOSIS — T50902A Poisoning by unspecified drugs, medicaments and biological substances, intentional self-harm, initial encounter: Secondary | ICD-10-CM

## 2018-04-02 LAB — BLOOD GAS, ARTERIAL
ACID-BASE DEFICIT: 7.1 mmol/L — AB (ref 0.0–2.0)
Bicarbonate: 17.5 mmol/L — ABNORMAL LOW (ref 20.0–28.0)
FIO2: 70
MECHVT: 500 mL
Mechanical Rate: 16
O2 Saturation: 99.9 %
PEEP/CPAP: 5 cmH2O
PH ART: 7.38 (ref 7.350–7.450)
Patient temperature: 35.5
pCO2 arterial: 31 mmHg — ABNORMAL LOW (ref 32.0–48.0)
pO2, Arterial: 333 mmHg — ABNORMAL HIGH (ref 83.0–108.0)

## 2018-04-02 LAB — URINALYSIS, COMPLETE (UACMP) WITH MICROSCOPIC
BILIRUBIN URINE: NEGATIVE
Bacteria, UA: NONE SEEN
Glucose, UA: NEGATIVE mg/dL
HGB URINE DIPSTICK: NEGATIVE
KETONES UR: NEGATIVE mg/dL
LEUKOCYTES UA: NEGATIVE
NITRITE: NEGATIVE
Protein, ur: NEGATIVE mg/dL
SPECIFIC GRAVITY, URINE: 1.019 (ref 1.005–1.030)
Squamous Epithelial / LPF: NONE SEEN (ref 0–5)
pH: 6 (ref 5.0–8.0)

## 2018-04-02 LAB — BASIC METABOLIC PANEL
ANION GAP: 6 (ref 5–15)
BUN: 17 mg/dL (ref 8–23)
CALCIUM: 8.2 mg/dL — AB (ref 8.9–10.3)
CO2: 17 mmol/L — ABNORMAL LOW (ref 22–32)
Chloride: 111 mmol/L (ref 98–111)
Creatinine, Ser: 0.65 mg/dL (ref 0.44–1.00)
GFR calc Af Amer: >60 mL/min (ref >60)
Glucose, Bld: 111 mg/dL — ABNORMAL HIGH (ref 70–99)
POTASSIUM: 3.9 mmol/L (ref 3.5–5.1)
SODIUM: 134 mmol/L — AB (ref 135–145)

## 2018-04-02 LAB — CBC
HCT: 28 % — ABNORMAL LOW (ref 35.0–47.0)
Hemoglobin: 9.1 g/dL — ABNORMAL LOW (ref 12.0–16.0)
MCH: 27.3 pg (ref 26.0–34.0)
MCHC: 32.4 g/dL (ref 32.0–36.0)
MCV: 84.3 fL (ref 80.0–100.0)
PLATELETS: 241 10*3/uL (ref 150–440)
RBC: 3.32 MIL/uL — ABNORMAL LOW (ref 3.80–5.20)
RDW: 17.8 % — AB (ref 11.5–14.5)
WBC: 7.2 10*3/uL (ref 3.6–11.0)

## 2018-04-02 LAB — TROPONIN I: Troponin I: <0.03 ng/mL (ref <0.03)

## 2018-04-02 LAB — AMMONIA: AMMONIA: 27 umol/L (ref 9–35)

## 2018-04-02 LAB — GLUCOSE, CAPILLARY
GLUCOSE-CAPILLARY: 91 mg/dL (ref 70–99)
GLUCOSE-CAPILLARY: 97 mg/dL (ref 70–99)
Glucose-Capillary: 105 mg/dL — ABNORMAL HIGH (ref 70–99)
Glucose-Capillary: 127 mg/dL — ABNORMAL HIGH (ref 70–99)
Glucose-Capillary: 111 mg/dL — ABNORMAL HIGH (ref 70–99)

## 2018-04-02 LAB — MRSA PCR SCREENING: MRSA by PCR: POSITIVE — AB

## 2018-04-02 LAB — LACTIC ACID, PLASMA: Lactic Acid, Venous: 0.7 mmol/L (ref 0.5–1.9)

## 2018-04-02 LAB — TRIGLYCERIDES: Triglycerides: 112 mg/dL (ref <150)

## 2018-04-02 LAB — PHOSPHORUS: Phosphorus: 3.4 mg/dL (ref 2.5–4.6)

## 2018-04-02 LAB — MAGNESIUM: Magnesium: 1.8 mg/dL (ref 1.7–2.4)

## 2018-04-02 MED ORDER — FAMOTIDINE IN NACL 20-0.9 MG/50ML-% IV SOLN
20.0000 mg | Freq: Two times a day (BID) | INTRAVENOUS | Status: DC
  Administered 2018-04-02: 20 mg via INTRAVENOUS
  Filled 2018-04-02: qty 50

## 2018-04-02 MED ORDER — LORAZEPAM 2 MG/ML IJ SOLN
1.0000 mg | INTRAMUSCULAR | Status: DC | PRN
  Administered 2018-04-03: 1 mg via INTRAVENOUS
  Administered 2018-04-03 – 2018-04-04 (×2): 2 mg via INTRAVENOUS
  Administered 2018-04-04: 1 mg via INTRAVENOUS
  Filled 2018-04-02 (×4): qty 1

## 2018-04-02 MED ORDER — MAGNESIUM SULFATE 2 GM/50ML IV SOLN
2.0000 g | Freq: Once | INTRAVENOUS | Status: AC
  Administered 2018-04-02: 2 g via INTRAVENOUS
  Filled 2018-04-02: qty 50

## 2018-04-02 MED ORDER — SODIUM CHLORIDE 0.9 % IV SOLN
INTRAVENOUS | Status: DC
  Administered 2018-04-02: 02:00:00 via INTRAVENOUS

## 2018-04-02 MED ORDER — SODIUM CHLORIDE 0.9 % IV SOLN: 250.0000 mL | INTRAVENOUS | Status: DC | PRN

## 2018-04-02 MED ORDER — PROPOFOL 1000 MG/100ML IV EMUL: 5.0000 ug/kg/min | INTRAVENOUS | Status: DC

## 2018-04-02 MED ORDER — NOREPINEPHRINE 4 MG/250ML-% IV SOLN: 0.0000 ug/min | INTRAVENOUS | Status: DC

## 2018-04-02 MED ORDER — SENNOSIDES 8.8 MG/5ML PO SYRP
5.0000 mL | ORAL_SOLUTION | Freq: Two times a day (BID) | ORAL | Status: DC | PRN
  Filled 2018-04-02: qty 5

## 2018-04-02 MED ORDER — POTASSIUM CHLORIDE IN NACL 20-0.9 MEQ/L-% IV SOLN
INTRAVENOUS | Status: DC
  Administered 2018-04-02 – 2018-04-03 (×3): via INTRAVENOUS
  Filled 2018-04-02 (×5): qty 1000

## 2018-04-02 MED ORDER — ENOXAPARIN SODIUM 40 MG/0.4ML ~~LOC~~ SOLN
40.0000 mg | SUBCUTANEOUS | Status: DC
  Administered 2018-04-02 – 2018-04-04 (×3): 40 mg via SUBCUTANEOUS
  Filled 2018-04-02 (×3): qty 0.4

## 2018-04-02 MED ORDER — ACETAMINOPHEN 325 MG PO TABS
650.0000 mg | ORAL_TABLET | ORAL | Status: DC | PRN
  Administered 2018-04-04: 650 mg via ORAL
  Filled 2018-04-02: qty 2

## 2018-04-02 MED ORDER — FENTANYL CITRATE (PF) 100 MCG/2ML IJ SOLN: 100.0000 ug | INTRAMUSCULAR | Status: DC | PRN

## 2018-04-02 MED ORDER — QUETIAPINE FUMARATE 25 MG PO TABS: 50.0000 mg | ORAL_TABLET | Freq: Every day | ORAL | Status: DC

## 2018-04-02 MED ORDER — POTASSIUM CHLORIDE 10 MEQ/100ML IV SOLN
10.0000 meq | INTRAVENOUS | Status: AC
  Administered 2018-04-02 (×2): 10 meq via INTRAVENOUS
  Filled 2018-04-02 (×2): qty 100

## 2018-04-02 MED ORDER — QUETIAPINE FUMARATE 25 MG PO TABS
25.0000 mg | ORAL_TABLET | Freq: Once | ORAL | Status: DC
  Filled 2018-04-02: qty 1

## 2018-04-02 MED ORDER — DEXMEDETOMIDINE HCL IN NACL 400 MCG/100ML IV SOLN
0.4000 ug/kg/h | INTRAVENOUS | Status: DC
  Administered 2018-04-02 (×2): 0.4 ug/kg/h via INTRAVENOUS
  Administered 2018-04-02: 0.9 ug/kg/h via INTRAVENOUS
  Administered 2018-04-03: 1.2 ug/kg/h via INTRAVENOUS
  Administered 2018-04-03 – 2018-04-04 (×3): 1 ug/kg/h via INTRAVENOUS
  Administered 2018-04-04: 1.2 ug/kg/h via INTRAVENOUS
  Filled 2018-04-02 (×7): qty 100

## 2018-04-02 NOTE — Progress Notes (Signed)
eeg completed ° °

## 2018-04-02 NOTE — Progress Notes (Signed)
Bair hugger placed because patient's temperature is dropping.

## 2018-04-02 NOTE — Progress Notes (Signed)
Pt transported to icu room 12 without any incident  Report given given RT in charge of the pt.

## 2018-04-02 NOTE — ED Notes (Signed)
Son's name is Gabriel Rung. Telephone # [phone]

## 2018-04-02 NOTE — Progress Notes (Signed)
[place] [facility] [place] NAME: Diana West    MR#:  [id number]  DATE OF BIRTH:  1955/12/12  SUBJECTIVE:  CHIEF COMPLAINT:   Chief Complaint  Patient presents with  . Drug Overdose   he was extubated this morning but was agitated and put on Precedex.  She is on IVC. REVIEW OF SYSTEMS:  Review of Systems  Unable to perform ROS: Medical condition   DRUG ALLERGIES:   Allergies  Allergen Reactions  . Codeine Itching and Rash  . Toradol [Ketorolac Tromethamine] Rash   VITALS:  Blood pressure 114/60, pulse 63, temperature (!) 96.6 F (35.9 C), temperature source Bladder, resp. rate 14, height 5\' 6"  (1.676 m), weight 112 lb 3.4 oz (50.9 kg), SpO2 100 %. PHYSICAL EXAMINATION:  Physical Exam  Constitutional:  Severe malnutrition.  HENT:  Head: Normocephalic.  Eyes: Pupils are equal, round, and reactive to light. Conjunctivae are normal. No scleral icterus.  Neck: Neck supple. No JVD present. No tracheal deviation present.  Cardiovascular: Normal rate, regular rhythm and normal heart sounds. Exam reveals no gallop.  No murmur heard. Pulmonary/Chest: Effort normal and breath sounds normal. No respiratory distress. She has no wheezes. She has no rales.  Abdominal: Soft. Bowel sounds are normal. She exhibits no distension. There is no tenderness. There is no rebound.  Musculoskeletal: She exhibits no edema or tenderness.  Neurological:  The patient is sedated, unable to exam.  Skin: No rash noted. No erythema.   LABORATORY PANEL:  Female CBC Recent Labs  Lab 04/02/18 0609  WBC 7.2  HGB 9.1*  HCT 28.0*  PLT 241   ------------------------------------------------------------------------------------------------------------------ Chemistries  Recent Labs  Lab 04/01/18 2147 04/02/18 0609  NA 137 134*  K 3.3* 3.9  CL 113* 111  CO2 19* 17*  GLUCOSE 104* 111*  BUN 17 17  CREATININE 0.93 0.65  CALCIUM 8.1* 8.2*  MG  --  1.8    AST 15  --   ALT 8  --   ALKPHOS 56  --   BILITOT 0.3  --    RADIOLOGY:  Ct Head Wo Contrast  Result Date: 04/01/2018 CLINICAL DATA:  Altered level of consciousness, medication overdose. EXAM: CT HEAD WITHOUT CONTRAST TECHNIQUE: Contiguous axial images were obtained from the base of the skull through the vertex without intravenous contrast. COMPARISON:  03/14/2018 FINDINGS: Brain: Involutional changes of the brain, age related without acute intracranial hemorrhage, midline shift or edema. No large vascular territory infarct. Chronic minimal small vessel ischemic disease of periventricular white matter. Midline fourth ventricle and basal cisterns without effacement. No hydrocephalus. Cerebellum and brainstem are unremarkable. No intra-axial mass nor extra-axial collections. Vascular: No hyperdense vessel sign. Skull: Intact Sinuses/Orbits: Nonacute Other: None IMPRESSION: Age related mild involutional changes of the brain with chronic small vessel ischemic disease. No acute intracranial abnormality. Electronically Signed   By: Ashley Royalty M.D.   On: 04/01/2018 23:32   Dg Chest Portable 1 View  Result Date: 04/01/2018 CLINICAL DATA:  Intubated EXAM: PORTABLE CHEST 1 VIEW COMPARISON:  02/12/2018 chest radiograph. FINDINGS: Endotracheal tube tip is 2.4 cm above the carina. Enteric tube terminates in the proximal stomach. Stable cardiomediastinal silhouette with normal heart size. No pneumothorax. No pleural effusion. Lungs appear clear, with no acute consolidative airspace disease and no pulmonary edema. Cholecystectomy clips are seen in the right upper quadrant of the abdomen. IMPRESSION: 1. Well-positioned endotracheal tube. Enteric tube terminates in the proximal stomach. 2. No active cardiopulmonary disease. Electronically  Signed   By: Ilona Sorrel M.D.   On: 04/01/2018 22:29   ASSESSMENT AND PLAN:   Suicide attempt by multiple drug overdose The patient was intubated due to respiratory compromise  from overdose.   She was extubated this morning but was agitated and put on Precedex.  She is on IVC.    Depression with anxiety -holding all home meds at this time, psych consult as above   Hypothyroidism -continue home dose thyroid replacement with the patient is able to take p.o.  Severe malnutrition.  Dietitian consult when the patient wake up. All the records are reviewed and case discussed with Care Management/Social Worker. Management plans discussed with the patient, family and they are in agreement.  CODE STATUS: Full Code  TOTAL TIME TAKING CARE OF THIS PATIENT: 33 minutes.  More than 50% of the time was spent in counseling/coordination of care: YES  POSSIBLE D/C IN 2-3 DAYS, DEPENDING ON CLINICAL CONDITION.    Demetrios Loll M.D on 04/02/2018 at 3:42 PM  Between 7am to 6pm - Pager - [phone]  After 6pm go to [email or web address] - [facility]

## 2018-04-02 NOTE — Progress Notes (Signed)
Patient extubated to room air per Dr. Zoila Shutter order.  Patient agitated yelling and emotional.  Sitter at bedside.  No respiratory distress.

## 2018-04-02 NOTE — Procedures (Signed)
ELECTROENCEPHALOGRAM REPORT   Patient: Diana West       Room #: IC12A-AA EEG No. ID: [id number] Age: 62 y.o.        Sex: female Referring Physician: Bridgett Larsson Report Date:  04/02/2018        Interpreting Physician: Alexis Goodell  History: [name] is an 62 y.o. female s/p intentional overdose with Elavil, Neurontin and Zoloft  Medications:  Seroquel, Precedex  Conditions of Recording:  This is a 16 channel EEG carried out with the patient in the sedated state.  Description:  The background activity is slow and poorly organized.  It consists of a low voltage polymorphic delta activity that is diffusely distributed.  It is continuous with intermittent symmetrical spindle like activity and what appears to be poorly formed vertex central sharp transients.  Painful stimuli was applied with no change in the background rhythm  The patient's eyes were actively opened with no change in the background activity. Hyperventilation and intermittent photic stimulation were not performed.   IMPRESSION: This electroencephalogram is consistent with normal sleep.  No epileptiform activity is noted.     Alexis Goodell, MD Neurology [phone] 04/02/2018, 1:57 PM

## 2018-04-02 NOTE — Progress Notes (Signed)
Extubated to room air without complications

## 2018-04-02 NOTE — ED Notes (Signed)
No change with narcan

## 2018-04-02 NOTE — Progress Notes (Signed)
Continues to have outbursts of agitation, yelling and paranoid.  Combative at times.  NSR per cardiac monitor.  Sitter at bedside.  Son visited this afternoon.

## 2018-04-02 NOTE — Progress Notes (Signed)
Initial Nutrition Assessment  DOCUMENTATION CODES:   Severe malnutrition in context of social or environmental circumstances, Underweight  INTERVENTION:  Will monitor for diet advancement.  Patient will benefit from oral nutrition supplements and MVI with diet advancement.  If diet expected to not be able to be advanced in the next few days related to mental status, recommend placing small-bore NGT as soon as feasible for initiation of enteral nutrition in setting of severe malnutrition.  Monitor magnesium, potassium, and phosphorus daily once nutrition is initated for at least 3 days, MD to replete as needed, as pt is at risk for refeeding syndrome given refeeding risk.  NUTRITION DIAGNOSIS:   Severe Malnutrition related to social / environmental circumstances(anxiety, depression) as evidenced by severe fat depletion, severe muscle depletion.  GOAL:   Patient will meet greater than or equal to 90% of their needs  MONITOR:   Diet advancement, Labs, Weight trends, I & O's  REASON FOR ASSESSMENT:   Other (Comment)(Low BMI)    ASSESSMENT:   62 year old female with PMHx of anxiety, depression, shingles, hx TIA, frequent falls, arthritis, hypothyroidism who was admitted after intentional drug overdose of amitriptyline, gabapentin, and sertraline requiring mechanical intubation for airway protection 6/26-6/27.   At time of RD assessment patient had been sedated for EEG so she was unable to provide any history. No family members present. Per discussion on rounds she became very agitated this morning after extubation requiring Precedex gtt. Unable to assess mouth but it appears patient may be missing many of her teeth, so will need to assess on follow-up for chewing ability.  Patient unable to provide weight history. Per chart she has fluctuated between 107-114 lbs since 07/22/2017.  Medications reviewed and include: Seroquel, NS with KCl 20 mEq/L at 75 mL/hr, Precedex gtt.  Labs  reviewed: CBG 91-127, Sodium 134, CO2 17.  Discussed with RN and on rounds.   NUTRITION - FOCUSED PHYSICAL EXAM:   Most Recent Value  Orbital Region  Severe depletion  Upper Arm Region  Severe depletion  Thoracic and Lumbar Region  Severe depletion  Buccal Region  Severe depletion  Temple Region  Unable to assess  Clavicle Bone Region  Severe depletion  Clavicle and Acromion Bone Region  Severe depletion  Scapular Bone Region  Severe depletion  Dorsal Hand  Unable to assess  Patellar Region  Severe depletion  Anterior Thigh Region  Severe depletion  Posterior Calf Region  Severe depletion  Edema (RD Assessment)  None  Hair  Unable to assess [wrapped]  Eyes  Unable to assess [sedated]  Mouth  Unable to assess [sedated]  Skin  Unable to assess  Nails  Unable to assess [mitts]      Diet Order:   Diet Order           Diet NPO time specified  Diet effective now          EDUCATION NEEDS:   Not appropriate for education at this time  Skin:  Skin Assessment: Reviewed RN Assessment(ecchymosis to bilateral arms and hands; scabs to hands)  Last BM:  Unknown  Height:   Ht Readings from Last 1 Encounters:  04/02/18 5\' 6"  (1.676 m)    Weight:   Wt Readings from Last 1 Encounters:  04/02/18 112 lb 3.4 oz (50.9 kg)    Ideal Body Weight:  59.1 kg  BMI:  Body mass index is 18.11 kg/m.  Estimated Nutritional Needs:   Kcal:  1525-1780 (30-35 kcal/kg)  Protein:  75-85  grams (1.5-1.7 grams/kg)  Fluid:  1.5-1.8 L/day (30-35 mL/kg)  Willey Blade, MS, RD, LDN Office: [phone] Pager: [phone] After Hours/Weekend Pager: [phone]

## 2018-04-02 NOTE — ED Notes (Signed)
Total pills missing : Amitriptyline 17 missing @ 150mg  = 2550mg  Gabapentin 49 missing @600mg  = 29,400mg  Sertraline 2 missing @ 100mg  = 200mg 

## 2018-04-02 NOTE — Progress Notes (Signed)
Arrived from ED with Fentanyl and Versed drips running.  Hinton Dyer, NP changed orders with both medications being discontinued.  Attempted to waste said medications through Pyxis only to find they weren't pulled from the machine in the ER.  Called ER to inquire about medications and was told the meds came from the pharmacy with the yellow paper sheets used to document usage and waste and that they would be arriving via tube system directly.  Received no such sheets.  For the record  230 ml fentanyl and 61ml of versed was wasted and witnessed by [employer], Therapist, sports

## 2018-04-02 NOTE — Progress Notes (Signed)
RN called and spoke with Dr. Mortimer Fries and made MD aware that patient continues to be very agitated kicking and throwing legs over bedside, yelling and constantly crying and screaming with paranoid conversation stating "y'all are trying to kill me."  Dr. Mortimer Fries stated that he would come by and see patient again.  Sitter at bedside.

## 2018-04-02 NOTE — Consult Note (Signed)
[facility] Consult   Reason for Consult: Consult for this 62 year old woman with a history of depression who came into the hospital after a suicide attempt by overdose Referring Physician: Bridgett Larsson Patient Identification: Diana West MRN:  [id number] Principal Diagnosis: Suicide attempt by multiple drug overdose [facility]) Diagnosis:   Patient Active Problem List   Diagnosis Date Noted  . Suicide attempt by multiple drug overdose ([place]) [T50.902A] 04/01/2018  . Hypothyroidism [E03.9] 04/01/2018  . Acute encephalopathy [G93.40] 03/17/2018  . Recurrent moderate major depressive disorder with anxiety ([place]) [F33.1, F41.9] 03/16/2018    Class: Acute  . Pressure injury of skin [L89.90] 03/16/2018  . Metabolic encephalopathy [L27.51] 03/16/2018  . Malnutrition of moderate degree [E44.0] 03/16/2018  . Acute UTI [N39.0] 02/13/2018  . Pancreatic cyst [K86.2] 11/12/2017  . Protein-calorie malnutrition, severe [E43] 07/23/2017  . Acute cystitis [N30.00] 07/22/2017  . TIA (transient ischemic attack) [G45.9] 07/06/2017  . Hypotension [I95.9] 06/26/2017  . Falls frequently [R29.6] 06/26/2017  . Anemia [D64.9] 06/26/2017  . Depression with anxiety [F41.8] 06/26/2017  . FH: premature coronary heart disease [Z82.49] 06/26/2017  . Lumbar disc disease [M51.9] 06/26/2017  . Cervical disc disease [M50.90] 06/26/2017  . Chronic headaches [R51] 06/26/2017  . History of PSVT (paroxysmal supraventricular tachycardia) [Z86.79] 06/26/2017  . Tinea corporis [B35.4] 06/26/2017  . Postherpetic neuralgia [B02.29] 06/26/2017  . CKD (chronic kidney disease) stage 3, GFR 30-59 ml/min (HCC) [N18.3] 06/26/2017    Total Time spent with patient: 1 hour  Subjective:   Diana West is a 62 y.o. female patient admitted with patient not able to give information.  HPI: Patient seen chart reviewed.  Spoke with staff in the intensive care unit.  62 year old woman came to the hospital after  taking an overdose of multiple medications.  She told her family about it before doing it and it appears that she was very clear about having suicidal intent.  Overdose included amitriptyline as well as some other medications.  By the time I saw her this morning the patient was on Precedex and was unresponsive.  Nursing tells me that earlier in the day she had woken up with extremely agitated fighting and striking out which is why she had to be put on the Precedex.  Patient reportedly just recently learned about a cancer diagnosis or at least the possibility of a cancer diagnosis.  Social history: Not entirely clear whether she lives with anyone.  It seems like possibly there are other family members including adult children involved  Medical history: Recently found to have a "spot" on her pancreas.  Looking through the notes it is not clear to me whether she has had a enough workup for them to be definitive about whether or not this is cancer.  Substance abuse history: Does not appear to have a history of substance abuse  Past Psychiatric History: Patient has been seen previously only by our consult service just a week or so ago although she does have a more long-term history of depression and has been on multiple antidepressant medicines.  Unknown if there is been prior suicide attempts  Risk to Self:   Risk to Others:   Prior Inpatient Therapy:   Prior Outpatient Therapy:    Past Medical History:  Past Medical History:  Diagnosis Date  . Anxiety   . Anxiety   . Arthritis   . Cardiac arrhythmia   . Depression   . Falls frequently   . Hypothyroidism   . Palpitation   .  Shingles    she states "they are inside and not contagious"  . TIA (transient ischemic attack)     Past Surgical History:  Procedure Laterality Date  . ABDOMINAL HYSTERECTOMY    . CESAREAN SECTION    . CHOLECYSTECTOMY     Family History:  Family History  Problem Relation Age of Onset  . CAD Mother   . CAD  Father    Family Psychiatric  History: Unknown Social History:  Social History   Substance and Sexual Activity  Alcohol Use No     Social History   Substance and Sexual Activity  Drug Use No    Social History   Socioeconomic History  . Marital status: Divorced    Spouse name: Not on file  . Number of children: Not on file  . Years of education: Not on file  . Highest education level: Not on file  Occupational History  . Not on file  Social Needs  . Financial resource strain: Not on file  . Food insecurity:    Worry: Not on file    Inability: Not on file  . Transportation needs:    Medical: Not on file    Non-medical: Not on file  Tobacco Use  . Smoking status: Current Some Day Smoker    Packs/day: 0.25    Types: Cigarettes  . Smokeless tobacco: Never Used  Substance and Sexual Activity  . Alcohol use: No  . Drug use: No  . Sexual activity: Not on file  Lifestyle  . Physical activity:    Days per week: Not on file    Minutes per session: Not on file  . Stress: Not on file  Relationships  . Social connections:    Talks on phone: Not on file    Gets together: Not on file    Attends religious service: Not on file    Active member of club or organization: Not on file    Attends meetings of clubs or organizations: Not on file    Relationship status: Not on file  Other Topics Concern  . Not on file  Social History Narrative   Lives at home by herself. Has a walker to ambulate   Additional Social History:    Allergies:   Allergies  Allergen Reactions  . Codeine Itching and Rash  . Toradol [Ketorolac Tromethamine] Rash    Labs:  Results for orders placed or performed during the hospital encounter of 04/01/18 (from the past 48 hour(s))  Blood gas, venous     Status: Abnormal   Collection Time: 04/01/18  9:42 PM  Result Value Ref Range   FIO2 100.00    Delivery systems VENTILATOR    Mode ASSIST CONTROL    VT 500 mL   LHR 16.0 resp/min   Peep/cpap  5.0 cm H20   pH, Ven 7.32 7.250 - 7.430   pCO2, Ven 37 (L) 44.0 - 60.0 mmHg   pO2, Ven 90.0 (H) 32.0 - 45.0 mmHg   Bicarbonate 19.1 (L) 20.0 - 28.0 mmol/L   Acid-base deficit 6.4 (H) 0.0 - 2.0 mmol/L   O2 Saturation 96.2 %   Patient temperature 37.0    Collection site VENOUS    Sample type VENOUS    Mechanical Rate 16.0     Comment: Performed at [facility], [address] [place], [place] [address]  Lactic acid, plasma     Status: None   Collection Time: 04/01/18  9:42 PM  Result Value Ref Range  Lactic Acid, Venous 0.7 0.5 - 1.9 mmol/L    Comment: Performed at [facility] [address] [place], [place] [address]  CBC     Status: Abnormal   Collection Time: 04/01/18  9:47 PM  Result Value Ref Range   WBC 6.3 3.6 - 11.0 K/uL   RBC 3.17 (L) 3.80 - 5.20 MIL/uL   Hemoglobin 8.8 (L) 12.0 - 16.0 g/dL   HCT 26.7 (L) 35.0 - 47.0 %   MCV 84.2 80.0 - 100.0 fL   MCH 27.7 26.0 - 34.0 pg   MCHC 32.9 32.0 - 36.0 g/dL   RDW 18.0 (H) 11.5 - 14.5 %   Platelets 254 150 - 440 K/uL    Comment: Performed at [facility], [address] [place], [place] [address]  Comprehensive metabolic panel     Status: Abnormal   Collection Time: 04/01/18  9:47 PM  Result Value Ref Range   Sodium 137 135 - 145 mmol/L   Potassium 3.3 (L) 3.5 - 5.1 mmol/L   Chloride 113 (H) 98 - 111 mmol/L    Comment: Please note change in reference range.   CO2 19 (L) 22 - 32 mmol/L   Glucose, Bld 104 (H) 70 - 99 mg/dL    Comment: Please note change in reference range.   BUN 17 8 - 23 mg/dL    Comment: Please note change in reference range.   Creatinine, Ser 0.93 0.44 - 1.00 mg/dL   Calcium 8.1 (L) 8.9 - 10.3 mg/dL   Total Protein 6.2 (L) 6.5 - 8.1 g/dL   Albumin 3.1 (L) 3.5 - 5.0 g/dL   AST 15 15 - 41 U/L   ALT 8 0 - 44 U/L    Comment: Please note change in reference range.   Alkaline Phosphatase 56 38 - 126 U/L   Total Bilirubin 0.3 0.3 - 1.2 mg/dL   GFR calc non Af Amer >60 >60  mL/min   GFR calc Af Amer >60 >60 mL/min    Comment: (NOTE) The eGFR has been calculated using the CKD EPI equation. This calculation has not been validated in all clinical situations. eGFR's persistently <60 mL/min signify possible Chronic Kidney Disease.    Anion gap 5 5 - 15    Comment: Performed at [facility], [address] [place], [place] [address]  Ethanol     Status: None   Collection Time: 04/01/18  9:47 PM  Result Value Ref Range   Alcohol, Ethyl (B) <10 <10 mg/dL    Comment: (NOTE) Lowest detectable limit for serum alcohol is 10 mg/dL. For medical purposes only. Performed at [facility], [address] [place], [place] [address]   Acetaminophen level     Status: Abnormal   Collection Time: 04/01/18  9:47 PM  Result Value Ref Range   Acetaminophen (Tylenol), Serum <10 (L) 10 - 30 ug/mL    Comment: (NOTE) Therapeutic concentrations vary significantly. A range of 10-30 ug/mL  may be an effective concentration for many patients. However, some  are best treated at concentrations outside of this range. Acetaminophen concentrations >150 ug/mL at 4 hours after ingestion  and >50 ug/mL at 12 hours after ingestion are often associated with  toxic reactions. Performed at [facility], [address] [place], [place] [address]   Salicylate level     Status: None   Collection Time: 04/01/18  9:47 PM  Result Value Ref Range   Salicylate Lvl <1.0 2.8 - 30.0 mg/dL  Comment: Performed at [facility], [address] [place], [place] [address]  Urine Drug Screen, Qualitative [facility] only)     Status: Abnormal   Collection Time: 04/01/18  9:49 PM  Result Value Ref Range   Tricyclic, Ur Screen POSITIVE (A) NONE DETECTED   Amphetamines, Ur Screen NONE DETECTED NONE DETECTED   MDMA (Ecstasy)Ur Screen NONE DETECTED NONE DETECTED   Cocaine Metabolite,Ur [place] NONE DETECTED NONE DETECTED   Opiate, Ur Screen NONE DETECTED NONE DETECTED    Phencyclidine (PCP) Ur S NONE DETECTED NONE DETECTED   Cannabinoid 50 Ng, Ur [place] NONE DETECTED NONE DETECTED   Barbiturates, Ur Screen (A) NONE DETECTED    Result not available. Reagent lot number recalled by manufacturer.   Benzodiazepine, Ur Scrn POSITIVE (A) NONE DETECTED   Methadone Scn, Ur NONE DETECTED NONE DETECTED    Comment: (NOTE) Tricyclics + metabolites, urine    Cutoff 1000 ng/mL Amphetamines + metabolites, urine  Cutoff 1000 ng/mL MDMA (Ecstasy), urine              Cutoff 500 ng/mL Cocaine Metabolite, urine          Cutoff 300 ng/mL Opiate + metabolites, urine        Cutoff 300 ng/mL Phencyclidine (PCP), urine         Cutoff 25 ng/mL Cannabinoid, urine                 Cutoff 50 ng/mL Barbiturates + metabolites, urine  Cutoff 200 ng/mL Benzodiazepine, urine              Cutoff 200 ng/mL Methadone, urine                   Cutoff 300 ng/mL The urine drug screen provides only a preliminary, unconfirmed analytical test result and should not be used for non-medical purposes. Clinical consideration and professional judgment should be applied to any positive drug screen result due to possible interfering substances. A more specific alternate chemical method must be used in order to obtain a confirmed analytical result. Gas chromatography / mass spectrometry (GC/MS) is the preferred confirmat ory method. Performed at [facility], [address] [place], [place] [address]   Urinalysis, Complete w Microscopic     Status: Abnormal   Collection Time: 04/01/18  9:56 PM  Result Value Ref Range   Color, Urine YELLOW (A) YELLOW   APPearance CLEAR (A) CLEAR   Specific Gravity, Urine 1.019 1.005 - 1.030   pH 6.0 5.0 - 8.0   Glucose, UA NEGATIVE NEGATIVE mg/dL   Hgb urine dipstick NEGATIVE NEGATIVE   Bilirubin Urine NEGATIVE NEGATIVE   Ketones, ur NEGATIVE NEGATIVE mg/dL   Protein, ur NEGATIVE NEGATIVE mg/dL   Nitrite NEGATIVE NEGATIVE   Leukocytes, UA NEGATIVE NEGATIVE    RBC / HPF 0-5 0 - 5 RBC/hpf   WBC, UA 0-5 0 - 5 WBC/hpf   Bacteria, UA NONE SEEN NONE SEEN   Squamous Epithelial / LPF NONE SEEN 0 - 5    Comment: Performed at [facility], [address] [place], [place] [address]  Blood culture (routine x 2)     Status: None (Preliminary result)   Collection Time: 04/01/18  9:56 PM  Result Value Ref Range   Specimen Description BLOOD RIGHT AC    Special Requests      BOTTLES DRAWN AEROBIC AND ANAEROBIC Blood Culture adequate volume   Culture      NO GROWTH < 12 HOURS Performed  at [facility], [address] [place], [place] [address]    Report Status PENDING   Blood culture (routine x 2)     Status: None (Preliminary result)   Collection Time: 04/01/18  9:56 PM  Result Value Ref Range   Specimen Description BLOOD LEFT WRIST    Special Requests      BOTTLES DRAWN AEROBIC AND ANAEROBIC Blood Culture results may not be optimal due to an excessive volume of blood received in culture bottles   Culture      NO GROWTH < 12 HOURS Performed at [facility], [address], [place], [place] [address]    Report Status PENDING   Glucose, capillary     Status: Abnormal   Collection Time: 04/02/18  1:07 AM  Result Value Ref Range   Glucose-Capillary 105 (H) 70 - 99 mg/dL   Comment 1 Notify RN    Comment 2 Document in Chart   Blood gas, arterial     Status: Abnormal   Collection Time: 04/02/18  3:00 AM  Result Value Ref Range   FIO2 70.00    Delivery systems VENTILATOR    Mode PRESSURE REGULATED VOLUME CONTROL    VT 500.0 mL   Peep/cpap 5.0 cm H20   pH, Arterial 7.38 7.350 - 7.450   pCO2 arterial 31 (L) 32.0 - 48.0 mmHg   pO2, Arterial 333 (H) 83.0 - 108.0 mmHg   Bicarbonate 17.5 (L) 20.0 - 28.0 mmol/L   Acid-base deficit 7.1 (H) 0.0 - 2.0 mmol/L   O2 Saturation 99.9 %   Patient temperature 35.5    Collection site REVIEWED BY    Sample type ARTERIAL DRAW    Allens test (pass/fail) PASS PASS   Mechanical Rate  16.0     Comment: Performed at [facility], [address] [place], [place] [address]  MRSA PCR Screening     Status: Abnormal   Collection Time: 04/02/18  3:11 AM  Result Value Ref Range   MRSA by PCR POSITIVE (A) NEGATIVE    Comment:        The GeneXpert MRSA Assay (FDA approved for NASAL specimens only), is one component of a comprehensive MRSA colonization surveillance program. It is not intended to diagnose MRSA infection nor to guide or monitor treatment for MRSA infections. c/ DALE HOPKINS _0  04/02/18 [facility] Performed at [facility], [address] [place], [place] [address]   Lactic acid, plasma     Status: None   Collection Time: 04/02/18  6:09 AM  Result Value Ref Range   Lactic Acid, Venous 0.7 0.5 - 1.9 mmol/L    Comment: Performed at [facility], [address] [place], [place] [address]  Ammonia     Status: None   Collection Time: 04/02/18  6:09 AM  Result Value Ref Range   Ammonia 27 9 - 35 umol/L    Comment: Performed at [facility], [address] [place], [place] [address]  Magnesium     Status: None   Collection Time: 04/02/18  6:09 AM  Result Value Ref Range   Magnesium 1.8 1.7 - 2.4 mg/dL    Comment: Performed at [facility], [address] [place], [place] [address]  CBC     Status: Abnormal   Collection Time: 04/02/18  6:09 AM  Result Value Ref Range   WBC 7.2 3.6 - 11.0 K/uL   RBC 3.32 (L) 3.80 - 5.20 MIL/uL   Hemoglobin 9.1 (L) 12.0 -  16.0 g/dL   HCT 28.0 (L) 35.0 - 47.0 %   MCV 84.3 80.0 - 100.0 fL   MCH 27.3 26.0 - 34.0 pg   MCHC 32.4 32.0 - 36.0 g/dL   RDW 17.8 (H) 11.5 - 14.5 %   Platelets 241 150 - 440 K/uL    Comment: Performed at [facility], [address] [place], [place] [address]  Basic metabolic panel     Status: Abnormal   Collection Time: 04/02/18  6:09 AM  Result Value Ref Range   Sodium 134 (L) 135 - 145 mmol/L   Potassium 3.9 3.5 - 5.1 mmol/L   Chloride  111 98 - 111 mmol/L    Comment: Please note change in reference range.   CO2 17 (L) 22 - 32 mmol/L   Glucose, Bld 111 (H) 70 - 99 mg/dL    Comment: Please note change in reference range.   BUN 17 8 - 23 mg/dL    Comment: Please note change in reference range.   Creatinine, Ser 0.65 0.44 - 1.00 mg/dL   Calcium 8.2 (L) 8.9 - 10.3 mg/dL   GFR calc non Af Amer >60 >60 mL/min   GFR calc Af Amer >60 >60 mL/min    Comment: (NOTE) The eGFR has been calculated using the CKD EPI equation. This calculation has not been validated in all clinical situations. eGFR's persistently <60 mL/min signify possible Chronic Kidney Disease.    Anion gap 6 5 - 15    Comment: Performed at [facility], [address] [place], [place] [address]  Troponin I     Status: None   Collection Time: 04/02/18  6:09 AM  Result Value Ref Range   Troponin I <0.03 <0.03 ng/mL    Comment: Performed at [facility], [address] [place], [place] [address]  Triglycerides     Status: None   Collection Time: 04/02/18  6:09 AM  Result Value Ref Range   Triglycerides 112 <150 mg/dL    Comment: Performed at [facility], [address] [place], [place] [address]  Phosphorus     Status: None   Collection Time: 04/02/18  6:09 AM  Result Value Ref Range   Phosphorus 3.4 2.5 - 4.6 mg/dL    Comment: Performed at [facility], [address] [place],  [address]  Glucose, capillary     Status: Abnormal   Collection Time: 04/02/18  8:01 AM  Result Value Ref Range   Glucose-Capillary 127 (H) 70 - 99 mg/dL  Glucose, capillary     Status: Abnormal   Collection Time: 04/02/18 11:30 AM  Result Value Ref Range   Glucose-Capillary 111 (H) 70 - 99 mg/dL  Glucose, capillary     Status: None   Collection Time: 04/02/18  4:39 PM  Result Value Ref Range   Glucose-Capillary 91 70 - 99 mg/dL    Current Facility-Administered Medications  Medication Dose Route Frequency Provider Last  Rate Last Dose  . 0.9 %  sodium chloride infusion  250 mL Intravenous PRN Lance Coon, MD      . 0.9 % NaCl with KCl 20 mEq/ L  infusion   Intravenous Continuous Demetrios Loll, MD 75 mL/hr at 04/02/18 1600    . acetaminophen (TYLENOL) tablet 650 mg  650 mg Oral Q4H PRN Lance Coon, MD      . dexmedetomidine (PRECEDEX) 400 MCG/100ML (4 mcg/mL) infusion  0.4-1 mcg/kg/hr Intravenous Titrated Flora Lipps, MD 5.1 mL/hr at 04/02/18 1634 0.4  mcg/kg/hr at 04/02/18 1634  . enoxaparin (LOVENOX) injection 40 mg  40 mg Subcutaneous Q24H Lance Coon, MD      . LORazepam (ATIVAN) injection 1-2 mg  1-2 mg Intravenous Q4H PRN Awilda Bill, NP      . QUEtiapine (SEROQUEL) tablet 25 mg  25 mg Oral Once Flora Lipps, MD      . QUEtiapine (SEROQUEL) tablet 50 mg  50 mg Oral QHS Flora Lipps, MD      . sennosides (SENOKOT) 8.8 MG/5ML syrup 5 mL  5 mL Per Tube BID PRN Awilda Bill, NP        Musculoskeletal: Strength & Muscle Tone: decreased Gait & Station: unable to stand Patient leans: N/A  Psychiatric Specialty Exam: Physical Exam  Nursing note and vitals reviewed. Constitutional: She appears well-developed.  HENT:  Head: Normocephalic and atraumatic.  Eyes: Pupils are equal, round, and reactive to light. Conjunctivae are normal.  Neck: Normal range of motion.  Cardiovascular: Normal heart sounds.  Respiratory: Effort normal. No respiratory distress.  GI: Soft.  Musculoskeletal: Normal range of motion.  Neurological: She is alert.  Skin: Skin is warm and dry.  Psychiatric: Her affect is blunt. She is noncommunicative.    Review of Systems  Unable to perform ROS: Mental status change    Blood pressure 99/79, pulse 79, temperature 99 F (37.2 C), resp. rate 12, height _0  (1.676 m), weight 50.9 kg (112 lb 3.4 oz), SpO2 100 %.Body mass index is 18.11 kg/m.  General Appearance: Disheveled  Eye Contact:  None  Speech:  Negative  Volume:  Decreased  Mood:  Negative  Affect:  Negative   Thought Process:  NA  Orientation:  Negative  Thought Content:  Negative  Suicidal Thoughts:  No  Homicidal Thoughts:  No  Memory:  Negative  Judgement:  Negative  Insight:  Negative  Psychomotor Activity:  Negative  Concentration:  Concentration: Negative  Recall:  Negative  Fund of Knowledge:  Negative  Language:  Negative  Akathisia:  Negative  Handed:  Right  AIMS (if indicated):     Assets:  Social Support  ADL's:  Impaired  Cognition:  Impaired,  Severe  Sleep:        Treatment Plan Summary: Daily contact with patient to assess and evaluate symptoms and progress in treatment, Medication management and Plan This is a 62 year old woman who made a suicide attempt by overdose of multiple drugs including amitriptyline.  Sounds like she was probably having a cholinergic delirium this morning.  When I saw her she was on a Precedex drip but nursing was planning to try to start cutting that back.  I did not add any new psychiatric medicine at this time.  Depending on her underlying physical condition we will either recommend geriatric psychiatry or plan for admission downstairs ultimately.  I will continue to follow up in the hospital.  Disposition: Recommend psychiatric Inpatient admission when medically cleared.  Alethia Berthold, MD 04/02/2018 5:50 PM

## 2018-04-03 DIAGNOSIS — G9340 Encephalopathy, unspecified: Secondary | ICD-10-CM

## 2018-04-03 LAB — GLUCOSE, CAPILLARY
GLUCOSE-CAPILLARY: 79 mg/dL (ref 70–99)
GLUCOSE-CAPILLARY: 180 mg/dL — AB (ref 70–99)
Glucose-Capillary: 63 mg/dL — ABNORMAL LOW (ref 70–99)
Glucose-Capillary: 65 mg/dL — ABNORMAL LOW (ref 70–99)
Glucose-Capillary: 87 mg/dL (ref 70–99)
Glucose-Capillary: 88 mg/dL (ref 70–99)
Glucose-Capillary: 91 mg/dL (ref 70–99)
Glucose-Capillary: 97 mg/dL (ref 70–99)

## 2018-04-03 LAB — BASIC METABOLIC PANEL
ANION GAP: 5 (ref 5–15)
BUN: 11 mg/dL (ref 8–23)
CO2: 19 mmol/L — ABNORMAL LOW (ref 22–32)
Calcium: 8.7 mg/dL — ABNORMAL LOW (ref 8.9–10.3)
Chloride: 116 mmol/L — ABNORMAL HIGH (ref 98–111)
Creatinine, Ser: 0.7 mg/dL (ref 0.44–1.00)
GLUCOSE: 80 mg/dL (ref 70–99)
POTASSIUM: 4.2 mmol/L (ref 3.5–5.1)
SODIUM: 140 mmol/L (ref 135–145)

## 2018-04-03 MED ORDER — CHLORHEXIDINE GLUCONATE CLOTH 2 % EX PADS
6.0000 | MEDICATED_PAD | Freq: Every day | CUTANEOUS | Status: DC
  Administered 2018-04-03 – 2018-04-05 (×3): 6 via TOPICAL

## 2018-04-03 MED ORDER — DEXTROSE-NACL 5-0.45 % IV SOLN
INTRAVENOUS | Status: DC
  Administered 2018-04-03: 18:00:00 via INTRAVENOUS

## 2018-04-03 MED ORDER — MUPIROCIN 2 % EX OINT
1.0000 "application " | TOPICAL_OINTMENT | Freq: Two times a day (BID) | CUTANEOUS | Status: DC
  Administered 2018-04-03 – 2018-04-05 (×5): 1 via NASAL
  Filled 2018-04-03 (×2): qty 22

## 2018-04-03 MED ORDER — LORAZEPAM 2 MG/ML IJ SOLN: 1.0000 mg | INTRAMUSCULAR | Status: DC | PRN

## 2018-04-03 MED ORDER — DEXTROSE 50 % IV SOLN
1.0000 | Freq: Once | INTRAVENOUS | Status: AC
  Administered 2018-04-03: 1 via INTRAVENOUS

## 2018-04-03 MED ORDER — ZIPRASIDONE MESYLATE 20 MG IM SOLR
10.0000 mg | INTRAMUSCULAR | Status: DC | PRN
  Administered 2018-04-03: 10 mg via INTRAMUSCULAR
  Filled 2018-04-03 (×2): qty 20

## 2018-04-03 MED ORDER — DEXTROSE 50 % IV SOLN
INTRAVENOUS | Status: AC
  Administered 2018-04-03: 1 via INTRAVENOUS
  Filled 2018-04-03: qty 50

## 2018-04-03 MED ORDER — LORAZEPAM 2 MG/ML IJ SOLN
1.0000 mg | Freq: Once | INTRAMUSCULAR | Status: AC
  Administered 2018-04-03: 1 mg via INTRAVENOUS
  Filled 2018-04-03: qty 1

## 2018-04-03 MED ORDER — ZIPRASIDONE MESYLATE 20 MG IM SOLR
10.0000 mg | INTRAMUSCULAR | Status: DC | PRN
  Filled 2018-04-03 (×2): qty 20

## 2018-04-03 NOTE — Progress Notes (Signed)
Dr. Jefferson Fuel present and gave order for patient to be stepdown level of care.

## 2018-04-03 NOTE — Progress Notes (Signed)
Blood glucose 63.  Patient refusing to take PO juice therefore amp of D50 ordered.

## 2018-04-03 NOTE — Consult Note (Signed)
[facility] Consult   Reason for Consult: Follow-up consult for 62 year old woman who made a suicide attempt by overdose of antidepressants Referring Physician: Bridgett Larsson Patient Identification: Diana West MRN:  [id number] Principal Diagnosis: Suicide attempt by multiple drug overdose [facility]) Diagnosis:   Patient Active Problem List   Diagnosis Date Noted  . Suicide attempt by multiple drug overdose ([place]) [T50.902A] 04/01/2018  . Hypothyroidism [E03.9] 04/01/2018  . Acute encephalopathy [G93.40] 03/17/2018  . Recurrent moderate major depressive disorder with anxiety ([place]) [F33.1, F41.9] 03/16/2018    Class: Acute  . Pressure injury of skin [L89.90] 03/16/2018  . Metabolic encephalopathy [K99.83] 03/16/2018  . Malnutrition of moderate degree [E44.0] 03/16/2018  . Acute UTI [N39.0] 02/13/2018  . Pancreatic cyst [K86.2] 11/12/2017  . Protein-calorie malnutrition, severe [E43] 07/23/2017  . Acute cystitis [N30.00] 07/22/2017  . TIA (transient ischemic attack) [G45.9] 07/06/2017  . Hypotension [I95.9] 06/26/2017  . Falls frequently [R29.6] 06/26/2017  . Anemia [D64.9] 06/26/2017  . Depression with anxiety [F41.8] 06/26/2017  . FH: premature coronary heart disease [Z82.49] 06/26/2017  . Lumbar disc disease [M51.9] 06/26/2017  . Cervical disc disease [M50.90] 06/26/2017  . Chronic headaches [R51] 06/26/2017  . History of PSVT (paroxysmal supraventricular tachycardia) [Z86.79] 06/26/2017  . Tinea corporis [B35.4] 06/26/2017  . Postherpetic neuralgia [B02.29] 06/26/2017  . CKD (chronic kidney disease) stage 3, GFR 30-59 ml/min (HCC) [N18.3] 06/26/2017    Total Time spent with patient: 20 minutes  Subjective:   Diana West is a 62 y.o. female patient admitted with patient not able to give information.  HPI: Patient seen chart reviewed.  Spoke with intensive care unit staff.  Patient continues to be on a Precedex drip.  She is delirious and somewhat  agitated.  Could not make any sense out of anything that she says.  When she gets agitated she seems to be quite unhappy.  Past Psychiatric History: Past history of depression  Risk to Self:   Risk to Others:   Prior Inpatient Therapy:   Prior Outpatient Therapy:    Past Medical History:  Past Medical History:  Diagnosis Date  . Anxiety   . Anxiety   . Arthritis   . Cardiac arrhythmia   . Depression   . Falls frequently   . Hypothyroidism   . Palpitation   . Shingles    she states "they are inside and not contagious"  . TIA (transient ischemic attack)     Past Surgical History:  Procedure Laterality Date  . ABDOMINAL HYSTERECTOMY    . CESAREAN SECTION    . CHOLECYSTECTOMY     Family History:  Family History  Problem Relation Age of Onset  . CAD Mother   . CAD Father    Family Psychiatric  History: See previous note Social History:  Social History   Substance and Sexual Activity  Alcohol Use No     Social History   Substance and Sexual Activity  Drug Use No    Social History   Socioeconomic History  . Marital status: Divorced    Spouse name: Not on file  . Number of children: Not on file  . Years of education: Not on file  . Highest education level: Not on file  Occupational History  . Not on file  Social Needs  . Financial resource strain: Not on file  . Food insecurity:    Worry: Not on file    Inability: Not on file  . Transportation needs:    Medical: Not on  file    Non-medical: Not on file  Tobacco Use  . Smoking status: Current Some Day Smoker    Packs/day: 0.25    Types: Cigarettes  . Smokeless tobacco: Never Used  Substance and Sexual Activity  . Alcohol use: No  . Drug use: No  . Sexual activity: Not on file  Lifestyle  . Physical activity:    Days per week: Not on file    Minutes per session: Not on file  . Stress: Not on file  Relationships  . Social connections:    Talks on phone: Not on file    Gets together: Not on file     Attends religious service: Not on file    Active member of club or organization: Not on file    Attends meetings of clubs or organizations: Not on file    Relationship status: Not on file  Other Topics Concern  . Not on file  Social History Narrative   Lives at home by herself. Has a walker to ambulate   Additional Social History:    Allergies:   Allergies  Allergen Reactions  . Codeine Itching and Rash  . Toradol [Ketorolac Tromethamine] Rash    Labs:  Results for orders placed or performed during the hospital encounter of 04/01/18 (from the past 48 hour(s))  Blood gas, venous     Status: Abnormal   Collection Time: 04/01/18  9:42 PM  Result Value Ref Range   FIO2 100.00    Delivery systems VENTILATOR    Mode ASSIST CONTROL    VT 500 mL   LHR 16.0 resp/min   Peep/cpap 5.0 cm H20   pH, Ven 7.32 7.250 - 7.430   pCO2, Ven 37 (L) 44.0 - 60.0 mmHg   pO2, Ven 90.0 (H) 32.0 - 45.0 mmHg   Bicarbonate 19.1 (L) 20.0 - 28.0 mmol/L   Acid-base deficit 6.4 (H) 0.0 - 2.0 mmol/L   O2 Saturation 96.2 %   Patient temperature 37.0    Collection site VENOUS    Sample type VENOUS    Mechanical Rate 16.0     Comment: Performed at [facility], [address] [place], [place] [address]  Lactic acid, plasma     Status: None   Collection Time: 04/01/18  9:42 PM  Result Value Ref Range   Lactic Acid, Venous 0.7 0.5 - 1.9 mmol/L    Comment: Performed at [facility], [address] [place], [place] [address]  CBC     Status: Abnormal   Collection Time: 04/01/18  9:47 PM  Result Value Ref Range   WBC 6.3 3.6 - 11.0 K/uL   RBC 3.17 (L) 3.80 - 5.20 MIL/uL   Hemoglobin 8.8 (L) 12.0 - 16.0 g/dL   HCT 26.7 (L) 35.0 - 47.0 %   MCV 84.2 80.0 - 100.0 fL   MCH 27.7 26.0 - 34.0 pg   MCHC 32.9 32.0 - 36.0 g/dL   RDW 18.0 (H) 11.5 - 14.5 %   Platelets 254 150 - 440 K/uL    Comment: Performed at [facility], [address] [place], [place] [address]   Comprehensive metabolic panel     Status: Abnormal   Collection Time: 04/01/18  9:47 PM  Result Value Ref Range   Sodium 137 135 - 145 mmol/L   Potassium 3.3 (L) 3.5 - 5.1 mmol/L   Chloride 113 (H) 98 - 111 mmol/L    Comment: Please note change in reference range.   CO2  19 (L) 22 - 32 mmol/L   Glucose, Bld 104 (H) 70 - 99 mg/dL    Comment: Please note change in reference range.   BUN 17 8 - 23 mg/dL    Comment: Please note change in reference range.   Creatinine, Ser 0.93 0.44 - 1.00 mg/dL   Calcium 8.1 (L) 8.9 - 10.3 mg/dL   Total Protein 6.2 (L) 6.5 - 8.1 g/dL   Albumin 3.1 (L) 3.5 - 5.0 g/dL   AST 15 15 - 41 U/L   ALT 8 0 - 44 U/L    Comment: Please note change in reference range.   Alkaline Phosphatase 56 38 - 126 U/L   Total Bilirubin 0.3 0.3 - 1.2 mg/dL   GFR calc non Af Amer >60 >60 mL/min   GFR calc Af Amer >60 >60 mL/min    Comment: (NOTE) The eGFR has been calculated using the CKD EPI equation. This calculation has not been validated in all clinical situations. eGFR's persistently <60 mL/min signify possible Chronic Kidney Disease.    Anion gap 5 5 - 15    Comment: Performed at [facility], [address] [place], [place] [address]  Ethanol     Status: None   Collection Time: 04/01/18  9:47 PM  Result Value Ref Range   Alcohol, Ethyl (B) <10 <10 mg/dL    Comment: (NOTE) Lowest detectable limit for serum alcohol is 10 mg/dL. For medical purposes only. Performed at [facility], [address] [place], [place] [address]   Acetaminophen level     Status: Abnormal   Collection Time: 04/01/18  9:47 PM  Result Value Ref Range   Acetaminophen (Tylenol), Serum <10 (L) 10 - 30 ug/mL    Comment: (NOTE) Therapeutic concentrations vary significantly. A range of 10-30 ug/mL  may be an effective concentration for many patients. However, some  are best treated at concentrations outside of this range. Acetaminophen concentrations >150 ug/mL at 4  hours after ingestion  and >50 ug/mL at 12 hours after ingestion are often associated with  toxic reactions. Performed at [facility], [address] [place], [place] [address]   Salicylate level     Status: None   Collection Time: 04/01/18  9:47 PM  Result Value Ref Range   Salicylate Lvl <4.3 2.8 - 30.0 mg/dL    Comment: Performed at [facility], [address] [place], [place] [address]  Urine Drug Screen, Qualitative [facility] only)     Status: Abnormal   Collection Time: 04/01/18  9:49 PM  Result Value Ref Range   Tricyclic, Ur Screen POSITIVE (A) NONE DETECTED   Amphetamines, Ur Screen NONE DETECTED NONE DETECTED   MDMA (Ecstasy)Ur Screen NONE DETECTED NONE DETECTED   Cocaine Metabolite,Ur [place] NONE DETECTED NONE DETECTED   Opiate, Ur Screen NONE DETECTED NONE DETECTED   Phencyclidine (PCP) Ur S NONE DETECTED NONE DETECTED   Cannabinoid 50 Ng, Ur [place] NONE DETECTED NONE DETECTED   Barbiturates, Ur Screen (A) NONE DETECTED    Result not available. Reagent lot number recalled by manufacturer.   Benzodiazepine, Ur Scrn POSITIVE (A) NONE DETECTED   Methadone Scn, Ur NONE DETECTED NONE DETECTED    Comment: (NOTE) Tricyclics + metabolites, urine    Cutoff 1000 ng/mL Amphetamines + metabolites, urine  Cutoff 1000 ng/mL MDMA (Ecstasy), urine              Cutoff 500 ng/mL Cocaine Metabolite, urine  Cutoff 300 ng/mL Opiate + metabolites, urine        Cutoff 300 ng/mL Phencyclidine (PCP), urine         Cutoff 25 ng/mL Cannabinoid, urine                 Cutoff 50 ng/mL Barbiturates + metabolites, urine  Cutoff 200 ng/mL Benzodiazepine, urine              Cutoff 200 ng/mL Methadone, urine                   Cutoff 300 ng/mL The urine drug screen provides only a preliminary, unconfirmed analytical test result and should not be used for non-medical purposes. Clinical consideration and professional judgment should be applied to any positive drug screen result due  to possible interfering substances. A more specific alternate chemical method must be used in order to obtain a confirmed analytical result. Gas chromatography / mass spectrometry (GC/MS) is the preferred confirmat ory method. Performed at [facility], [address] [place], [place] [address]   Urinalysis, Complete w Microscopic     Status: Abnormal   Collection Time: 04/01/18  9:56 PM  Result Value Ref Range   Color, Urine YELLOW (A) YELLOW   APPearance CLEAR (A) CLEAR   Specific Gravity, Urine 1.019 1.005 - 1.030   pH 6.0 5.0 - 8.0   Glucose, UA NEGATIVE NEGATIVE mg/dL   Hgb urine dipstick NEGATIVE NEGATIVE   Bilirubin Urine NEGATIVE NEGATIVE   Ketones, ur NEGATIVE NEGATIVE mg/dL   Protein, ur NEGATIVE NEGATIVE mg/dL   Nitrite NEGATIVE NEGATIVE   Leukocytes, UA NEGATIVE NEGATIVE   RBC / HPF 0-5 0 - 5 RBC/hpf   WBC, UA 0-5 0 - 5 WBC/hpf   Bacteria, UA NONE SEEN NONE SEEN   Squamous Epithelial / LPF NONE SEEN 0 - 5    Comment: Performed at [facility], [address] [place], [place] [address]  Blood culture (routine x 2)     Status: None (Preliminary result)   Collection Time: 04/01/18  9:56 PM  Result Value Ref Range   Specimen Description BLOOD RIGHT AC    Special Requests      BOTTLES DRAWN AEROBIC AND ANAEROBIC Blood Culture adequate volume   Culture      NO GROWTH 2 DAYS Performed at [facility], [address] [place], [place] [address]    Report Status PENDING   Blood culture (routine x 2)     Status: None (Preliminary result)   Collection Time: 04/01/18  9:56 PM  Result Value Ref Range   Specimen Description BLOOD LEFT WRIST    Special Requests      BOTTLES DRAWN AEROBIC AND ANAEROBIC Blood Culture results may not be optimal due to an excessive volume of blood received in culture bottles   Culture      NO GROWTH 2 DAYS Performed at [facility], [address] [place], [place] [address]    Report Status PENDING    Glucose, capillary     Status: Abnormal   Collection Time: 04/02/18  1:07 AM  Result Value Ref Range   Glucose-Capillary 105 (H) 70 - 99 mg/dL   Comment 1 Notify RN    Comment 2 Document in Chart   Blood gas, arterial     Status: Abnormal   Collection Time: 04/02/18  3:00 AM  Result Value Ref Range   FIO2 70.00    Delivery systems VENTILATOR  Mode PRESSURE REGULATED VOLUME CONTROL    VT 500.0 mL   Peep/cpap 5.0 cm H20   pH, Arterial 7.38 7.350 - 7.450   pCO2 arterial 31 (L) 32.0 - 48.0 mmHg   pO2, Arterial 333 (H) 83.0 - 108.0 mmHg   Bicarbonate 17.5 (L) 20.0 - 28.0 mmol/L   Acid-base deficit 7.1 (H) 0.0 - 2.0 mmol/L   O2 Saturation 99.9 %   Patient temperature 35.5    Collection site REVIEWED BY    Sample type ARTERIAL DRAW    Allens test (pass/fail) PASS PASS   Mechanical Rate 16.0     Comment: Performed at [facility], [address] [place], [place] [address]  MRSA PCR Screening     Status: Abnormal   Collection Time: 04/02/18  3:11 AM  Result Value Ref Range   MRSA by PCR POSITIVE (A) NEGATIVE    Comment:        The GeneXpert MRSA Assay (FDA approved for NASAL specimens only), is one component of a comprehensive MRSA colonization surveillance program. It is not intended to diagnose MRSA infection nor to guide or monitor treatment for MRSA infections. c/ DALE HOPKINS @0440  04/02/18 [facility]LC Performed at [facility], [address] [place], [place] [address]   Lactic acid, plasma     Status: None   Collection Time: 04/02/18  6:09 AM  Result Value Ref Range   Lactic Acid, Venous 0.7 0.5 - 1.9 mmol/L    Comment: Performed at [facility], [address] [place], [place] [address]  Ammonia     Status: None   Collection Time: 04/02/18  6:09 AM  Result Value Ref Range   Ammonia 27 9 - 35 umol/L    Comment: Performed at [facility], [address] [place], [place] [address]  Magnesium     Status: None   Collection Time:  04/02/18  6:09 AM  Result Value Ref Range   Magnesium 1.8 1.7 - 2.4 mg/dL    Comment: Performed at [facility], [address] [place], [place] [address]  CBC     Status: Abnormal   Collection Time: 04/02/18  6:09 AM  Result Value Ref Range   WBC 7.2 3.6 - 11.0 K/uL   RBC 3.32 (L) 3.80 - 5.20 MIL/uL   Hemoglobin 9.1 (L) 12.0 - 16.0 g/dL   HCT 28.0 (L) 35.0 - 47.0 %   MCV 84.3 80.0 - 100.0 fL   MCH 27.3 26.0 - 34.0 pg   MCHC 32.4 32.0 - 36.0 g/dL   RDW 17.8 (H) 11.5 - 14.5 %   Platelets 241 150 - 440 K/uL    Comment: Performed at [facility], [address], [place], [place] [address]  Basic metabolic panel     Status: Abnormal   Collection Time: 04/02/18  6:09 AM  Result Value Ref Range   Sodium 134 (L) 135 - 145 mmol/L   Potassium 3.9 3.5 - 5.1 mmol/L   Chloride 111 98 - 111 mmol/L    Comment: Please note change in reference range.   CO2 17 (L) 22 - 32 mmol/L   Glucose, Bld 111 (H) 70 - 99 mg/dL    Comment: Please note change in reference range.   BUN 17 8 - 23 mg/dL    Comment: Please note change in reference range.   Creatinine, Ser 0.65 0.44 - 1.00 mg/dL   Calcium 8.2 (L) 8.9 - 10.3 mg/dL   GFR calc non Af Amer >  60 >60 mL/min   GFR calc Af Amer >60 >60 mL/min    Comment: (NOTE) The eGFR has been calculated using the CKD EPI equation. This calculation has not been validated in all clinical situations. eGFR's persistently <60 mL/min signify possible Chronic Kidney Disease.    Anion gap 6 5 - 15    Comment: Performed at [facility], [address] [place], [place] [address]  Troponin I     Status: None   Collection Time: 04/02/18  6:09 AM  Result Value Ref Range   Troponin I <0.03 <0.03 ng/mL    Comment: Performed at [facility], [address] [place], [place] [address]  Triglycerides     Status: None   Collection Time: 04/02/18  6:09 AM  Result Value Ref Range   Triglycerides 112 <150 mg/dL    Comment: Performed at  [facility], [address] [place], [place] [address]  Phosphorus     Status: None   Collection Time: 04/02/18  6:09 AM  Result Value Ref Range   Phosphorus 3.4 2.5 - 4.6 mg/dL    Comment: Performed at [facility], [address] [place], [place] [address]  Glucose, capillary     Status: Abnormal   Collection Time: 04/02/18  8:01 AM  Result Value Ref Range   Glucose-Capillary 127 (H) 70 - 99 mg/dL  Glucose, capillary     Status: Abnormal   Collection Time: 04/02/18 11:30 AM  Result Value Ref Range   Glucose-Capillary 111 (H) 70 - 99 mg/dL  Glucose, capillary     Status: None   Collection Time: 04/02/18  4:39 PM  Result Value Ref Range   Glucose-Capillary 91 70 - 99 mg/dL  Glucose, capillary     Status: None   Collection Time: 04/02/18  7:53 PM  Result Value Ref Range   Glucose-Capillary 97 70 - 99 mg/dL  Glucose, capillary     Status: None   Collection Time: 04/03/18 12:27 AM  Result Value Ref Range   Glucose-Capillary 87 70 - 99 mg/dL  Glucose, capillary     Status: None   Collection Time: 04/03/18  3:37 AM  Result Value Ref Range   Glucose-Capillary 97 70 - 99 mg/dL  Glucose, capillary     Status: None   Collection Time: 04/03/18  7:57 AM  Result Value Ref Range   Glucose-Capillary 79 70 - 99 mg/dL   Comment 1 Notify RN   Basic metabolic panel     Status: Abnormal   Collection Time: 04/03/18 10:56 AM  Result Value Ref Range   Sodium 140 135 - 145 mmol/L   Potassium 4.2 3.5 - 5.1 mmol/L   Chloride 116 (H) 98 - 111 mmol/L    Comment: Please note change in reference range.   CO2 19 (L) 22 - 32 mmol/L   Glucose, Bld 80 70 - 99 mg/dL    Comment: Please note change in reference range.   BUN 11 8 - 23 mg/dL    Comment: Please note change in reference range.   Creatinine, Ser 0.70 0.44 - 1.00 mg/dL   Calcium 8.7 (L) 8.9 - 10.3 mg/dL   GFR calc non Af Amer >60 >60 mL/min   GFR calc Af Amer >60 >60 mL/min    Comment: (NOTE) The eGFR has been  calculated using the CKD EPI equation. This calculation has not been validated in all clinical situations. eGFR's persistently <60 mL/min signify possible Chronic Kidney Disease.  Anion gap 5 5 - 15    Comment: Performed at [facility], [address] [place], [place] [address]  Glucose, capillary     Status: Abnormal   Collection Time: 04/03/18 11:35 AM  Result Value Ref Range   Glucose-Capillary 65 (L) 70 - 99 mg/dL  Glucose, capillary     Status: Abnormal   Collection Time: 04/03/18  4:56 PM  Result Value Ref Range   Glucose-Capillary 63 (L) 70 - 99 mg/dL   Comment 1 Notify RN   Glucose, capillary     Status: Abnormal   Collection Time: 04/03/18  5:44 PM  Result Value Ref Range   Glucose-Capillary 180 (H) 70 - 99 mg/dL    Current Facility-Administered Medications  Medication Dose Route Frequency Provider Last Rate Last Dose  . 0.9 %  sodium chloride infusion  250 mL Intravenous PRN Lance Coon, MD      . acetaminophen (TYLENOL) tablet 650 mg  650 mg Oral Q4H PRN Lance Coon, MD      . Chlorhexidine Gluconate Cloth 2 % PADS 6 each  6 each Topical Q0600 Conforti, Lamark Schue, DO   6 each at 04/03/18 1435  . dexmedetomidine (PRECEDEX) 400 MCG/100ML (4 mcg/mL) infusion  0.4-1.2 mcg/kg/hr Intravenous Titrated Demetrios Loll, MD 15.3 mL/hr at 04/03/18 1500 1.2 mcg/kg/hr at 04/03/18 1500  . dextrose 5 %-0.45 % sodium chloride infusion   Intravenous Continuous Conforti, Samentha Perham, DO 50 mL/hr at 04/03/18 1802    . enoxaparin (LOVENOX) injection 40 mg  40 mg Subcutaneous Q24H Lance Coon, MD   40 mg at 04/02/18 2201  . LORazepam (ATIVAN) injection 1-2 mg  1-2 mg Intravenous Q4H PRN Awilda Bill, NP   2 mg at 04/03/18 1800  . mupirocin ointment (BACTROBAN) 2 % 1 application  1 application Nasal BID Conforti, Treyvion Durkee, DO   1 application at 48/27/07 0944  . sennosides (SENOKOT) 8.8 MG/5ML syrup 5 mL  5 mL Per Tube BID PRN Awilda Bill, NP      . ziprasidone (GEODON) injection 10 mg   10 mg Intramuscular Q4H PRN Conforti, Schyler Butikofer, DO   10 mg at 04/03/18 1127    Musculoskeletal: Strength & Muscle Tone: decreased and atrophy Gait & Station: unable to stand Patient leans: N/A  Psychiatric Specialty Exam: Physical Exam  Nursing note and vitals reviewed. Constitutional: She appears well-developed.  HENT:  Head: Normocephalic and atraumatic.  Eyes: Pupils are equal, round, and reactive to light. Conjunctivae are normal.  Neck: Normal range of motion.  Cardiovascular: Normal heart sounds.  Respiratory: Effort normal.  GI: Soft.  Musculoskeletal: Normal range of motion.  Skin: Skin is warm and dry.  Psychiatric: Her affect is labile. Her speech is tangential. She is agitated. She is not aggressive. Thought content is not paranoid. Cognition and memory are impaired. She expresses impulsivity and inappropriate judgment. She expresses suicidal ideation. She expresses no homicidal ideation. She is noncommunicative. She exhibits abnormal recent memory and abnormal remote memory.    Review of Systems  Unable to perform ROS: Mental status change    Blood pressure 129/85, pulse 94, temperature (!) 97 F (36.1 C), temperature source Axillary, resp. rate 19, height 5' 6"  (1.676 m), weight 50.9 kg (112 lb 3.4 oz), SpO2 100 %.Body mass index is 18.11 kg/m.  General Appearance: Disheveled  Eye Contact:  None  Speech:  Garbled and Slurred  Volume:  Decreased  Mood:  Negative  Affect:  Negative  Thought Process:  NA  Orientation:  Negative  Thought Content:  Negative  Suicidal Thoughts:  Yes.  with intent/plan  Homicidal Thoughts:  No  Memory:  Negative  Judgement:  Negative  Insight:  Negative  Psychomotor Activity:  Negative  Concentration:  Concentration: Poor  Recall:  Negative  Fund of Knowledge:  Negative  Language:  Negative  Akathisia:  Negative  Handed:  Right  AIMS (if indicated):     Assets:  Housing  ADL's:  Impaired  Cognition:  Impaired,  Severe  Sleep:         Treatment Plan Summary: Plan Patient continues to be delirious.  Precedex drip.  Nothing needs to be done with psychiatric intervention at this point but continue the involuntary commitment.  Disposition: Recommend psychiatric Inpatient admission when medically cleared.  Alethia Berthold, MD 04/03/2018 6:33 PM

## 2018-04-03 NOTE — Progress Notes (Signed)
Follow up - Critical Care Medicine Note  Patient Details:    Diana West is an 62 y.o. female admitted with intentional drug overdose of amitriptyline, gabapentin, and sertraline requiring mechanical intubation for airway protection   Lines, Airways, Drains: Urethral Catheter Diana West tech Latex 16 Fr. (Active)  Indication for Insertion or Continuance of Catheter Unstable critical patients (first 24-48 hours) 04/03/2018  8:02 AM  Site Assessment Clean;Intact 04/03/2018  8:02 AM  Catheter Maintenance Bag below level of bladder;Drainage bag/tubing not touching floor;Catheter secured;Insertion date on drainage bag;No dependent loops;Seal intact 04/03/2018  8:02 AM  Collection Container Standard drainage bag 04/03/2018  8:02 AM  Securement Method Securing device (Describe) 04/03/2018  8:02 AM  Output (mL) 400 mL 04/02/2018  6:10 PM    Anti-infectives:  Anti-infectives (From admission, onward)   None      Microbiology: Results for orders placed or performed during the hospital encounter of 04/01/18  Blood culture (routine x 2)     Status: None (Preliminary result)   Collection Time: 04/01/18  9:56 PM  Result Value Ref Range Status   Specimen Description BLOOD RIGHT [facility]  Final   Special Requests   Final    BOTTLES DRAWN AEROBIC AND ANAEROBIC Blood Culture adequate volume   Culture   Final    NO GROWTH 2 DAYS Performed at [facility], [address], [place], [place] [address]    Report Status PENDING  Incomplete  Blood culture (routine x 2)     Status: None (Preliminary result)   Collection Time: 04/01/18  9:56 PM  Result Value Ref Range Status   Specimen Description BLOOD LEFT WRIST  Final   Special Requests   Final    BOTTLES DRAWN AEROBIC AND ANAEROBIC Blood Culture results may not be optimal due to an excessive volume of blood received in culture bottles   Culture   Final    NO GROWTH 2 DAYS Performed at [facility], [address]  [place], [place] [address]    Report Status PENDING  Incomplete  MRSA PCR Screening     Status: Abnormal   Collection Time: 04/02/18  3:11 AM  Result Value Ref Range Status   MRSA by PCR POSITIVE (A) NEGATIVE Final    Comment:        The GeneXpert MRSA Assay (FDA approved for NASAL specimens only), is one component of a comprehensive MRSA colonization surveillance program. It is not intended to diagnose MRSA infection nor to guide or monitor treatment for MRSA infections. c/ DALE HOPKINS @0440  04/02/18 [facility] Performed at [facility], [address], [place],  [address]     Studies: Ct Head Wo Contrast  Result Date: 04/01/2018 CLINICAL DATA:  Altered level of consciousness, medication overdose. EXAM: CT HEAD WITHOUT CONTRAST TECHNIQUE: Contiguous axial images were obtained from the base of the skull through the vertex without intravenous contrast. COMPARISON:  03/14/2018 FINDINGS: Brain: Involutional changes of the brain, age related without acute intracranial hemorrhage, midline shift or edema. No large vascular territory infarct. Chronic minimal small vessel ischemic disease of periventricular white matter. Midline fourth ventricle and basal cisterns without effacement. No hydrocephalus. Cerebellum and brainstem are unremarkable. No intra-axial mass nor extra-axial collections. Vascular: No hyperdense vessel sign. Skull: Intact Sinuses/Orbits: Nonacute Other: None IMPRESSION: Age related mild involutional changes of the brain with chronic small vessel ischemic disease. No acute intracranial abnormality. Electronically Signed   By: Ashley Royalty M.D.   On: 04/01/2018 23:32   Ct Head  Wo Contrast  Result Date: 03/14/2018 CLINICAL DATA:  Paranoia and agitation.  History of anxiety and TIA. EXAM: CT HEAD WITHOUT CONTRAST TECHNIQUE: Contiguous axial images were obtained from the base of the skull through the vertex without intravenous contrast. COMPARISON:  CT head 02/12/2018. FINDINGS:  Despite efforts by the technologist and patient, mild motion artifact is present on today's exam and could not be eliminated. This reduces exam sensitivity and specificity. Some images were repeated. Brain: There is no evidence of acute intracranial hemorrhage, mass lesion, brain edema or extra-axial fluid collection. The ventricles and subarachnoid spaces are appropriately sized for age. There is no CT evidence of acute cortical infarction. Vascular: Mild intracranial atherosclerosis. No hyperdense vessel identified. Skull: Negative for fracture or focal lesion. Sinuses/Orbits: The visualized paranasal sinuses and mastoid air cells are clear. No orbital abnormalities are seen. Other: None. IMPRESSION: Unremarkable noncontrast head CT. No significant change from recent prior study. Electronically Signed   By: Richardean Sale M.D.   On: 03/14/2018 16:44   Dg Chest Portable 1 View  Result Date: 04/01/2018 CLINICAL DATA:  Intubated EXAM: PORTABLE CHEST 1 VIEW COMPARISON:  02/12/2018 chest radiograph. FINDINGS: Endotracheal tube tip is 2.4 cm above the carina. Enteric tube terminates in the proximal stomach. Stable cardiomediastinal silhouette with normal heart size. No pneumothorax. No pleural effusion. Lungs appear clear, with no acute consolidative airspace disease and no pulmonary edema. Cholecystectomy clips are seen in the right upper quadrant of the abdomen. IMPRESSION: 1. Well-positioned endotracheal tube. Enteric tube terminates in the proximal stomach. 2. No active cardiopulmonary disease. Electronically Signed   By: Ilona Sorrel M.D.   On: 04/01/2018 22:29    Consults: Treatment Team:  Pccm, Ander Gaster, MD Clapacs, Madie Reno, MD   Subjective:    Overnight Issues: patient with continued delirium/encephalopathy. Required Precedex infusion all night. Presently confused, agitated and delirious  Objective:  Vital signs for last 24 hours: Temp:  [96.6 F (35.9 C)-99.3 F (37.4 C)] 97.7 F (36.5  C) (06/28 0802) Pulse Rate:  [58-80] 72 (06/28 1000) Resp:  [12-25] 21 (06/28 1000) BP: (91-145)/(50-105) 113/63 (06/28 1000) SpO2:  [90 %-100 %] 100 % (06/28 1000) Weight:  [112 lb 3.4 oz (50.9 kg)] 112 lb 3.4 oz (50.9 kg) (06/28 0500)  Hemodynamic parameters for last 24 hours:    Intake/Output from previous day: 06/27 0701 - 06/28 0700 In: 1983.3 [I.V.:1883.3; IV Piggyback:100] Out: 2425 [Urine:2425]  Intake/Output this shift: Total I/O In: 263.9 [I.V.:263.9] Out: -   Vent settings for last 24 hours:    Physical Exam:  Patient shaking head back and forth does not follow commands Vital signs: Please see the above listed vital signs Cardiovascular: Regular rate and rhythm Pulmonary: Clear to auscultation Abdominal: Positive bowel sounds Extremities: No clubbing cyanosis or edema noted Neurologic: Patient moves extremities, limited exam  Assessment/Plan:   Intentional overdose.patient status post ingestion of amitriptyline, gabapentin and sertraline, unknown amounts. Initially was intubated for airway protection, status post extubation. Still continues to be encephalopathic on Precedex infusion. Patient being started on Seroquel Appreciate psychiatry and neurology input. Will require inpatient treatment when medically stable. We'll try to wean Precedex as tolerated, continue ICU level of care  Anemia. No evidence of active bleeding  Hyponatremia. On saline replacement  Non-anion gap metabolic acidosis. Most likely secondary to saline resuscitation.  Will obtain BMP, CBC, continue ICU level of care      Critical Care Total Time 35 minutes  Niala Stcharles 04/03/2018  *Care during the described time interval  was provided by me and/or other providers on the critical care team.  I have reviewed this patient's available data, including medical history, events of note, physical examination and test results as part of my evaluation.

## 2018-04-03 NOTE — Progress Notes (Signed)
   [place] [facility] [place] NAME: Diana West    MR#:  [id number]  DATE OF BIRTH:  1956-05-16  SUBJECTIVE:  CHIEF COMPLAINT:   Chief Complaint  Patient presents with  . Drug Overdose   The patient is agitated, on Precedex.  She is on IVC. REVIEW OF SYSTEMS:  Review of Systems  Unable to perform ROS: Medical condition   DRUG ALLERGIES:   Allergies  Allergen Reactions  . Codeine Itching and Rash  . Toradol [Ketorolac Tromethamine] Rash   VITALS:  Blood pressure (!) 143/104, pulse 72, temperature (!) 97 F (36.1 C), temperature source Axillary, resp. rate 17, height 5\' 6"  (1.676 m), weight 112 lb 3.4 oz (50.9 kg), SpO2 100 %. PHYSICAL EXAMINATION:  Physical Exam  Constitutional:  Severe malnutrition.  HENT:  Head: Normocephalic.  Eyes: Pupils are equal, round, and reactive to light. Conjunctivae are normal. No scleral icterus.  Neck: Neck supple. No JVD present. No tracheal deviation present.  Cardiovascular: Normal rate, regular rhythm and normal heart sounds. Exam reveals no gallop.  No murmur heard. Pulmonary/Chest: Effort normal and breath sounds normal. No respiratory distress. She has no wheezes. She has no rales.  Abdominal: Soft. Bowel sounds are normal. She exhibits no distension. There is no tenderness. There is no rebound.  Musculoskeletal: She exhibits no edema or tenderness.  Neurological:  The patient is agtated, unable to exam.  Skin: No rash noted. No erythema.   LABORATORY PANEL:  Female CBC Recent Labs  Lab 04/02/18 0609  WBC 7.2  HGB 9.1*  HCT 28.0*  PLT 241   ------------------------------------------------------------------------------------------------------------------ Chemistries  Recent Labs  Lab 04/01/18 2147 04/02/18 0609 04/03/18 1056  NA 137 134* 140  K 3.3* 3.9 4.2  CL 113* 111 116*  CO2 19* 17* 19*  GLUCOSE 104* 111* 80  BUN 17 17 11   CREATININE 0.93 0.65 0.70  CALCIUM 8.1* 8.2*  8.7*  MG  --  1.8  --   AST 15  --   --   ALT 8  --   --   ALKPHOS 56  --   --   BILITOT 0.3  --   --    RADIOLOGY:  No results found. ASSESSMENT AND PLAN:   Suicide attempt by multiple drug overdose The patient was intubated due to respiratory compromise from overdose.   She was extubated but is agitated and put on Precedex.  She is on IVC.    Depression with anxiety -holding all home meds at this time, psych consult as above   Hypothyroidism -continue home dose thyroid replacement with the patient is able to take p.o.  Severe malnutrition.  Dietitian consult when the patient is stable.  I discussed with Dr. Jefferson Fuel. All the records are reviewed and case discussed with Care Management/Social Worker. Management plans discussed with the patient, family and they are in agreement.  CODE STATUS: Full Code  TOTAL TIME TAKING CARE OF THIS PATIENT: 33 minutes.  More than 50% of the time was spent in counseling/coordination of care: YES  POSSIBLE D/C IN 2-3 DAYS, DEPENDING ON CLINICAL CONDITION.    Demetrios Loll M.D on 04/03/2018 at 2:15 PM  Between 7am to 6pm - Pager - [phone]  After 6pm go to [email or web address] - [facility]

## 2018-04-03 NOTE — Clinical Social Work Note (Signed)
CSW was contacted by APS worker Anselm Jungling, [phone], she said patient is currently being followed by APS.  CSW updated APS on which room patient is in, because they are on their way to the hospital to speak with patient.  Jones Broom. [place], MSW, [facility]  04/03/2018 2:01 PM

## 2018-04-03 NOTE — Progress Notes (Signed)
Blood glucose rechecked and is 180.  RN made Dr. Jefferson Fuel aware of low blood glucose and that patient refuses to drink anything.  MD gave order for d5 0.45% NS @ 50 cc/H.

## 2018-04-04 DIAGNOSIS — F411 Generalized anxiety disorder: Secondary | ICD-10-CM

## 2018-04-04 DIAGNOSIS — F333 Major depressive disorder, recurrent, severe with psychotic symptoms: Secondary | ICD-10-CM

## 2018-04-04 LAB — CBC
HCT: 28.2 % — ABNORMAL LOW (ref 35.0–47.0)
Hemoglobin: 9.2 g/dL — ABNORMAL LOW (ref 12.0–16.0)
MCH: 27.2 pg (ref 26.0–34.0)
MCHC: 32.6 g/dL (ref 32.0–36.0)
MCV: 83.3 fL (ref 80.0–100.0)
PLATELETS: 260 10*3/uL (ref 150–440)
RBC: 3.38 MIL/uL — ABNORMAL LOW (ref 3.80–5.20)
RDW: 17.4 % — ABNORMAL HIGH (ref 11.5–14.5)
WBC: 7.4 10*3/uL (ref 3.6–11.0)

## 2018-04-04 LAB — BASIC METABOLIC PANEL
Anion gap: 8 (ref 5–15)
BUN: 9 mg/dL (ref 8–23)
CALCIUM: 8.7 mg/dL — AB (ref 8.9–10.3)
CO2: 21 mmol/L — ABNORMAL LOW (ref 22–32)
CREATININE: 0.62 mg/dL (ref 0.44–1.00)
Chloride: 108 mmol/L (ref 98–111)
GFR calc Af Amer: >60 mL/min (ref >60)
GLUCOSE: 105 mg/dL — AB (ref 70–99)
POTASSIUM: 3.8 mmol/L (ref 3.5–5.1)
SODIUM: 137 mmol/L (ref 135–145)

## 2018-04-04 LAB — GLUCOSE, CAPILLARY
Glucose-Capillary: 93 mg/dL (ref 70–99)
Glucose-Capillary: 96 mg/dL (ref 70–99)
Glucose-Capillary: 89 mg/dL (ref 70–99)
Glucose-Capillary: 82 mg/dL (ref 70–99)
Glucose-Capillary: 112 mg/dL — ABNORMAL HIGH (ref 70–99)
Glucose-Capillary: 107 mg/dL — ABNORMAL HIGH (ref 70–99)

## 2018-04-04 LAB — MAGNESIUM: MAGNESIUM: 1.4 mg/dL — AB (ref 1.7–2.4)

## 2018-04-04 LAB — PHOSPHORUS: Phosphorus: 2.7 mg/dL (ref 2.5–4.6)

## 2018-04-04 MED ORDER — LORAZEPAM 0.5 MG PO TABS: 0.5000 mg | ORAL_TABLET | Freq: Every day | ORAL | Status: DC

## 2018-04-04 MED ORDER — RISPERIDONE 0.5 MG PO TABS
0.5000 mg | ORAL_TABLET | Freq: Every day | ORAL | Status: DC
Start: 2018-04-04 — End: 2018-04-05
  Administered 2018-04-04: 0.5 mg via ORAL
  Filled 2018-04-04 (×2): qty 1

## 2018-04-04 MED ORDER — MAGNESIUM SULFATE 2 GM/50ML IV SOLN
2.0000 g | Freq: Once | INTRAVENOUS | Status: AC
  Administered 2018-04-04: 2 g via INTRAVENOUS
  Filled 2018-04-04: qty 50

## 2018-04-04 MED ORDER — CITALOPRAM HYDROBROMIDE 20 MG PO TABS
20.0000 mg | ORAL_TABLET | Freq: Every day | ORAL | Status: DC
  Administered 2018-04-04 – 2018-04-05 (×2): 20 mg via ORAL
  Filled 2018-04-04 (×2): qty 1

## 2018-04-04 MED ORDER — LORAZEPAM 0.5 MG PO TABS
0.5000 mg | ORAL_TABLET | Freq: Two times a day (BID) | ORAL | Status: DC
  Administered 2018-04-04 – 2018-04-05 (×2): 0.5 mg via ORAL
  Filled 2018-04-04 (×2): qty 1

## 2018-04-04 MED ORDER — OXYCODONE-ACETAMINOPHEN 5-325 MG PO TABS
1.0000 | ORAL_TABLET | ORAL | Status: DC | PRN
  Administered 2018-04-04 – 2018-04-05 (×4): 1 via ORAL
  Filled 2018-04-04 (×4): qty 1

## 2018-04-04 MED ORDER — ORAL CARE MOUTH RINSE
15.0000 mL | Freq: Two times a day (BID) | OROMUCOSAL | Status: DC
  Administered 2018-04-04 – 2018-04-05 (×4): 15 mL via OROMUCOSAL

## 2018-04-04 NOTE — Progress Notes (Signed)
Paged oncall hospitalist re: pt request for additional pain medication. Pt provided tylenol prior to phone call. Pt states tylenol is not effective since she has "had so many falls". Pt educated MD would be paged. Awaiting orders.

## 2018-04-04 NOTE — Progress Notes (Signed)
Pt continues to progress throughout shift. Psych MD has not seen patient today, reported that she would return to bedside once patient was done on [facility]. Report given to [facility] RN, patient transfer to inpatient room accompanied by [facility] manager.

## 2018-04-04 NOTE — Progress Notes (Addendum)
Follow up - Critical Care Medicine Note  Patient Details:    Diana West is an 62 y.o. female admitted with intentional drug overdose of amitriptyline, gabapentin, and sertraline requiring mechanical intubation for airway protection   Lines, Airways, Drains: Urethral Catheter Stephanie tech Latex 16 Fr. (Active)  Indication for Insertion or Continuance of Catheter Unstable critical patients (first 24-48 hours) 04/03/2018  8:02 AM  Site Assessment Clean;Intact 04/03/2018  8:02 AM  Catheter Maintenance Bag below level of bladder;Drainage bag/tubing not touching floor;Catheter secured;Insertion date on drainage bag;No dependent loops;Seal intact 04/03/2018  8:02 AM  Collection Container Standard drainage bag 04/03/2018  8:02 AM  Securement Method Securing device (Describe) 04/03/2018  8:02 AM  Output (mL) 400 mL 04/02/2018  6:10 PM    Anti-infectives:  Anti-infectives (From admission, onward)   None      Microbiology: Results for orders placed or performed during the hospital encounter of 04/01/18  Blood culture (routine x 2)     Status: None (Preliminary result)   Collection Time: 04/01/18  9:56 PM  Result Value Ref Range Status   Specimen Description BLOOD RIGHT [facility]  Final   Special Requests   Final    BOTTLES DRAWN AEROBIC AND ANAEROBIC Blood Culture adequate volume   Culture   Final    NO GROWTH 3 DAYS Performed at [facility], [address] [place], [place] [address]    Report Status PENDING  Incomplete  Blood culture (routine x 2)     Status: None (Preliminary result)   Collection Time: 04/01/18  9:56 PM  Result Value Ref Range Status   Specimen Description BLOOD LEFT WRIST  Final   Special Requests   Final    BOTTLES DRAWN AEROBIC AND ANAEROBIC Blood Culture results may not be optimal due to an excessive volume of blood received in culture bottles   Culture   Final    NO GROWTH 3 DAYS Performed at [facility], [address],  [place], [place] [address]    Report Status PENDING  Incomplete  MRSA PCR Screening     Status: Abnormal   Collection Time: 04/02/18  3:11 AM  Result Value Ref Range Status   MRSA by PCR POSITIVE (A) NEGATIVE Final    Comment:        The GeneXpert MRSA Assay (FDA approved for NASAL specimens only), is one component of a comprehensive MRSA colonization surveillance program. It is not intended to diagnose MRSA infection nor to guide or monitor treatment for MRSA infections. c/ DALE HOPKINS @0440  04/02/18 [facility] Performed at [facility], [address] [place], [place] [address]     Studies: Ct Head Wo Contrast  Result Date: 04/01/2018 CLINICAL DATA:  Altered level of consciousness, medication overdose. EXAM: CT HEAD WITHOUT CONTRAST TECHNIQUE: Contiguous axial images were obtained from the base of the skull through the vertex without intravenous contrast. COMPARISON:  03/14/2018 FINDINGS: Brain: Involutional changes of the brain, age related without acute intracranial hemorrhage, midline shift or edema. No large vascular territory infarct. Chronic minimal small vessel ischemic disease of periventricular white matter. Midline fourth ventricle and basal cisterns without effacement. No hydrocephalus. Cerebellum and brainstem are unremarkable. No intra-axial mass nor extra-axial collections. Vascular: No hyperdense vessel sign. Skull: Intact Sinuses/Orbits: Nonacute Other: None IMPRESSION: Age related mild involutional changes of the brain with chronic small vessel ischemic disease. No acute intracranial abnormality. Electronically Signed   By: Ashley Royalty M.D.   On: 04/01/2018 23:32   Ct Head  Wo Contrast  Result Date: 03/14/2018 CLINICAL DATA:  Paranoia and agitation.  History of anxiety and TIA. EXAM: CT HEAD WITHOUT CONTRAST TECHNIQUE: Contiguous axial images were obtained from the base of the skull through the vertex without intravenous contrast. COMPARISON:  CT head 02/12/2018. FINDINGS:  Despite efforts by the technologist and patient, mild motion artifact is present on today's exam and could not be eliminated. This reduces exam sensitivity and specificity. Some images were repeated. Brain: There is no evidence of acute intracranial hemorrhage, mass lesion, brain edema or extra-axial fluid collection. The ventricles and subarachnoid spaces are appropriately sized for age. There is no CT evidence of acute cortical infarction. Vascular: Mild intracranial atherosclerosis. No hyperdense vessel identified. Skull: Negative for fracture or focal lesion. Sinuses/Orbits: The visualized paranasal sinuses and mastoid air cells are clear. No orbital abnormalities are seen. Other: None. IMPRESSION: Unremarkable noncontrast head CT. No significant change from recent prior study. Electronically Signed   By: Richardean Sale M.D.   On: 03/14/2018 16:44   Dg Chest Portable 1 View  Result Date: 04/01/2018 CLINICAL DATA:  Intubated EXAM: PORTABLE CHEST 1 VIEW COMPARISON:  02/12/2018 chest radiograph. FINDINGS: Endotracheal tube tip is 2.4 cm above the carina. Enteric tube terminates in the proximal stomach. Stable cardiomediastinal silhouette with normal heart size. No pneumothorax. No pleural effusion. Lungs appear clear, with no acute consolidative airspace disease and no pulmonary edema. Cholecystectomy clips are seen in the right upper quadrant of the abdomen. IMPRESSION: 1. Well-positioned endotracheal tube. Enteric tube terminates in the proximal stomach. 2. No active cardiopulmonary disease. Electronically Signed   By: Ilona Sorrel M.D.   On: 04/01/2018 22:29    Consults: Treatment Team:  Pccm, Ander Gaster, MD Clapacs, Madie Reno, MD   Subjective:    Overnight Issues: patient with continued delirium/encephalopathy. Required Precedex infusion all night. Presently confused, agitated and delirious  Objective:  Vital signs for last 24 hours: Temp:  [96.7 F (35.9 C)-98.1 F (36.7 C)] 98.1 F (36.7  C) (06/29 0740) Pulse Rate:  [71-94] 72 (06/29 0800) Resp:  [10-33] 17 (06/29 0800) BP: (101-155)/(58-104) 135/74 (06/29 0800) SpO2:  [97 %-100 %] 99 % (06/29 0800) Weight:  [105 lb 13.1 oz (48 kg)] 105 lb 13.1 oz (48 kg) (06/29 0429)  Hemodynamic parameters for last 24 hours:    Intake/Output from previous day: 06/28 0701 - 06/29 0700 In: 1821.4 [I.V.:1821.4] Out: 2390 [Urine:2390]  Intake/Output this shift: Total I/O In: 53.3 [I.V.:53.3] Out: 150 [Urine:150]  Vent settings for last 24 hours:    Physical Exam:  Patient shaking head back and forth does not follow commands Vital signs: Please see the above listed vital signs Cardiovascular: Regular rate and rhythm Pulmonary: Clear to auscultation Abdominal: Positive bowel sounds Extremities: No clubbing cyanosis or edema noted Neurologic: Patient moves extremities, limited exam  Assessment/Plan:   Intentional overdose.patient status post ingestion of amitriptyline, gabapentin and sertraline, unknown amounts. Initially was intubated for airway protection, status post extubation. Still continues to be encephalopathic on Precedex infusion. Patient being started on Seroquel Appreciate psychiatry and neurology input. Will require inpatient treatment when medically stable. We'll try to wean Precedex as tolerated, continue ICU level of care  Anemia. No evidence of active bleeding  Hyponatremia. On saline replacement  Hypomagnesemia. We'll replace  Critical Care Total Time 35 minutes  Diana West 04/04/2018  *Care during the described time interval was provided by me and/or other providers on the critical care team.  I have reviewed this patient's available data, including medical history,  events of note, physical examination and test results as part of my evaluation. Patient ID: Melida Quitter, female   DOB: [birth year], 62 y.o.   MRN: [id number]

## 2018-04-04 NOTE — Consult Note (Signed)
[facility] Consult   Reason for Consult: Follow-up consult for 62 year old woman who made a suicide attempt by overdose of antidepressants Referring Physician: Bridgett Larsson Patient Identification: Diana West MRN:  [id number] Principal Diagnosis: Suicide attempt by multiple drug overdose [facility]) Diagnosis:   Patient Active Problem List   Diagnosis Date Noted  . Suicide attempt by multiple drug overdose ([place]) [T50.902A] 04/01/2018  . Hypothyroidism [E03.9] 04/01/2018  . Acute encephalopathy [G93.40] 03/17/2018  . Recurrent moderate major depressive disorder with anxiety ([place]) [F33.1, F41.9] 03/16/2018    Class: Acute  . Pressure injury of skin [L89.90] 03/16/2018  . Metabolic encephalopathy [B34.19] 03/16/2018  . Malnutrition of moderate degree [E44.0] 03/16/2018  . Acute UTI [N39.0] 02/13/2018  . Pancreatic cyst [K86.2] 11/12/2017  . Protein-calorie malnutrition, severe [E43] 07/23/2017  . Acute cystitis [N30.00] 07/22/2017  . TIA (transient ischemic attack) [G45.9] 07/06/2017  . Hypotension [I95.9] 06/26/2017  . Falls frequently [R29.6] 06/26/2017  . Anemia [D64.9] 06/26/2017  . Depression with anxiety [F41.8] 06/26/2017  . FH: premature coronary heart disease [Z82.49] 06/26/2017  . Lumbar disc disease [M51.9] 06/26/2017  . Cervical disc disease [M50.90] 06/26/2017  . Chronic headaches [R51] 06/26/2017  . History of PSVT (paroxysmal supraventricular tachycardia) [Z86.79] 06/26/2017  . Tinea corporis [B35.4] 06/26/2017  . Postherpetic neuralgia [B02.29] 06/26/2017  . CKD (chronic kidney disease) stage 3, GFR 30-59 ml/min (HCC) [N18.3] 06/26/2017    Total Time spent with patient: 20 minutes  Subjective:     Diana West is a 62 year old divorced Caucasian female with a history of recurrent major depression who was admitted to the ICU after she overdosed on a combination of Zoloft, amitriptyline and gabapentin and intentional suicide attempt. The patient says  that she took pills to try and end her life because she felt "so bad" and really depressed over the past 3 months. She says she was feeling alone and it had some conflict with the females that live at her condo complex. She feels like she had just given up on life. The patient's son also reports that she had been diagnosed with pancreatic at the oncology on July 3 to clarify the diagnosis. The patient reports that her son wanted her to move into an assisted living facility she did not want to do. She does endorse feelings of hopelessness and sadness, anhedonia and low energy level of the past several months. She denies any problems with insomnia but has had low appetite. She says the only other suicide attempt was over 30 years ago. She denies any history of significant bipolar mania including grandiose delusions, racing thoughts or decreased sleep with increased cutting behavior. While she denies any psychotic symptoms, her son says that she has been having paranoid thoughts recently, calling the police frequently and then accusing her neighbors of things. She denies  any history of any heavy alcohol use or illicit drug use.   Past psychiatric history The patient denies any prior inpatient psychiatric hospitalizations but has been followed by psychiatrist, Dr. Raul Del in [place] for many years. She was diagnosed with depression but denies any other psychiatric diagnosis. She denies currently seeing a therapist. She has had one prior suicide attempt in the past  Family psychiatric history: Her mother struggle with depression  Social history: The patient is currently divorced and she has 2 sons, one that lives in [place] and one that lives in [place]. She is retired.  Legal history: She says she was arrested many years ago for failure to appear in court  Risk to Self:   Yes Risk to Others:  No Prior Inpatient Therapy:   No Prior Outpatient Therapy:  Yes  Past Medical History:  Past  Medical History:  Diagnosis Date  . Anxiety   . Anxiety   . Arthritis   . Cardiac arrhythmia   . Depression   . Falls frequently   . Hypothyroidism   . Palpitation   . Shingles    she states "they are inside and not contagious"  . TIA (transient ischemic attack)     Past Surgical History:  Procedure Laterality Date  . ABDOMINAL HYSTERECTOMY    . CESAREAN SECTION    . CHOLECYSTECTOMY     Family History:  Family History  Problem Relation Age of Onset  . CAD Mother   . CAD Father     Social History:  Social History   Substance and Sexual Activity  Alcohol Use No     Social History   Substance and Sexual Activity  Drug Use No    Social History   Socioeconomic History  . Marital status: Divorced    Spouse name: Not on file  . Number of children: Not on file  . Years of education: Not on file  . Highest education level: Not on file  Occupational History  . Not on file  Social Needs  . Financial resource strain: Not on file  . Food insecurity:    Worry: Not on file    Inability: Not on file  . Transportation needs:    Medical: Not on file    Non-medical: Not on file  Tobacco Use  . Smoking status: Current Some Day Smoker    Packs/day: 0.25    Types: Cigarettes  . Smokeless tobacco: Never Used  Substance and Sexual Activity  . Alcohol use: No  . Drug use: No  . Sexual activity: Not on file  Lifestyle  . Physical activity:    Days per week: Not on file    Minutes per session: Not on file  . Stress: Not on file  Relationships  . Social connections:    Talks on phone: Not on file    Gets together: Not on file    Attends religious service: Not on file    Active member of club or organization: Not on file    Attends meetings of clubs or organizations: Not on file    Relationship status: Not on file  Other Topics Concern  . Not on file  Social History Narrative   Lives at home by herself. Has a walker to ambulate   Additional Social History:     Allergies:   Allergies  Allergen Reactions  . Codeine Itching and Rash  . Toradol [Ketorolac Tromethamine] Rash    Labs:  Results for orders placed or performed during the hospital encounter of 04/01/18 (from the past 48 hour(s))  Glucose, capillary     Status: None   Collection Time: 04/02/18  7:53 PM  Result Value Ref Range   Glucose-Capillary 97 70 - 99 mg/dL  Glucose, capillary     Status: None   Collection Time: 04/03/18 12:27 AM  Result Value Ref Range   Glucose-Capillary 87 70 - 99 mg/dL  Glucose, capillary     Status: None   Collection Time: 04/03/18  3:37 AM  Result Value Ref Range   Glucose-Capillary 97 70 - 99 mg/dL  Glucose, capillary     Status: None   Collection Time: 04/03/18  7:57 AM  Result  Value Ref Range   Glucose-Capillary 79 70 - 99 mg/dL   Comment 1 Notify RN   Basic metabolic panel     Status: Abnormal   Collection Time: 04/03/18 10:56 AM  Result Value Ref Range   Sodium 140 135 - 145 mmol/L   Potassium 4.2 3.5 - 5.1 mmol/L   Chloride 116 (H) 98 - 111 mmol/L    Comment: Please note change in reference range.   CO2 19 (L) 22 - 32 mmol/L   Glucose, Bld 80 70 - 99 mg/dL    Comment: Please note change in reference range.   BUN 11 8 - 23 mg/dL    Comment: Please note change in reference range.   Creatinine, Ser 0.70 0.44 - 1.00 mg/dL   Calcium 8.7 (L) 8.9 - 10.3 mg/dL   GFR calc non Af Amer >60 >60 mL/min   GFR calc Af Amer >60 >60 mL/min    Comment: (NOTE) The eGFR has been calculated using the CKD EPI equation. This calculation has not been validated in all clinical situations. eGFR's persistently <60 mL/min signify possible Chronic Kidney Disease.    Anion gap 5 5 - 15    Comment: Performed at [facility], [address] [place], [place] [address]  Glucose, capillary     Status: Abnormal   Collection Time: 04/03/18 11:35 AM  Result Value Ref Range   Glucose-Capillary 65 (L) 70 - 99 mg/dL  Glucose, capillary     Status:  Abnormal   Collection Time: 04/03/18  4:56 PM  Result Value Ref Range   Glucose-Capillary 63 (L) 70 - 99 mg/dL   Comment 1 Notify RN   Glucose, capillary     Status: Abnormal   Collection Time: 04/03/18  5:44 PM  Result Value Ref Range   Glucose-Capillary 180 (H) 70 - 99 mg/dL  Glucose, capillary     Status: None   Collection Time: 04/03/18  8:04 PM  Result Value Ref Range   Glucose-Capillary 91 70 - 99 mg/dL  Glucose, capillary     Status: None   Collection Time: 04/03/18 11:42 PM  Result Value Ref Range   Glucose-Capillary 88 70 - 99 mg/dL  Glucose, capillary     Status: None   Collection Time: 04/04/18  4:33 AM  Result Value Ref Range   Glucose-Capillary 93 70 - 99 mg/dL  Basic metabolic panel     Status: Abnormal   Collection Time: 04/04/18  5:42 AM  Result Value Ref Range   Sodium 137 135 - 145 mmol/L   Potassium 3.8 3.5 - 5.1 mmol/L   Chloride 108 98 - 111 mmol/L    Comment: Please note change in reference range.   CO2 21 (L) 22 - 32 mmol/L   Glucose, Bld 105 (H) 70 - 99 mg/dL    Comment: Please note change in reference range.   BUN 9 8 - 23 mg/dL    Comment: Please note change in reference range.   Creatinine, Ser 0.62 0.44 - 1.00 mg/dL   Calcium 8.7 (L) 8.9 - 10.3 mg/dL   GFR calc non Af Amer >60 >60 mL/min   GFR calc Af Amer >60 >60 mL/min    Comment: (NOTE) The eGFR has been calculated using the CKD EPI equation. This calculation has not been validated in all clinical situations. eGFR's persistently <60 mL/min signify possible Chronic Kidney Disease.    Anion gap 8 5 - 15    Comment: Performed at [facility], [address] [place], [place] [address]  CBC     Status: Abnormal   Collection Time: 04/04/18  5:42 AM  Result Value Ref Range   WBC 7.4 3.6 - 11.0 K/uL   RBC 3.38 (L) 3.80 - 5.20 MIL/uL   Hemoglobin 9.2 (L) 12.0 - 16.0 g/dL   HCT 28.2 (L) 35.0 - 47.0 %   MCV 83.3 80.0 - 100.0 fL   MCH 27.2 26.0 - 34.0 pg   MCHC 32.6 32.0 - 36.0 g/dL    RDW 17.4 (H) 11.5 - 14.5 %   Platelets 260 150 - 440 K/uL    Comment: Performed at [facility], [address] [place], [place] [address]  Magnesium     Status: Abnormal   Collection Time: 04/04/18  5:42 AM  Result Value Ref Range   Magnesium 1.4 (L) 1.7 - 2.4 mg/dL    Comment: Performed at [facility], [address] [place], [place] [address]  Phosphorus     Status: None   Collection Time: 04/04/18  5:42 AM  Result Value Ref Range   Phosphorus 2.7 2.5 - 4.6 mg/dL    Comment: Performed at [facility], [address] [place], [place] [address]  Glucose, capillary     Status: None   Collection Time: 04/04/18  7:37 AM  Result Value Ref Range   Glucose-Capillary 96 70 - 99 mg/dL  Glucose, capillary     Status: None   Collection Time: 04/04/18 12:31 PM  Result Value Ref Range   Glucose-Capillary 89 70 - 99 mg/dL  Glucose, capillary     Status: None   Collection Time: 04/04/18  4:07 PM  Result Value Ref Range   Glucose-Capillary 82 70 - 99 mg/dL    Current Facility-Administered Medications  Medication Dose Route Frequency Provider Last Rate Last Dose  . 0.9 %  sodium chloride infusion  250 mL Intravenous PRN Lance Coon, MD      . acetaminophen (TYLENOL) tablet 650 mg  650 mg Oral Q4H PRN Lance Coon, MD   650 mg at 04/04/18 1643  . Chlorhexidine Gluconate Cloth 2 % PADS 6 each  6 each Topical Q0600 Conforti, John, DO   6 each at 04/04/18 4536  . citalopram (CELEXA) tablet 20 mg  20 mg Oral Daily Chauncey Mann, MD      . dextrose 5 %-0.45 % sodium chloride infusion   Intravenous Continuous Conforti, John, DO 50 mL/hr at 04/04/18 1600    . enoxaparin (LOVENOX) injection 40 mg  40 mg Subcutaneous Q24H Lance Coon, MD   40 mg at 04/03/18 2135  . LORazepam (ATIVAN) injection 1-2 mg  1-2 mg Intravenous Q4H PRN Awilda Bill, NP   2 mg at 04/04/18 4680  . LORazepam (ATIVAN) tablet 0.5 mg  0.5 mg Oral BID Chauncey Mann, MD      . Derrill Memo ON  04/08/2018] LORazepam (ATIVAN) tablet 0.5 mg  0.5 mg Oral Daily Chauncey Mann, MD      . MEDLINE mouth rinse  15 mL Mouth Rinse BID Tukov-Yual, Magdalene S, NP   15 mL at 04/04/18 1000  . mupirocin ointment (BACTROBAN) 2 % 1 application  1 application Nasal BID Conforti, John, DO   1 application at 32/12/24 0957  . risperiDONE (RISPERDAL) tablet 0.5 mg  0.5 mg Oral QHS Chauncey Mann, MD      . sennosides (SENOKOT) 8.8 MG/5ML syrup 5 mL  5 mL Per Tube BID PRN Blakeney,  Dreama Saa, NP        Musculoskeletal: Strength & Muscle Tone: decreased and atrophy Gait & Station: unable to stand Patient leans: N/A  Psychiatric Specialty Exam: Physical Exam  Psychiatric: Her mood appears anxious. Her speech is rapid and/or pressured. She is hyperactive. Thought content is paranoid. Cognition and memory are impaired. She expresses impulsivity. She exhibits a depressed mood.    ROS: See hospitalist note  Blood pressure 121/77, pulse (!) 111, temperature 99 F (37.2 C), temperature source Oral, resp. rate 20, height 5' 6"  (1.676 m), weight 48 kg (105 lb 13.1 oz), SpO2 98 %.Body mass index is 17.08 kg/m.  General Appearance: Disheveled  Eye Contact:  None  Speech:  Clear and Coherent and Pressured  Volume:  Normal  Mood:  Anxious and Depressed  Affect:  Negative and Congruent  Thought Process:  tangential  Orientation:  Full (Time, Place, and Person)  Thought Content:  Logical  Suicidal Thoughts:  Passive SI  Homicidal Thoughts:  No  Memory:  Immediate;   Fair Recent;   Fair Remote;   Fair  Judgement:  Good  Insight:  Fair  Psychomotor Activity:  Negative  Concentration:  Concentration: Fair and Attention Span: Fair  Recall:  Negative  Fund of Knowledge:  Negative  Language:  Negative  Akathisia:  Negative  Handed:  Right  AIMS (if indicated):     Assets:  Housing  ADL's:  Impaired  Cognition:  Impaired,  Severe  Sleep:        Treatment Plan Summary:  DAIGNOSIS Major depressive  disorder, recurrent with psychosis Generalized Anxiety Disorder S/P Overdose  Ms. Salaz is a 62 year old divorced Caucasian female with history of recurrent depression and some mild psychosis her son who was admitted to the medical floor status post overdose in a suicide attempt. The patient overdosed on antidepressant medication secondary to conflict with other women at her condo complex. She also just recently found out that she may have pancreatic cancer. She denies any current active suicidal thoughts but does not regret the suicide attempt.   Major depressive disorder, recurrent with psychosis: We will plan to start patient on Celexa 20 mg by mouth daily no longer use Zoloft. Will plan to use Risperdal 0.5 mg by mouth nightly for psychosis. She will no longer use amitriptyline now she will be on Celexa and Risperdal. The patient will not be restarted on Neurontin and is unclear whether or not it was being used for pain or anxiety. We'll try to get collateral information from Dr. Michaelle Birks, the patient's outpatient psychiatrist on Monday. Please keep with 1:1 sitter for now  We'll continue supportive psychotherapy with follow-up visits. The patient will greatly benefit from individual therapy outside the hospital.  The patient will remain under IVC at this time and will pursue inpatient psychiatric hospitalization for medication management, safety and stabilization once medically cleared.    Disposition: Recommend psychiatric Inpatient admission when medically cleared.  Chauncey Mann, MD 04/04/2018 6:01 PM

## 2018-04-04 NOTE — Progress Notes (Signed)
[facility] [place] NAME: Diana West    MR#:  [id number]  DATE OF BIRTH:  [birth year]  SUBJECTIVE:  CHIEF COMPLAINT:   Chief Complaint  Patient presents with  . Drug Overdose   The patient is calm, on Precedex.  She is on IVC. REVIEW OF SYSTEMS:  Review of Systems  Unable to perform ROS: Medical condition   DRUG ALLERGIES:   Allergies  Allergen Reactions  . Codeine Itching and Rash  . Toradol [Ketorolac Tromethamine] Rash   VITALS:  Blood pressure 114/66, pulse 76, temperature 98.1 F (36.7 C), temperature source Axillary, resp. rate 17, height 5\' 6"  (1.676 m), weight 105 lb 13.1 oz (48 kg), SpO2 100 %. PHYSICAL EXAMINATION:  Physical Exam  Constitutional:  Severe malnutrition.  HENT:  Head: Normocephalic.  Eyes: Pupils are equal, round, and reactive to light. Conjunctivae are normal. No scleral icterus.  Neck: Neck supple. No JVD present. No tracheal deviation present.  Cardiovascular: Normal rate, regular rhythm and normal heart sounds. Exam reveals no gallop.  No murmur heard. Pulmonary/Chest: Effort normal and breath sounds normal. No respiratory distress. She has no wheezes. She has no rales.  Abdominal: Soft. Bowel sounds are normal. She exhibits no distension. There is no tenderness. There is no rebound.  Musculoskeletal: She exhibits no edema or tenderness.  Neurological:  The patient is confused, unable to exam.  Skin: No rash noted. No erythema.   LABORATORY PANEL:  Female CBC Recent Labs  Lab 04/04/18 0542  WBC 7.4  HGB 9.2*  HCT 28.2*  PLT 260   ------------------------------------------------------------------------------------------------------------------ Chemistries  Recent Labs  Lab 04/01/18 2147  04/04/18 0542  NA 137   < > 137  K 3.3*   < > 3.8  CL 113*   < > 108  CO2 19*   < > 21*  GLUCOSE 104*   < > 105*  BUN 17   < > 9  CREATININE 0.93   < > 0.62  CALCIUM 8.1*   < > 8.7*  MG  --     < > 1.4*  AST 15  --   --   ALT 8  --   --   ALKPHOS 56  --   --   BILITOT 0.3  --   --    < > = values in this interval not displayed.   RADIOLOGY:  No results found. ASSESSMENT AND PLAN:   Suicide attempt by multiple drug overdose The patient was intubated due to respiratory compromise from overdose.   She was extubated but was agitated and put on Precedex.  She is on IVC. Try to wean off sedation slowly.    Depression with anxiety -holding all home meds at this time, psych consult as above   Hypothyroidism -continue home dose thyroid replacement with the patient is able to take p.o.  Severe malnutrition.  Dietitian consult when the patient is stable.  Hypomagnesemia.  Magnesium supplement.  I discussed with Dr. Jefferson Fuel. All the records are reviewed and case discussed with Care Management/Social Worker. Management plans discussed with the patient, family and they are in agreement.  CODE STATUS: Full Code  TOTAL TIME TAKING CARE OF THIS PATIENT: 28 minutes.  More than 50% of the time was spent in counseling/coordination of care: YES  POSSIBLE D/C IN 2-3 DAYS, DEPENDING ON CLINICAL CONDITION.    Demetrios Loll M.D on 04/04/2018 at 1:16 PM  Between 7am to 6pm - Pager - [phone]  After 6pm go to [email or web address] - [facility]

## 2018-04-04 NOTE — Progress Notes (Addendum)
Patient lethargic/sleepy on precedex at beginning of shift. Weaned precedex slowly and patient now awake, calm, cooperative at midday, precedex off. Up to [facility] to have BM x 2 assist. Foley removed around 1420 with no void yet. Psych notified of patient's improvement, to come see patient soon. Son called and updated per patient request. Patient asking where purse was located, son confirmed that it is locked up at her apartment and was NOT brought to the hospital. Order to transfer to med-surg, awaiting room assignment. Sitter at bedside

## 2018-04-05 ENCOUNTER — Inpatient Hospital Stay
Admission: AD | Admit: 2018-04-05 | Discharge: 2018-04-09 | DRG: 885 | Disposition: A | Discharge status: Home / Self Care | Payer: Medicare Other | Attending: Psychiatry | Admitting: Psychiatry

## 2018-04-05 ENCOUNTER — Other Ambulatory Visit: Payer: Self-pay

## 2018-04-05 DIAGNOSIS — Z8673 Personal history of transient ischemic attack (TIA), and cerebral infarction without residual deficits: Secondary | ICD-10-CM

## 2018-04-05 DIAGNOSIS — R296 Repeated falls: Secondary | ICD-10-CM | POA: Diagnosis present

## 2018-04-05 DIAGNOSIS — F411 Generalized anxiety disorder: Secondary | ICD-10-CM | POA: Diagnosis present

## 2018-04-05 DIAGNOSIS — Z79890 Hormone replacement therapy (postmenopausal): Secondary | ICD-10-CM

## 2018-04-05 DIAGNOSIS — E43 Unspecified severe protein-calorie malnutrition: Secondary | ICD-10-CM | POA: Diagnosis present

## 2018-04-05 DIAGNOSIS — K862 Cyst of pancreas: Secondary | ICD-10-CM | POA: Diagnosis present

## 2018-04-05 DIAGNOSIS — Z90710 Acquired absence of both cervix and uterus: Secondary | ICD-10-CM | POA: Diagnosis not present

## 2018-04-05 DIAGNOSIS — J189 Pneumonia, unspecified organism: Secondary | ICD-10-CM | POA: Diagnosis present

## 2018-04-05 DIAGNOSIS — F333 Major depressive disorder, recurrent, severe with psychotic symptoms: Secondary | ICD-10-CM | POA: Diagnosis present

## 2018-04-05 DIAGNOSIS — Z79899 Other long term (current) drug therapy: Secondary | ICD-10-CM

## 2018-04-05 DIAGNOSIS — F17210 Nicotine dependence, cigarettes, uncomplicated: Secondary | ICD-10-CM | POA: Diagnosis present

## 2018-04-05 DIAGNOSIS — Z681 Body mass index (BMI) 19 or less, adult: Secondary | ICD-10-CM

## 2018-04-05 DIAGNOSIS — R45851 Suicidal ideations: Secondary | ICD-10-CM | POA: Diagnosis present

## 2018-04-05 DIAGNOSIS — C259 Malignant neoplasm of pancreas, unspecified: Secondary | ICD-10-CM | POA: Diagnosis present

## 2018-04-05 DIAGNOSIS — M25559 Pain in unspecified hip: Secondary | ICD-10-CM | POA: Diagnosis present

## 2018-04-05 DIAGNOSIS — E039 Hypothyroidism, unspecified: Secondary | ICD-10-CM | POA: Diagnosis present

## 2018-04-05 DIAGNOSIS — T50912A Poisoning by multiple unspecified drugs, medicaments and biological substances, intentional self-harm, initial encounter: Secondary | ICD-10-CM | POA: Diagnosis present

## 2018-04-05 DIAGNOSIS — T50902A Poisoning by unspecified drugs, medicaments and biological substances, intentional self-harm, initial encounter: Secondary | ICD-10-CM | POA: Diagnosis not present

## 2018-04-05 DIAGNOSIS — F17200 Nicotine dependence, unspecified, uncomplicated: Secondary | ICD-10-CM | POA: Diagnosis present

## 2018-04-05 DIAGNOSIS — Z915 Personal history of self-harm: Secondary | ICD-10-CM | POA: Diagnosis not present

## 2018-04-05 LAB — GLUCOSE, CAPILLARY
GLUCOSE-CAPILLARY: 95 mg/dL (ref 70–99)
GLUCOSE-CAPILLARY: 77 mg/dL (ref 70–99)
Glucose-Capillary: 87 mg/dL (ref 70–99)
Glucose-Capillary: 145 mg/dL — ABNORMAL HIGH (ref 70–99)
Glucose-Capillary: 118 mg/dL — ABNORMAL HIGH (ref 70–99)

## 2018-04-05 LAB — MAGNESIUM: Magnesium: 1.8 mg/dL (ref 1.7–2.4)

## 2018-04-05 LAB — VITAMIN B12: VITAMIN B 12: 407 pg/mL (ref 180–914)

## 2018-04-05 MED ORDER — LORAZEPAM 0.5 MG PO TABS: 0.5000 mg | ORAL_TABLET | Freq: Three times a day (TID) | ORAL | Status: DC

## 2018-04-05 MED ORDER — RISPERIDONE 1 MG PO TABS
0.5000 mg | ORAL_TABLET | Freq: Every day | ORAL | Status: DC
  Administered 2018-04-05: 0.5 mg via ORAL
  Filled 2018-04-05: qty 1

## 2018-04-05 MED ORDER — SENNOSIDES 8.8 MG/5ML PO SYRP
5.0000 mL | ORAL_SOLUTION | Freq: Two times a day (BID) | ORAL | Status: DC | PRN
  Filled 2018-04-05: qty 5

## 2018-04-05 MED ORDER — HYDROXYZINE HCL 25 MG PO TABS
12.5000 mg | ORAL_TABLET | Freq: Three times a day (TID) | ORAL | Status: DC | PRN
  Administered 2018-04-06: 12.5 mg via ORAL
  Filled 2018-04-05: qty 1

## 2018-04-05 MED ORDER — CITALOPRAM HYDROBROMIDE 20 MG PO TABS
20.0000 mg | ORAL_TABLET | Freq: Every day | ORAL | Status: DC
  Administered 2018-04-06: 20 mg via ORAL
  Filled 2018-04-05: qty 1

## 2018-04-05 MED ORDER — AMITRIPTYLINE HCL 50 MG PO TABS: 75.0000 mg | ORAL_TABLET | Freq: Every day | ORAL | Status: DC

## 2018-04-05 MED ORDER — GABAPENTIN 600 MG PO TABS
300.0000 mg | ORAL_TABLET | Freq: Three times a day (TID) | ORAL | Status: DC
  Administered 2018-04-05 – 2018-04-07 (×6): 300 mg via ORAL
  Filled 2018-04-05 (×6): qty 1

## 2018-04-05 MED ORDER — ALUM & MAG HYDROXIDE-SIMETH 200-200-20 MG/5ML PO SUSP: 30.0000 mL | ORAL | Status: DC | PRN

## 2018-04-05 MED ORDER — ADULT MULTIVITAMIN W/MINERALS CH: 1.0000 | ORAL_TABLET | Freq: Every day | ORAL | Status: DC

## 2018-04-05 MED ORDER — ADULT MULTIVITAMIN W/MINERALS CH
1.0000 | ORAL_TABLET | Freq: Every day | ORAL | Status: DC
  Administered 2018-04-06 – 2018-04-09 (×4): 1 via ORAL
  Filled 2018-04-05 (×4): qty 1

## 2018-04-05 MED ORDER — TRAZODONE HCL 50 MG PO TABS: 50.0000 mg | ORAL_TABLET | Freq: Every evening | ORAL | Status: DC | PRN

## 2018-04-05 MED ORDER — ENSURE ENLIVE PO LIQD
237.0000 mL | Freq: Three times a day (TID) | ORAL | Status: DC
  Administered 2018-04-05: 237 mL via ORAL

## 2018-04-05 MED ORDER — LEVOTHYROXINE SODIUM 50 MCG PO TABS
125.0000 ug | ORAL_TABLET | Freq: Every day | ORAL | Status: DC
  Administered 2018-04-06 – 2018-04-09 (×4): 125 ug via ORAL
  Filled 2018-04-05 (×5): qty 1

## 2018-04-05 MED ORDER — ACETAMINOPHEN 325 MG PO TABS
650.0000 mg | ORAL_TABLET | Freq: Four times a day (QID) | ORAL | Status: DC | PRN
  Administered 2018-04-06 – 2018-04-09 (×10): 650 mg via ORAL
  Filled 2018-04-05 (×10): qty 2

## 2018-04-05 MED ORDER — CEPHALEXIN 500 MG PO CAPS
500.0000 mg | ORAL_CAPSULE | Freq: Three times a day (TID) | ORAL | Status: DC
  Administered 2018-04-05 – 2018-04-09 (×13): 500 mg via ORAL
  Filled 2018-04-05 (×13): qty 1

## 2018-04-05 MED ORDER — HALOPERIDOL 1 MG PO TABS: 1.0000 mg | ORAL_TABLET | Freq: Three times a day (TID) | ORAL | Status: DC

## 2018-04-05 MED ORDER — LORAZEPAM 0.5 MG PO TABS: 0.5000 mg | ORAL_TABLET | Freq: Four times a day (QID) | ORAL | Status: DC | PRN

## 2018-04-05 MED ORDER — CITALOPRAM HYDROBROMIDE 20 MG PO TABS: 20.0000 mg | ORAL_TABLET | Freq: Every day | ORAL | Status: DC

## 2018-04-05 MED ORDER — RISPERIDONE 0.5 MG PO TABS: 0.5000 mg | ORAL_TABLET | Freq: Every day | ORAL | Status: DC

## 2018-04-05 MED ORDER — ACETAMINOPHEN 325 MG PO TABS: 650.0000 mg | ORAL_TABLET | Freq: Four times a day (QID) | ORAL | Status: DC | PRN

## 2018-04-05 MED ORDER — SERTRALINE HCL 100 MG PO TABS: 100.0000 mg | ORAL_TABLET | Freq: Every day | ORAL | Status: DC

## 2018-04-05 MED ORDER — OXYCODONE-ACETAMINOPHEN 5-325 MG PO TABS
1.0000 | ORAL_TABLET | Freq: Four times a day (QID) | ORAL | Status: DC | PRN
Start: 2018-04-05 — End: 2018-04-06
  Administered 2018-04-05 – 2018-04-06 (×2): 1 via ORAL
  Filled 2018-04-05 (×2): qty 1

## 2018-04-05 MED ORDER — MAGNESIUM HYDROXIDE 400 MG/5ML PO SUSP: 30.0000 mL | Freq: Every day | ORAL | Status: DC | PRN

## 2018-04-05 MED ORDER — ENSURE ENLIVE PO LIQD
237.0000 mL | Freq: Three times a day (TID) | ORAL | Status: DC
  Administered 2018-04-05 – 2018-04-09 (×10): 237 mL via ORAL

## 2018-04-05 NOTE — Progress Notes (Signed)
Received Diana West from the medical unit, alert and oriented x4. She was cooperative with the admission process. She stated her OD was impulsive and she regrets her suicidal attempt. She denied feeling suicidal at the present time, she does endorse feeling anxious. She was oriented to her new environment, ate dinner and talked with her son.

## 2018-04-05 NOTE — [facility] Group Notes (Signed)
[facility] Group Notes:  (Nursing/MHT/Case Management/Adjunct)  Date:  04/05/2018  Time:  10:25 PM  Type of Therapy:  Group Therapy  Participation Level:  Active  Participation Quality:  Appropriate  Affect:  Appropriate  Cognitive:  Appropriate  Insight:  Appropriate  Engagement in Group:  Engaged  Modes of Intervention:  Discussion  Summary of Progress/Problems: MHT informed group of rules and expectations of the unit. Group made introductions and discussed goal for the day. MHT processed with group about barriers to accomplishing goals. MHT encouraged group to discuss problems experienced before coming and developing a plan to address upon discharge. MHT explained each person needed to work on the problem that was the reason admitted. MHT encouraged medication compliance and discussed the intended purpose of taking medications. MHT encouraged patients to follow up with recommended treatment upon discharge. MHT informed group they could get services through [facility] if they did not have insurance. This was Sevin's first day on the unit. Cara stated she was feeling anxious about coming to the unit, but was feeling much better. Rhesa stated she did not have a goal for the day, but would think of one for tomorrow. Barnie Mort 04/05/2018, 10:25 PM

## 2018-04-05 NOTE — Consult Note (Signed)
[facility] Consult   Reason for Consult: Follow-up consult for 62 year old woman who made a suicide attempt by overdose of antidepressants Referring Physician: Bridgett Larsson Patient Identification: Diana West MRN:  [id number] Principal Diagnosis: Suicide attempt by multiple drug overdose [facility]) Diagnosis:   Patient Active Problem List   Diagnosis Date Noted  . Severe episode of recurrent major depressive disorder, with psychotic features ([place]) [F33.3]   . Generalized anxiety disorder [F41.1]   . Suicide attempt by multiple drug overdose ([place]) [T50.902A] 04/01/2018  . Hypothyroidism [E03.9] 04/01/2018  . Acute encephalopathy [G93.40] 03/17/2018  . Recurrent moderate major depressive disorder with anxiety ([place]) [F33.1, F41.9] 03/16/2018    Class: Acute  . Pressure injury of skin [L89.90] 03/16/2018  . Metabolic encephalopathy [L87.56] 03/16/2018  . Malnutrition of moderate degree [E44.0] 03/16/2018  . Acute UTI [N39.0] 02/13/2018  . Pancreatic cyst [K86.2] 11/12/2017  . Protein-calorie malnutrition, severe [E43] 07/23/2017  . Acute cystitis [N30.00] 07/22/2017  . TIA (transient ischemic attack) [G45.9] 07/06/2017  . Hypotension [I95.9] 06/26/2017  . Falls frequently [R29.6] 06/26/2017  . Anemia [D64.9] 06/26/2017  . Depression with anxiety [F41.8] 06/26/2017  . FH: premature coronary heart disease [Z82.49] 06/26/2017  . Lumbar disc disease [M51.9] 06/26/2017  . Cervical disc disease [M50.90] 06/26/2017  . Chronic headaches [R51] 06/26/2017  . History of PSVT (paroxysmal supraventricular tachycardia) [Z86.79] 06/26/2017  . Tinea corporis [B35.4] 06/26/2017  . Postherpetic neuralgia [B02.29] 06/26/2017  . CKD (chronic kidney disease) stage 3, GFR 30-59 ml/min (HCC) [N18.3] 06/26/2017    Total Time spent with patient: 20 minutes  Subjective:     Diana West is a 62 year old divorced Caucasian female with a history of recurrent major depression who was admitted  to the ICU after she overdosed on a combination of Zoloft, amitriptyline and gabapentin and intentional suicide attempt.   Cognition has improved this morning and the patient is much clearer in her thoughts. Speech is not rapid or pressured. She is starting to regret the overdose but says that she has "a lot on top of her". She found out that she may have pancreatic cancer and she also found out that her son was diagnosed with type 1 diabetes. She also is adjusting to the idea of moving to an assisted living facility. The patient does admit to ongoing depressive symptoms but no current active suicidal thoughts. She does at times endorse some intermittent passive suicidal thoughts feeling like maybe she is a burden on her son.She feels that moving to an assisted living facility would take a lot of pressure off of her son. She denies any problems with insomnia and got rest last night. Appetite is fairly good. She is agreeable to inpatient psychiatric hospitalization.  Past psychiatric history The patient denies any prior inpatient psychiatric hospitalizations but has been followed by psychiatrist, Dr. Raul Del in [place] for many years. She was diagnosed with depression but denies any other psychiatric diagnosis. She denies currently seeing a therapist. She has had one prior suicide attempt in the past  Family psychiatric history: Her mother struggle with depression  Social history: The patient is currently divorced and she has 2 sons, one that lives in [place] and one that lives in [place]. She is retired.  Legal history: She says she was arrested many years ago for failure to appear in court   Risk to Self:   Yes Risk to Others:  No Prior Inpatient Therapy:   No Prior Outpatient Therapy:  Yes  Past Medical History:  Past Medical  History:  Diagnosis Date  . Anxiety   . Anxiety   . Arthritis   . Cardiac arrhythmia   . Depression   . Falls frequently   . Hypothyroidism   .  Palpitation   . Shingles    she states "they are inside and not contagious"  . TIA (transient ischemic attack)     Past Surgical History:  Procedure Laterality Date  . ABDOMINAL HYSTERECTOMY    . CESAREAN SECTION    . CHOLECYSTECTOMY     Family History:  Family History  Problem Relation Age of Onset  . CAD Mother   . CAD Father     Social History:  Social History   Substance and Sexual Activity  Alcohol Use No     Social History   Substance and Sexual Activity  Drug Use No    Social History   Socioeconomic History  . Marital status: Divorced    Spouse name: Not on file  . Number of children: Not on file  . Years of education: Not on file  . Highest education level: Not on file  Occupational History  . Not on file  Social Needs  . Financial resource strain: Not on file  . Food insecurity:    Worry: Not on file    Inability: Not on file  . Transportation needs:    Medical: Not on file    Non-medical: Not on file  Tobacco Use  . Smoking status: Current Some Day Smoker    Packs/day: 0.25    Types: Cigarettes  . Smokeless tobacco: Never Used  Substance and Sexual Activity  . Alcohol use: No  . Drug use: No  . Sexual activity: Not on file  Lifestyle  . Physical activity:    Days per week: Not on file    Minutes per session: Not on file  . Stress: Not on file  Relationships  . Social connections:    Talks on phone: Not on file    Gets together: Not on file    Attends religious service: Not on file    Active member of club or organization: Not on file    Attends meetings of clubs or organizations: Not on file    Relationship status: Not on file  Other Topics Concern  . Not on file  Social History Narrative   Lives at home by herself. Has a walker to ambulate   Additional Social History:    Allergies:   Allergies  Allergen Reactions  . Codeine Itching and Rash  . Toradol [Ketorolac Tromethamine] Rash    Labs:  Results for orders placed or  performed during the hospital encounter of 04/01/18 (from the past 48 hour(s))  Basic metabolic panel     Status: Abnormal   Collection Time: 04/03/18 10:56 AM  Result Value Ref Range   Sodium 140 135 - 145 mmol/L   Potassium 4.2 3.5 - 5.1 mmol/L   Chloride 116 (H) 98 - 111 mmol/L    Comment: Please note change in reference range.   CO2 19 (L) 22 - 32 mmol/L   Glucose, Bld 80 70 - 99 mg/dL    Comment: Please note change in reference range.   BUN 11 8 - 23 mg/dL    Comment: Please note change in reference range.   Creatinine, Ser 0.70 0.44 - 1.00 mg/dL   Calcium 8.7 (L) 8.9 - 10.3 mg/dL   GFR calc non Af Amer >60 >60 mL/min   GFR calc Af Amer >  60 >60 mL/min    Comment: (NOTE) The eGFR has been calculated using the CKD EPI equation. This calculation has not been validated in all clinical situations. eGFR's persistently <60 mL/min signify possible Chronic Kidney Disease.    Anion gap 5 5 - 15    Comment: Performed at [facility], [address] [place], [place] [address]  Glucose, capillary     Status: Abnormal   Collection Time: 04/03/18 11:35 AM  Result Value Ref Range   Glucose-Capillary 65 (L) 70 - 99 mg/dL  Glucose, capillary     Status: Abnormal   Collection Time: 04/03/18  4:56 PM  Result Value Ref Range   Glucose-Capillary 63 (L) 70 - 99 mg/dL   Comment 1 Notify RN   Glucose, capillary     Status: Abnormal   Collection Time: 04/03/18  5:44 PM  Result Value Ref Range   Glucose-Capillary 180 (H) 70 - 99 mg/dL  Glucose, capillary     Status: None   Collection Time: 04/03/18  8:04 PM  Result Value Ref Range   Glucose-Capillary 91 70 - 99 mg/dL  Glucose, capillary     Status: None   Collection Time: 04/03/18 11:42 PM  Result Value Ref Range   Glucose-Capillary 88 70 - 99 mg/dL  Glucose, capillary     Status: None   Collection Time: 04/04/18  4:33 AM  Result Value Ref Range   Glucose-Capillary 93 70 - 99 mg/dL  Basic metabolic panel     Status: Abnormal    Collection Time: 04/04/18  5:42 AM  Result Value Ref Range   Sodium 137 135 - 145 mmol/L   Potassium 3.8 3.5 - 5.1 mmol/L   Chloride 108 98 - 111 mmol/L    Comment: Please note change in reference range.   CO2 21 (L) 22 - 32 mmol/L   Glucose, Bld 105 (H) 70 - 99 mg/dL    Comment: Please note change in reference range.   BUN 9 8 - 23 mg/dL    Comment: Please note change in reference range.   Creatinine, Ser 0.62 0.44 - 1.00 mg/dL   Calcium 8.7 (L) 8.9 - 10.3 mg/dL   GFR calc non Af Amer >60 >60 mL/min   GFR calc Af Amer >60 >60 mL/min    Comment: (NOTE) The eGFR has been calculated using the CKD EPI equation. This calculation has not been validated in all clinical situations. eGFR's persistently <60 mL/min signify possible Chronic Kidney Disease.    Anion gap 8 5 - 15    Comment: Performed at [facility], [address] [place], [place] [address]  CBC     Status: Abnormal   Collection Time: 04/04/18  5:42 AM  Result Value Ref Range   WBC 7.4 3.6 - 11.0 K/uL   RBC 3.38 (L) 3.80 - 5.20 MIL/uL   Hemoglobin 9.2 (L) 12.0 - 16.0 g/dL   HCT 28.2 (L) 35.0 - 47.0 %   MCV 83.3 80.0 - 100.0 fL   MCH 27.2 26.0 - 34.0 pg   MCHC 32.6 32.0 - 36.0 g/dL   RDW 17.4 (H) 11.5 - 14.5 %   Platelets 260 150 - 440 K/uL    Comment: Performed at [facility], [address] [place], [place] [address]  Magnesium     Status: Abnormal   Collection Time: 04/04/18  5:42 AM  Result Value Ref Range   Magnesium 1.4 (L) 1.7 - 2.4 mg/dL    Comment: Performed at  [facility], [address] [place], [place] [address]  Phosphorus     Status: None   Collection Time: 04/04/18  5:42 AM  Result Value Ref Range   Phosphorus 2.7 2.5 - 4.6 mg/dL    Comment: Performed at [facility], [address] [place], [place] [address]  Glucose, capillary     Status: None   Collection Time: 04/04/18  7:37 AM  Result Value Ref Range   Glucose-Capillary 96 70 - 99 mg/dL  Glucose,  capillary     Status: None   Collection Time: 04/04/18 12:31 PM  Result Value Ref Range   Glucose-Capillary 89 70 - 99 mg/dL  Glucose, capillary     Status: None   Collection Time: 04/04/18  4:07 PM  Result Value Ref Range   Glucose-Capillary 82 70 - 99 mg/dL  Glucose, capillary     Status: Abnormal   Collection Time: 04/04/18  8:01 PM  Result Value Ref Range   Glucose-Capillary 112 (H) 70 - 99 mg/dL  Glucose, capillary     Status: Abnormal   Collection Time: 04/04/18 11:11 PM  Result Value Ref Range   Glucose-Capillary 107 (H) 70 - 99 mg/dL  Vitamin B12     Status: None   Collection Time: 04/05/18  5:44 AM  Result Value Ref Range   Vitamin B-12 407 180 - 914 pg/mL    Comment: (NOTE) This assay is not validated for testing neonatal or myeloproliferative syndrome specimens for Vitamin B12 levels. Performed at [facility], [place] [place], [place] [address]   Magnesium     Status: None   Collection Time: 04/05/18  5:49 AM  Result Value Ref Range   Magnesium 1.8 1.7 - 2.4 mg/dL    Comment: Performed at [facility], [address] [place], [place] [address]    Current Facility-Administered Medications  Medication Dose Route Frequency Provider Last Rate Last Dose  . 0.9 %  sodium chloride infusion  250 mL Intravenous PRN Lance Coon, MD      . Chlorhexidine Gluconate Cloth 2 % PADS 6 each  6 each Topical Q0600 Conforti, John, DO   6 each at 04/05/18 0610  . citalopram (CELEXA) tablet 20 mg  20 mg Oral Daily Chauncey Mann, MD   20 mg at 04/05/18 0852  . dextrose 5 %-0.45 % sodium chloride infusion   Intravenous Continuous Conforti, John, DO 50 mL/hr at 04/04/18 1600    . enoxaparin (LOVENOX) injection 40 mg  40 mg Subcutaneous Q24H Lance Coon, MD   40 mg at 04/04/18 2125  . LORazepam (ATIVAN) injection 1-2 mg  1-2 mg Intravenous Q4H PRN Awilda Bill, NP   2 mg at 04/04/18 4193  . LORazepam (ATIVAN) tablet 0.5 mg  0.5 mg Oral BID Chauncey Mann,  MD   0.5 mg at 04/05/18 0852  . [START ON 04/08/2018] LORazepam (ATIVAN) tablet 0.5 mg  0.5 mg Oral Daily Chauncey Mann, MD      . MEDLINE mouth rinse  15 mL Mouth Rinse BID Tukov-Yual, Magdalene S, NP   15 mL at 04/05/18 0852  . mupirocin ointment (BACTROBAN) 2 % 1 application  1 application Nasal BID Conforti, John, DO   1 application at 79/02/40 [phone]  . oxyCODONE-acetaminophen (PERCOCET/ROXICET) 5-325 MG per tablet 1 tablet  1 tablet Oral Q4H PRN Saundra Shelling, MD   1 tablet at 04/05/18 0740  . risperiDONE (RISPERDAL) tablet 0.5 mg  0.5 mg Oral  QHS Chauncey Mann, MD   0.5 mg at 04/04/18 2125  . sennosides (SENOKOT) 8.8 MG/5ML syrup 5 mL  5 mL Per Tube BID PRN Awilda Bill, NP        Musculoskeletal: Strength & Muscle Tone: decreased and atrophy Gait & Station: unable to stand Patient leans: N/A  Psychiatric Specialty Exam: Physical Exam  Psychiatric: Her speech is normal and behavior is normal. Her mood appears anxious. Cognition and memory are normal. She expresses impulsivity. She exhibits a depressed mood.  Cognition improved    ROS : See hospitalist note  Blood pressure 122/78, pulse 87, temperature 98.4 F (36.9 C), temperature source Oral, resp. rate 17, height 5' 6"  (1.676 m), weight 48.8 kg (107 lb 9.4 oz), SpO2 98 %.Body mass index is 17.36 kg/m.  General Appearance: Casual  Eye Contact:  None  Speech:  Clear and Coherent and Normal Rate  Volume:  Normal  Mood:  Depressed  Affect:  Mildly anxious  Thought Process:  tangential  Orientation:  Full (Time, Place, and Person)  Thought Content:  Logical  Suicidal Thoughts:  Passive SI - intermittently  Homicidal Thoughts:  No  Memory:  Immediate;   Fair Recent;   Fair Remote;   Fair  Judgement:  Good  Insight:  Good  Psychomotor Activity:  Negative  Concentration:  Concentration: Fair and Attention Span: Fair  Recall:  Negative  Fund of Knowledge:  Negative  Language:  Negative  Akathisia:  Negative  Handed:   Right  AIMS (if indicated):     Assets:  Communication Skills Desire for Improvement Housing Social Support  ADL's:  Intact  Cognition:  WNL  Sleep:        Treatment Plan Summary:  DAIGNOSIS Major depressive disorder, recurrent with psychosis Generalized Anxiety Disorder S/P Overdose  Ms. Hyder is a 62 year old divorced Caucasian female with history of recurrent depression and some mild psychosis her son who was admitted to the medical floor status post overdose in a suicide attempt. The patient overdosed on antidepressant medication secondary to conflict with other women at her condo complex. She also just recently found out that she may have pancreatic cancer. She denies any current active suicidal thoughts but does not regret the suicide attempt.   Major depressive disorder, recurrent with psychosis: She was started on  Celexa 20 mg by mouth daily no longer use Zoloft. She was also starte Risperdal 0.5 mg by mouth nightly for psychosis.  Per her son, she was having paranoid thoughts at home. She will no longer use amitriptyline now she will be on Celexa and Risperdal. The patient will not be restarted on Neurontin and is unclear whether or not it was being used for pain or anxiety. We'll try to get collateral information from Dr. Michaelle Birks, the patient's outpatient psychiatrist on Monday. Please keep with 1:1 sitter for now  We'll continue supportive psychotherapy with follow-up visits. The patient will greatly benefit from individual therapy outside the hospital.  The patient will remain undemorning and Monday through Friday andr IVC at this time and will pursue inpatient psychiatric hospitalization for medication management, safety and stabilization once medically cleared.    Disposition: Recommend psychiatric Inpatient admission when medically cleared.  Chauncey Mann, MD 04/05/2018 9:09 AM

## 2018-04-05 NOTE — Progress Notes (Signed)
Diana West to be D/C'd to behavioral health per MD order.  Discussed prescriptions and follow up appointments with the patient. Prescriptions given to patient, medication list explained in detail. Pt verbalized understanding.    Vitals:   04/05/18 1139 04/05/18 1719  BP: (!) 94/52 132/83  Pulse: (!) 102 89  Resp: 18   Temp: 98.3 F (36.8 C)   SpO2: 99%     Skin clean, dry and intact without evidence of skin break down, no evidence of skin tears noted. IV catheter discontinued intact. Site without signs and symptoms of complications. Dressing and pressure applied. Pt denies pain at this time. No complaints noted.  An After Visit Summary was printed and given to the patient. Patient escorted via security and taken down to behavioral.  Sharalyn Ink

## 2018-04-05 NOTE — Progress Notes (Signed)
Patient ID: Diana West, female   DOB: 04/06/1956, 62 y.o.   MRN: [id number] Per State regulations 482.30 this chart was reviewed for medical necessity with respect to the patient's admission/duration of stay.    Next review date: 04/09/18  Debarah Crape, BSN, RN-BC  Case Manager

## 2018-04-05 NOTE — BH Assessment (Signed)
Patient is to be admitted to [facility] by Dr. Gates Rigg.  Attending Physician will be Dr. Bary Leriche.   Patient has been assigned to room 325, by [place] Staff is aware of the admission  Janett Billow, Patient's Nurse

## 2018-04-05 NOTE — Progress Notes (Signed)
Nutrition Follow-up  DOCUMENTATION CODES:   Severe malnutrition in context of social or environmental circumstances, Underweight  INTERVENTION:  Will downgrade diet to dysphagia 3 (mechanical soft) with thin liquids. Provide extra gravy on cut up meats.  Provide Ensure Enlive po TID, each supplement provides 350 kcal and 20 grams of protein. Patient prefers vanilla. Provided Ensure coupons for when patient able to discharge home.  Provide daily MVI.  Encouraged patient to eat 3 regular, well-balanced meals daily at home after discharge. Discussed cooking larger amounts when she does cook and freezing them to eat later. Also discussed buying frozen meals that she can thaw and cook as needed.  NUTRITION DIAGNOSIS:   Severe Malnutrition related to social / environmental circumstances(anxiety, depression) as evidenced by severe fat depletion, severe muscle depletion.  Ongoing - addressing with downgrading diet and Ensure Enlive.  GOAL:   Patient will meet greater than or equal to 90% of their needs  Progressing.  MONITOR:   Diet advancement, Labs, Weight trends, I & O's  REASON FOR ASSESSMENT:   Consult, Other (Comment)(Low [place]) Assessment of nutrition requirement/status  ASSESSMENT:   62 year old female with PMHx of anxiety, depression, shingles, hx TIA, frequent falls, arthritis, hypothyroidism who was admitted after intentional drug overdose of amitriptyline, gabapentin, and sertraline requiring mechanical intubation for airway protection 6/26-6/27.   -Patient transferred to [facility] on 6/29. Started on soft diet on 6/29. -Plan is for patient to transfer downstairs to inpatient behavioral unit today.  Met with patient at bedside. She has Air cabin crew currently. She reports she ate pretty well at breakfast this morning and finished most of her tray. Her appetite was poor PTA for a few weeks. She reports it is related to stressors at home, living alone, and that she is stuck  inside all day since she is not driving anymore. She cooks occasionally, but only makes a few servings at a time, so the cooked meals are not enough to last very long. She loves vanilla Ensure and drinks them regularly at home. She endorses she has been losing weight over the past few years. Noted patient is edentulous. She accidentally flushed her bottom dentures a while ago. She is unsure if she has her top dentures here. She is amenable to having dysphagia 3 (mechanical soft) diet here. She reports she will do fine with burgers to come regular.  Medications reviewed.  Labs reviewed: CBG 82-112. On 6/29 Potassium and Phosphorus WNL. Magnesium WNL today after supplementation.  Discussed with RN and MD.  Diet Order:   Diet Order           Diet - low sodium heart healthy        DIET SOFT Room service appropriate? Yes; Fluid consistency: Thin  Diet effective now          EDUCATION NEEDS:   Not appropriate for education at this time  Skin:  Skin Assessment: Reviewed RN Assessment(ecchymosis to bilateral arms and legs)  Last BM:  04/04/2018 - large type 6  Height:   Ht Readings from Last 1 Encounters:  04/02/18 _0  (1.676 m)    Weight:   Wt Readings from Last 1 Encounters:  04/05/18 107 lb 9.4 oz (48.8 kg)    Ideal Body Weight:  59.1 kg  BMI:  Body mass index is 17.36 kg/m.  Estimated Nutritional Needs:   Kcal:  1525-1780 (30-35 kcal/kg)  Protein:  75-85 grams (1.5-1.7 grams/kg)  Fluid:  1.5-1.8 L/day (30-35 mL/kg)  Willey Blade, MS, RD,  LDN Office: [phone] Pager: [phone] After Hours/Weekend Pager: [phone]

## 2018-04-05 NOTE — Discharge Summary (Addendum)
[facility] [place] NAME: Diana West    MR#:  [id number]  DATE OF BIRTH:  [birth year]  DATE OF ADMISSION:  04/01/2018   ADMITTING PHYSICIAN: Lance Coon, MD  DATE OF DISCHARGE:04/05/2018  PRIMARY CARE PHYSICIAN: Patient, No Pcp Per   ADMISSION DIAGNOSIS:  Suicide attempt ([place]) [T14.91XA] Unresponsiveness [R41.89] Intentional drug overdose, initial encounter ([place]) [T50.902A] DISCHARGE DIAGNOSIS:  Principal Problem:   Suicide attempt by multiple drug overdose ([place]) Active Problems:   Depression with anxiety   Hypothyroidism   Severe episode of recurrent major depressive disorder, with psychotic features ([place])   Generalized anxiety disorder  SECONDARY DIAGNOSIS:   Past Medical History:  Diagnosis Date  . Anxiety   . Anxiety   . Arthritis   . Cardiac arrhythmia   . Depression   . Falls frequently   . Hypothyroidism   . Palpitation   . Shingles    she states "they are inside and not contagious"  . TIA (transient ischemic attack)    HOSPITAL COURSE:   Suicide attempt by multiple drug overdose The patient was intubated due to respiratory compromise from overdose.  She was extubated but was agitated and put on Precedex.  She is on IVC. She is weaned off sedation. No agitation. Per Dr. Nicolasa Ducking, started Celexa and Risperdal. The patient will remain under IVC at this time and will pursue inpatient psychiatric hospitalization for medication management, safety and stabilization once medically cleared.  Depression with anxiety -holding all home meds at this time, psych consult as above Hypothyroidism -continue home dose thyroid replacement.  Severe malnutrition.  Dietitian consult.  Hypomagnesemia.  Magnesium supplement and improved.  Tobacco abuse. Smoking cessation was counseled for 3 minutes. She wants to quit.  Medically stable. DISCHARGE CONDITIONS:  Stable, discharge to psych unit today. CONSULTS OBTAINED:    Treatment Team:  Clapacs, Madie Reno, MD DRUG ALLERGIES:   Allergies  Allergen Reactions  . Codeine Itching and Rash  . Toradol [Ketorolac Tromethamine] Rash   DISCHARGE MEDICATIONS:   Allergies as of 04/05/2018      Reactions   Codeine Itching, Rash   Toradol [ketorolac Tromethamine] Rash      Medication List    STOP taking these medications   ALPRAZolam 0.5 MG tablet Commonly known as:  XANAX   amitriptyline 150 MG tablet Commonly known as:  ELAVIL   gabapentin 600 MG tablet Commonly known as:  NEURONTIN   sertraline 100 MG tablet Commonly known as:  ZOLOFT     TAKE these medications   citalopram 20 MG tablet Commonly known as:  CELEXA Take 1 tablet (20 mg total) by mouth daily. Start taking on:  04/06/2018   levothyroxine 125 MCG tablet Commonly known as:  SYNTHROID, LEVOTHROID Take 125 mcg by mouth daily before breakfast.   risperiDONE 0.5 MG tablet Commonly known as:  RISPERDAL Take 1 tablet (0.5 mg total) by mouth at bedtime.        DISCHARGE INSTRUCTIONS:  See AVS.  If you experience worsening of your admission symptoms, develop shortness of breath, life threatening emergency, suicidal or homicidal thoughts you must seek medical attention immediately by calling 911 or calling your MD immediately  if symptoms less severe.  You Must read complete instructions/literature along with all the possible adverse reactions/side effects for all the Medicines you take and that have been prescribed to you. Take any new Medicines after you have completely understood and accpet all the possible adverse reactions/side effects.  Please note  You were cared for by a hospitalist during your hospital stay. If you have any questions about your discharge medications or the care you received while you were in the hospital after you are discharged, you can call the unit and asked to speak with the hospitalist on call if the hospitalist that took care of you is not available.  Once you are discharged, your primary care physician will handle any further medical issues. Please note that NO REFILLS for any discharge medications will be authorized once you are discharged, as it is imperative that you return to your primary care physician (or establish a relationship with a primary care physician if you do not have one) for your aftercare needs so that they can reassess your need for medications and monitor your lab values.    On the day of Discharge:  VITAL SIGNS:  Blood pressure 122/78, pulse 87, temperature 98.4 F (36.9 C), temperature source Oral, resp. rate 17, height 5\' 6"  (1.676 m), weight 107 lb 9.4 oz (48.8 kg), SpO2 98 %. PHYSICAL EXAMINATION:  GENERAL:  62 y.o.-year-old patient lying in the bed with no acute distress. Malnutrition. EYES: Pupils equal, round, reactive to light and accommodation. No scleral icterus. Extraocular muscles intact.  HEENT: Head atraumatic, normocephalic. Oropharynx and nasopharynx clear.  NECK:  Supple, no jugular venous distention. No thyroid enlargement, no tenderness.  LUNGS: Normal breath sounds bilaterally, no wheezing, rales,rhonchi or crepitation. No use of accessory muscles of respiration.  CARDIOVASCULAR: S1, S2 normal. No murmurs, rubs, or gallops.  ABDOMEN: Soft, non-tender, non-distended. Bowel sounds present. No organomegaly or mass.  EXTREMITIES: No pedal edema, cyanosis, or clubbing.  NEUROLOGIC: Cranial nerves II through XII are intact. Muscle strength 5/5 in all extremities. Sensation intact. Gait not checked.  PSYCHIATRIC: The patient is alert and oriented x 3.  SKIN: No obvious rash, lesion, or ulcer.  DATA REVIEW:   CBC Recent Labs  Lab 04/04/18 0542  WBC 7.4  HGB 9.2*  HCT 28.2*  PLT 260    Chemistries  Recent Labs  Lab 04/01/18 2147  04/04/18 0542 04/05/18 0549  NA 137   < > 137  --   K 3.3*   < > 3.8  --   CL 113*   < > 108  --   CO2 19*   < > 21*  --   GLUCOSE 104*   < > 105*  --   BUN 17    < > 9  --   CREATININE 0.93   < > 0.62  --   CALCIUM 8.1*   < > 8.7*  --   MG  --    < > 1.4* 1.8  AST 15  --   --   --   ALT 8  --   --   --   ALKPHOS 56  --   --   --   BILITOT 0.3  --   --   --    < > = values in this interval not displayed.     Microbiology Results  Results for orders placed or performed during the hospital encounter of 04/01/18  Blood culture (routine x 2)     Status: None (Preliminary result)   Collection Time: 04/01/18  9:56 PM  Result Value Ref Range Status   Specimen Description BLOOD RIGHT [facility]  Final   Special Requests   Final    BOTTLES DRAWN AEROBIC AND ANAEROBIC Blood Culture adequate volume   Culture  Final    NO GROWTH 4 DAYS Performed at [facility], [address] [place], [place] [address]    Report Status PENDING  Incomplete  Blood culture (routine x 2)     Status: None (Preliminary result)   Collection Time: 04/01/18  9:56 PM  Result Value Ref Range Status   Specimen Description BLOOD LEFT WRIST  Final   Special Requests   Final    BOTTLES DRAWN AEROBIC AND ANAEROBIC Blood Culture results may not be optimal due to an excessive volume of blood received in culture bottles   Culture   Final    NO GROWTH 4 DAYS Performed at [facility], [address], [place], [place] [address]    Report Status PENDING  Incomplete  MRSA PCR Screening     Status: Abnormal   Collection Time: 04/02/18  3:11 AM  Result Value Ref Range Status   MRSA by PCR POSITIVE (A) NEGATIVE Final    Comment:        The GeneXpert MRSA Assay (FDA approved for NASAL specimens only), is one component of a comprehensive MRSA colonization surveillance program. It is not intended to diagnose MRSA infection nor to guide or monitor treatment for MRSA infections. c/ DALE HOPKINS @0440  04/02/18 [facility] Performed at [facility], [address], [place], [place] [address]     RADIOLOGY:  No results found.   Management plans discussed with the  patient, family and they are in agreement.  CODE STATUS: Full Code   TOTAL TIME TAKING CARE OF THIS PATIENT: 33   minutes.    Demetrios Loll M.D on 04/05/2018 at 9:36 AM  Between 7am to 6pm - Pager - [phone]  After 6pm go to [email or web address] - Proofreader  [facility]  Office  [phone]  CC: Primary care physician; Patient, No Pcp Per   Note: This dictation was prepared with Dragon dictation along with smaller phrase technology. Any transcriptional errors that result from this process are unintentional.

## 2018-04-06 DIAGNOSIS — F333 Major depressive disorder, recurrent, severe with psychotic symptoms: Principal | ICD-10-CM

## 2018-04-06 LAB — BLOOD CULTURE ID PANEL (REFLEXED)
Acinetobacter baumannii: NOT DETECTED
CANDIDA PARAPSILOSIS: NOT DETECTED
Candida albicans: NOT DETECTED
Candida glabrata: NOT DETECTED
Candida krusei: NOT DETECTED
Candida tropicalis: NOT DETECTED
ENTEROBACTER CLOACAE COMPLEX: NOT DETECTED
ENTEROBACTERIACEAE SPECIES: NOT DETECTED
ESCHERICHIA COLI: NOT DETECTED
Enterococcus species: NOT DETECTED
Haemophilus influenzae: NOT DETECTED
KLEBSIELLA PNEUMONIAE: NOT DETECTED
Klebsiella oxytoca: NOT DETECTED
LISTERIA MONOCYTOGENES: NOT DETECTED
NEISSERIA MENINGITIDIS: NOT DETECTED
Proteus species: NOT DETECTED
Pseudomonas aeruginosa: NOT DETECTED
SERRATIA MARCESCENS: NOT DETECTED
STAPHYLOCOCCUS AUREUS BCID: NOT DETECTED
STREPTOCOCCUS AGALACTIAE: NOT DETECTED
STREPTOCOCCUS PYOGENES: NOT DETECTED
STREPTOCOCCUS SPECIES: NOT DETECTED
Staphylococcus species: NOT DETECTED
Streptococcus pneumoniae: NOT DETECTED

## 2018-04-06 LAB — CULTURE, BLOOD (ROUTINE X 2): Culture: NO GROWTH

## 2018-04-06 LAB — LIPID PANEL
Cholesterol: 186 mg/dL (ref 0–200)
HDL: 45 mg/dL (ref >40)
LDL Cholesterol: 103 mg/dL — ABNORMAL HIGH (ref 0–99)
Total CHOL/HDL Ratio: 4.1 RATIO
Triglycerides: 190 mg/dL — ABNORMAL HIGH (ref <150)
VLDL: 38 mg/dL (ref 0–40)

## 2018-04-06 LAB — HEMOGLOBIN A1C
HEMOGLOBIN A1C: 5.2 % (ref 4.8–5.6)
Mean Plasma Glucose: 102.54 mg/dL

## 2018-04-06 MED ORDER — VENLAFAXINE HCL ER 75 MG PO CP24
150.0000 mg | ORAL_CAPSULE | Freq: Every day | ORAL | Status: DC
  Administered 2018-04-07 – 2018-04-09 (×3): 150 mg via ORAL
  Filled 2018-04-06 (×3): qty 2

## 2018-04-06 MED ORDER — NICOTINE 21 MG/24HR TD PT24
21.0000 mg | MEDICATED_PATCH | Freq: Every day | TRANSDERMAL | Status: DC
  Administered 2018-04-06 – 2018-04-09 (×4): 21 mg via TRANSDERMAL
  Filled 2018-04-06 (×4): qty 1

## 2018-04-06 MED ORDER — CHLORDIAZEPOXIDE HCL 10 MG PO CAPS
10.0000 mg | ORAL_CAPSULE | Freq: Three times a day (TID) | ORAL | Status: AC
  Administered 2018-04-06 – 2018-04-09 (×9): 10 mg via ORAL
  Filled 2018-04-06 (×9): qty 1

## 2018-04-06 MED ORDER — RISPERIDONE 1 MG PO TABS
2.0000 mg | ORAL_TABLET | Freq: Every day | ORAL | Status: DC
  Administered 2018-04-06: 2 mg via ORAL
  Filled 2018-04-06: qty 2

## 2018-04-06 NOTE — Progress Notes (Addendum)
PHARMACY - PHYSICIAN COMMUNICATION  Results for orders placed or performed during the hospital encounter of 04/01/18  Blood Culture ID Panel (Reflexed) (Collected: 04/01/2018  9:56 PM)  Result Value Ref Range   Enterococcus species NOT DETECTED NOT DETECTED   Listeria monocytogenes NOT DETECTED NOT DETECTED   Staphylococcus species NOT DETECTED NOT DETECTED   Staphylococcus aureus NOT DETECTED NOT DETECTED   Streptococcus species NOT DETECTED NOT DETECTED   Streptococcus agalactiae NOT DETECTED NOT DETECTED   Streptococcus pneumoniae NOT DETECTED NOT DETECTED   Streptococcus pyogenes NOT DETECTED NOT DETECTED   Acinetobacter baumannii NOT DETECTED NOT DETECTED   Enterobacteriaceae species NOT DETECTED NOT DETECTED   Enterobacter cloacae complex NOT DETECTED NOT DETECTED   Escherichia coli NOT DETECTED NOT DETECTED   Klebsiella oxytoca NOT DETECTED NOT DETECTED   Klebsiella pneumoniae NOT DETECTED NOT DETECTED   Proteus species NOT DETECTED NOT DETECTED   Serratia marcescens NOT DETECTED NOT DETECTED   Haemophilus influenzae NOT DETECTED NOT DETECTED   Neisseria meningitidis NOT DETECTED NOT DETECTED   Pseudomonas aeruginosa NOT DETECTED NOT DETECTED   Candida albicans NOT DETECTED NOT DETECTED   Candida glabrata NOT DETECTED NOT DETECTED   Candida krusei NOT DETECTED NOT DETECTED   Candida parapsilosis NOT DETECTED NOT DETECTED   Candida tropicalis NOT DETECTED NOT DETECTED  As seen above, we currently do not have a speciated bacteria. We have 1/2 GPR growing out in the blood from 6/26. This patient is currently cephalexin and is afebrile with all previous WBCs wnl.  Name of physician (or Provider) Contacted: Dr Bary Leriche  Changes to prescribed antibiotics required: It is uncertain whether this is a contaminant at this point. Dr Bary Leriche would like to contact internal medicine about whether to treat as an infection. I will follow up tomorrow for speciation data and along with the  hospitalist.  Addendum: Today the culture was isolated as P. [place]. I spoke with both Dr Bary Leriche and Dr Graylon Good from [facility]. Dr Graylon Good feels certain this is a contaminant based on culture results and clinical picture.  Dallie Piles, PharmD 04/06/2018  1:33 PM

## 2018-04-06 NOTE — BHH Counselor (Signed)
Adult Comprehensive Assessment  Patient ID: Diana West, female   DOB: 1955-11-12, 62 y.o.   MRN: [id number]  Information Source: Information source: Patient  Current Stressors:  Patient states their primary concerns and needs for treatment are:: Depression, "trying to take my own life" Patient states their goals for this hospitilization and ongoing recovery are:: to get placement in Assisted living facility where she can have her cat and feel better. Family Relationships: feels isolated from her two sons and came here to help now she feels she cannot be involved as she was hoping with her grandchild. Housing / Lack of housing: Apartment has told her they will not renew her lease at the end of July due to so many calls to Police and other disturbances she has caused. Physical health (include injuries & life threatening diseases): cancer diagnosis Substance abuse: sometimes runs out of Xanax too early per her son Bereavement / Loss: lost an aunt last month.  Living/Environment/Situation:  Living Arrangements: Alone Living conditions (as described by patient or guardian): good but she does say she sometimes feels unsafe in her appartment Who else lives in the home?: N/A How long has patient lived in current situation?: for a year or 2 What is atmosphere in current home: Comfortable, Temporary  Family History:  Marital status: Divorced Are you sexually active?: No Does patient have children?: Yes How many children?: 2 How is patient's relationship with their children?: adult sons, one in [place] and one in [place]  Childhood History:  Does patient have siblings?: No Did patient suffer any verbal/emotional/physical/sexual abuse as a child?: No Did patient suffer from severe childhood neglect?: No Has patient ever been sexually abused/assaulted/raped as an adolescent or adult?: No Was the patient ever a victim of a crime or a disaster?: No Witnessed domestic  violence?: No Has patient been effected by domestic violence as an adult?: No  Education:  Currently a Ship broker?: No Learning disability?: No  Employment/Work Situation:   Employment situation: On disability Patient's job has been impacted by current illness: No Did You Receive Any Psychiatric Treatment/Services While in the [employer]?: No Are There Guns or Other Weapons in [place]?: No  Financial Resources:   Financial resources: Teacher, early years/pre Does patient have a Programmer, applications or guardian?: No  Alcohol/Substance Abuse:   What has been your use of drugs/alcohol within the last 12 months?: She says none, her son does express some concern about her taking her more than prescribed of her xanax and that she may  "run out of them too early on occasion" If attempted suicide, did drugs/alcohol play a role in this?: No Alcohol/Substance Abuse Treatment Hx: Denies past history Has alcohol/substance abuse ever caused legal problems?: No  Social Support System:   Heritage manager System: Poor Describe Community Support System: she is originally from [place] and left a lot of friends there to be with her son and grandchild in [place] area  Leisure/Recreation:      Strengths/Needs:   What is the patient's perception of their strengths?: Having always taken care of herself and 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 Patient states these barriers may affect their return to the community: worries about Son not being attentive enough but also says he is "pushy, tells me what to do"  Discharge Plan:   Currently receiving community mental health services: Yes (From Whom)(CBC in [place]) Does patient have access to transportation?: Yes(Son transports to appointments) Does patient have financial barriers related to  discharge medications?: No Will patient be returning to same living situation after discharge?: Yes(Most likely will return home unless ALF bed is available  right away.)  Summary/Recommendations:   Summary and Recommendations (to be completed by the evaluator): Pt is 62 yo woman who was admitted to [place] after an overdose in attempt to complete suicide. She verbalizes that she now regrets this decision but shares that she did this in the context of worsening depression and frustration with her current situation. She shares feeling isolated and as though she is not getting support she needs from her son.  While on the unit she will have the opportunity to participate in groups and therapeutic milieu. She will have medications managed and assistance with appropriate discharge planning. Reccomendations include attending all follow up appointments and taking medications as prescribed at discharge.  [place], LCSW. 04/06/2018

## 2018-04-06 NOTE — BHH Suicide Risk Assessment (Signed)
[name] INPATIENT:  Family/Significant Other Suicide Prevention Education  Suicide Prevention Education:  Education Completed; [name], Son,  (name of family member/significant other) has been identified by the patient as the family member/significant other with whom the patient will be residing, and identified as the person(s) who will aid the patient in the event of a mental health crisis (suicidal ideations/suicide attempt).  With written consent from the patient, the family member/significant other has been provided the following suicide prevention education, prior to the and/or following the discharge of the patient.  The suicide prevention education provided includes the following:  Suicide risk factors  Suicide prevention and interventions  National Suicide Hotline telephone number  [name] assessment telephone number  [facility] Emergency Assistance [place] and/or Residential Mobile Crisis Unit telephone number  Request made of family/significant other to:  Remove weapons (e.g., guns, rifles, knives), all items previously/currently identified as safety concern.    Remove drugs/medications (over-the-counter, prescriptions, illicit drugs), all items previously/currently identified as a safety concern.  The family member/significant other verbalizes understanding of the suicide prevention education information provided.  The family member/significant other agrees to remove the items of safety concern listed above.  Carloyn Jaeger Madline Oesterling, LCSW 04/06/2018, 4:57 PM

## 2018-04-06 NOTE — H&P (Signed)
Psychiatric Admission Assessment Adult  West Identification: Diana West MRN:  [id number] Date of Evaluation:  04/06/2018 Chief Complaint:  Severe episode of recurrent major depressive disorder, with psychotic features  Principal Diagnosis: Severe episode of recurrent major depressive disorder, with psychotic features ([place]) Diagnosis:   West Active Problem List   Diagnosis Date Noted  . Severe episode of recurrent major depressive disorder, with psychotic features ([place]) [F33.3]     Priority: High  . Recurrent moderate major depressive disorder with anxiety ([place]) [F33.1, F41.9] 03/16/2018    Priority: High    Class: Acute  . Tobacco use disorder [F17.200] 04/05/2018  . Generalized anxiety disorder [F41.1]   . Suicide attempt by multiple drug overdose ([place]) [T50.902A] 04/01/2018  . Hypothyroidism [E03.9] 04/01/2018  . Acute encephalopathy [G93.40] 03/17/2018  . Pressure injury of skin [L89.90] 03/16/2018  . Metabolic encephalopathy [S97.02] 03/16/2018  . Malnutrition of moderate degree [E44.0] 03/16/2018  . Acute UTI [N39.0] 02/13/2018  . Pancreatic cyst [K86.2] 11/12/2017  . Protein-calorie malnutrition, severe [E43] 07/23/2017  . Acute cystitis [N30.00] 07/22/2017  . TIA (transient ischemic attack) [G45.9] 07/06/2017  . Hypotension [I95.9] 06/26/2017  . Falls frequently [R29.6] 06/26/2017  . Anemia [D64.9] 06/26/2017  . Depression with anxiety [F41.8] 06/26/2017  . FH: premature coronary heart disease [Z82.49] 06/26/2017  . Lumbar disc disease [M51.9] 06/26/2017  . Cervical disc disease [M50.90] 06/26/2017  . Chronic headaches [R51] 06/26/2017  . History of PSVT (paroxysmal supraventricular tachycardia) [Z86.79] 06/26/2017  . Tinea corporis [B35.4] 06/26/2017  . Postherpetic neuralgia [B02.29] 06/26/2017  . CKD (chronic kidney disease) stage 3, GFR 30-59 ml/min (HCC) [N18.3] 06/26/2017   History of Present Illness:   Identifying data. Diana West is a  62 year old female with a history of depression and anxiety.  Chief complaint. "I need my nerve pill."  History of present illness. Information was obtained from Diana West and Diana chart. Diana West was transferred from ICU where she was hospitalized after overdose on multiple medications requiring intubation. She denies feeling suicidal now but admits that she felt sorry for herself believing that only her cat loves her. She is rather disappointed in her family. She relocated to [place] to be closer to her son and take care of her grandson. She however developed multiple health problems including falls, fainting episodes that preclude her from driving, memory problems and confusion. She has been hospitalized several times for UTI. Her son has been very worried about her as she has been abuding/misusing Xanax that is prescribed by her primary psychiatrist in [place]. Diana West denies any symptoms of depression or psychosis. She endorses high anxiety. She did not get any benzos since 6/28 and does not appear anxious but she feels anxious inside. There are no alcohol or other drugs involved.   Past psychiatric history. One previous hospitalization while in crisis but no suicide attempts. Has a psychiatrist and did well on Zoloft, Elavil and Xanax 1 mg TID. She is unwilling to stop Xanax, in spite of confusion and falls. She will not allow her son to manage her Xanax and frequently "runs out".   Family psychiatric history. None.  Social history. She lives by herself but took steps to relocate to assisted living facility. She was diagnosed with pancreatic cancer and will start chemo soon.    Total Time spent with West: 1 hour  Is Diana West at risk to self? Yes.    Has Diana West been a risk to self in Diana past 6 months? No.  Has  Diana West been a risk to self within Diana distant past? Yes.    Is Diana West a risk to others? No.  Has Diana West been a risk to others in Diana past 6 months? No.   Has Diana West been a risk to others within Diana distant past? No.   Prior Inpatient Therapy:   Prior Outpatient Therapy:    Alcohol Screening: 1. How often do you have a drink containing alcohol?: Never 2. How many drinks containing alcohol do you have on a typical day when you are drinking?: 1 or 2 3. How often do you have six or more drinks on one occasion?: Never AUDIT-C Score: 0 4. How often during Diana last year have you found that you were not able to stop drinking once you had started?: Never 5. How often during Diana last year have you failed to do what was normally expected from you becasue of drinking?: Never 6. How often during Diana last year have you needed a first drink in Diana morning to get yourself going after a heavy drinking session?: Never 7. How often during Diana last year have you had a feeling of guilt of remorse after drinking?: Never 8. How often during Diana last year have you been unable to remember what happened Diana night before because you had been drinking?: Never 9. Have you or someone else been injured as a result of your drinking?: No 10. Has a relative or friend or a doctor or another health worker been concerned about your drinking or suggested you cut down?: No Alcohol Use Disorder Identification Test Final Score (AUDIT): 0 Intervention/Follow-up: AUDIT Score <7 follow-up not indicated Substance Abuse History in Diana last 12 months:  Yes.   Consequences of Substance Abuse: Negative Previous Psychotropic Medications: Yes  Psychological Evaluations: No  Past Medical History:  Past Medical History:  Diagnosis Date  . Anxiety   . Anxiety   . Arthritis   . Cardiac arrhythmia   . Depression   . Falls frequently   . Hypothyroidism   . Palpitation   . Shingles    she states "they are inside and not contagious"  . TIA (transient ischemic attack)     Past Surgical History:  Procedure Laterality Date  . ABDOMINAL HYSTERECTOMY    . CESAREAN SECTION    .  CHOLECYSTECTOMY     Family History:  Family History  Problem Relation Age of Onset  . CAD Mother   . CAD Father    Tobacco Screening: Have you used any form of tobacco in Diana last 30 days? (Cigarettes, Smokeless Tobacco, Cigars, and/or Pipes): Yes Tobacco use, Select all that apply: 5 or more cigarettes per day Are you interested in Tobacco Cessation Medications?: No, West refused Counseled West on smoking cessation including recognizing danger situations, developing coping skills and basic information about quitting provided: Yes Social History:  Social History   Substance and Sexual Activity  Alcohol Use No     Social History   Substance and Sexual Activity  Drug Use No    Additional Social History:                           Allergies:   Allergies  Allergen Reactions  . Codeine Itching and Rash  . Toradol [Ketorolac Tromethamine] Rash   Lab Results:  Results for orders placed or performed during Diana hospital encounter of 04/05/18 (from Diana past 48 hour(s))  Hemoglobin A1c  Status: None   Collection Time: 04/06/18  6:37 AM  Result Value Ref Range   Hgb A1c MFr Bld 5.2 4.8 - 5.6 %    Comment: (NOTE) Pre diabetes:          5.7%-6.4% Diabetes:              >6.4% Glycemic control for   <7.0% adults with diabetes    Mean Plasma Glucose 102.54 mg/dL    Comment: Performed at [facility] [address] [place], [place] [address]  Lipid panel     Status: Abnormal   Collection Time: 04/06/18  6:37 AM  Result Value Ref Range   Cholesterol 186 0 - 200 mg/dL   Triglycerides 190 (H) <150 mg/dL   HDL 45 >40 mg/dL   Total CHOL/HDL Ratio 4.1 RATIO   VLDL 38 0 - 40 mg/dL   LDL Cholesterol 103 (H) 0 - 99 mg/dL    Comment:        Total Cholesterol/HDL:CHD Risk Coronary Heart Disease Risk Table                     Men   Women  1/2 Average Risk   3.4   3.3  Average Risk       5.0   4.4  2 X Average Risk   9.6   7.1  3 X Average Risk  23.4   11.0         Use Diana calculated West Ratio above and Diana CHD Risk Table to determine Diana West's CHD Risk.        ATP III CLASSIFICATION (LDL):  <100     mg/dL   Optimal  100-129  mg/dL   Near or Above                    Optimal  130-159  mg/dL   Borderline  160-189  mg/dL   High  >190     mg/dL   Very High Performed at [facility], [address] [place], [place] [address]     Blood Alcohol level:  Lab Results  Component Value Date   [facility] <10 04/01/2018   ETH <10 09/81/1914    Metabolic Disorder Labs:  Lab Results  Component Value Date   HGBA1C 5.2 04/06/2018   MPG 102.54 04/06/2018   No results found for: PROLACTIN Lab Results  Component Value Date   CHOL 186 04/06/2018   TRIG 190 (H) 04/06/2018   HDL 45 04/06/2018   CHOLHDL 4.1 04/06/2018   VLDL 38 04/06/2018   LDLCALC 103 (H) 04/06/2018   LDLCALC 84 06/26/2017    Current Medications: Current Facility-Administered Medications  Medication Dose Route Frequency Provider Last Rate Last Dose  . acetaminophen (TYLENOL) tablet 650 mg  650 mg Oral Q6H PRN He, Jun, MD      . alum & mag hydroxide-simeth (MAALOX/MYLANTA) 200-200-20 MG/5ML suspension 30 mL  30 mL Oral Q4H PRN Chauncey Mann, MD      . cephALEXin (KEFLEX) capsule 500 mg  500 mg Oral TID He, Jun, MD   500 mg at 04/06/18 1157  . chlordiazePOXIDE (LIBRIUM) capsule 10 mg  10 mg Oral TID Ruberta Holck B, MD      . feeding supplement (ENSURE ENLIVE) (ENSURE ENLIVE) liquid 237 mL  237 mL Oral TID BM Chauncey Mann, MD   237 mL at 04/05/18 2107  . gabapentin (NEURONTIN) tablet 300 mg  300 mg  Oral TID He, Jun, MD   300 mg at 04/06/18 1157  . hydrOXYzine (ATARAX/VISTARIL) tablet 12.5 mg  12.5 mg Oral TID PRN He, Jun, MD      . levothyroxine (SYNTHROID, LEVOTHROID) tablet 125 mcg  125 mcg Oral QAC breakfast He, Jun, MD   125 mcg at 04/06/18 0814  . magnesium hydroxide (MILK OF MAGNESIA) suspension 30 mL  30 mL Oral Daily PRN Chauncey Mann, MD      .  multivitamin with minerals tablet 1 tablet  1 tablet Oral Daily Chauncey Mann, MD   1 tablet at 04/06/18 4818  . nicotine (NICODERM CQ - dosed in mg/24 hours) patch 21 mg  21 mg Transdermal Daily Adah Stoneberg B, MD   21 mg at 04/06/18 1002  . risperiDONE (RISPERDAL) tablet 2 mg  2 mg Oral QHS Yaeli Hartung B, MD      . sennosides (SENOKOT) 8.8 MG/5ML syrup 5 mL  5 mL Per Tube BID PRN Chauncey Mann, MD      . Derrill Memo ON 04/07/2018] venlafaxine XR (EFFEXOR-XR) 24 hr capsule 150 mg  150 mg Oral Q breakfast Jeffery Bachmeier B, MD       PTA Medications: Medications Prior to Admission  Medication Sig Dispense Refill Last Dose  . citalopram (CELEXA) 20 MG tablet Take 1 tablet (20 mg total) by mouth daily.   04/05/2018 at Unknown time  . levothyroxine (SYNTHROID, LEVOTHROID) 125 MCG tablet Take 125 mcg by mouth daily before breakfast.   04/05/2018 at Unknown time  . risperiDONE (RISPERDAL) 0.5 MG tablet Take 1 tablet (0.5 mg total) by mouth at bedtime.       Musculoskeletal: Strength & Muscle Tone: within normal limits Gait & Station: normal West leans: N/A  Psychiatric Specialty Exam: I reviewed physical exam performed on medical floor and agree with Diana findings. Physical Exam  Nursing note and vitals reviewed. Psychiatric: She has a normal mood and affect. Her speech is normal and behavior is normal. Thought content normal. Cognition and memory are normal. She expresses impulsivity.    Review of Systems  Musculoskeletal: Positive for falls.  Psychiatric/Behavioral: Positive for depression and substance abuse.  All other systems reviewed and are negative.   Blood pressure (!) 153/62, pulse 98, temperature 98 F (36.7 C), temperature source Oral, resp. rate 18, height 5' 6.93" (1.7 m), weight 48.1 kg (106 lb), SpO2 96 %.Body mass index is 16.64 kg/m.  See SRA.                                                  Sleep:  Number of Hours: 5.45     Treatment Plan Summary: Daily contact with West to assess and evaluate symptoms and progress in treatment and Medication management   Diana West is a 62 year old female with a history of depression transferred from ICU where she was hospitalized after overdose on multiple medications.  #Suicidal ideation -West able to contract for safety in Diana hospital  #Mood improved -Zoloft was discontinue and Celexa started but Diana West prefers Zoloft -start Effexor 150 mg daily -increase Risperdal to 2 mg nightly  #Anxiety -continue Neurontin 300 mg TID  #Benzodiazepine misuse -will hold Xanax and start Librium taper  #Hypothyroidism -Synthroid 125 ug daily  #Gram positive blood culture -afebrile -growth on day 5, possibly contamination -continue Keflex while awaiting  final report  #Smoking cessation -nicotine patch is available  #Labs -lipid panle and A1C -EKG  #Disposition -TBE -family insists on placement -follow up with a new provider   Observation Level/Precautions:  15 minute checks  Laboratory:  CBC Chemistry Profile UDS UA  Psychotherapy:    Medications:    Consultations:    Discharge Concerns:    Estimated LOS:  Other:     Physician Treatment Plan for Primary Diagnosis: Severe episode of recurrent major depressive disorder, with psychotic features ([place]) Long Term Goal(s): Improvement in symptoms so as ready for discharge  Short Term Goals: Ability to identify changes in lifestyle to reduce recurrence of condition will improve, Ability to verbalize feelings will improve, Ability to disclose and discuss suicidal ideas, Ability to demonstrate self-control will improve, Ability to identify and develop effective coping behaviors will improve, Ability to maintain clinical measurements within normal limits will improve, Compliance with prescribed medications will improve and Ability to identify triggers associated with substance abuse/mental health issues  will improve  Physician Treatment Plan for Secondary Diagnosis: Principal Problem:   Severe episode of recurrent major depressive disorder, with psychotic features ([place]) Active Problems:   Protein-calorie malnutrition, severe   Pancreatic cyst   Suicide attempt by multiple drug overdose ([place])   Hypothyroidism   Tobacco use disorder  Long Term Goal(s): Improvement in symptoms so as ready for discharge  Short Term Goals: Ability to identify changes in lifestyle to reduce recurrence of condition will improve, Ability to demonstrate self-control will improve and Ability to identify triggers associated with substance abuse/mental health issues will improve  I certify that inpatient services furnished can reasonably be expected to improve Diana West's condition.    Orson Slick, MD 7/1/20191:55 PM

## 2018-04-06 NOTE — Plan of Care (Signed)
Patient is adjusting well in the unit, takes her medicines  and cooperating with medical staff and ADLs  no distress , patient is monitored every 15 minute and placed near the nurses station and no falls noted. Problem: Education: Goal: Ability to make informed decisions regarding treatment will improve Outcome: Progressing   Problem: Coping: Goal: Coping ability will improve Outcome: Progressing   Problem: Health Behavior/Discharge Planning: Goal: Identification of resources available to assist in meeting health care needs will improve Outcome: Progressing   Problem: Self-Concept: Goal: Ability to disclose and discuss suicidal ideas will improve Outcome: Progressing Goal: Will verbalize positive feelings about self Outcome: Progressing   Problem: Education: Goal: Knowledge of General Education information will improve Outcome: Progressing   Problem: Health Behavior/Discharge Planning: Goal: Ability to manage health-related needs will improve Outcome: Progressing   Problem: Clinical Measurements: Goal: Ability to maintain clinical measurements within normal limits will improve Outcome: Progressing Goal: Will remain free from infection Outcome: Progressing Goal: Diagnostic test results will improve Outcome: Progressing Goal: Respiratory complications will improve Outcome: Progressing Goal: Cardiovascular complication will be avoided Outcome: Progressing   Problem: Activity: Goal: Risk for activity intolerance will decrease Outcome: Progressing   Problem: Nutrition: Goal: Adequate nutrition will be maintained Outcome: Progressing   Problem: Coping: Goal: Level of anxiety will decrease Outcome: Progressing   Problem: Elimination: Goal: Will not experience complications related to bowel motility Outcome: Progressing Goal: Will not experience complications related to urinary retention Outcome: Progressing   Problem: Pain Managment: Goal: General experience of  comfort will improve Outcome: Progressing   Problem: Safety: Goal: Ability to remain free from injury will improve Outcome: Progressing   Problem: Skin Integrity: Goal: Risk for impaired skin integrity will decrease Outcome: Progressing

## 2018-04-06 NOTE — Progress Notes (Signed)
Recreation Therapy Notes  Date: 04/06/2018  Time: 9:30 am   Location: Craft room  Behavioral response: N/A   Intervention Topic: Coping skills  Discussion/Intervention: Patient did not attend group.   Clinical Observations/Feedback:  Patient did not attend group.   Refael Fulop LRT/CTRS         Kania Regnier 04/06/2018 11:25 AM

## 2018-04-06 NOTE — Progress Notes (Signed)
[facility] MD Progress Note  04/07/2018 4:58 PM Diana West  MRN:  [id number]  Subjective:   Diana West did not sleep at all last night. She is no longer on Elavil after overdose. She complains of anxiety "inside". She does not appear too anxious rather "lost". She is not as intrusive today. She is in agreement with placement but if bad in not available, she could go back to [place]. She does not own property there but could stay on a farm with [place] late husband's relatives. She complains of hip pain. She thinks it started in ICU. She does not remember falling.  Principal Problem: Severe episode of recurrent major depressive disorder, with psychotic features ([place]) Diagnosis:   Patient Active Problem List   Diagnosis Date Noted  . Severe episode of recurrent major depressive disorder, with psychotic features ([place]) [F33.3]     Priority: High  . Recurrent moderate major depressive disorder with anxiety ([place]) [F33.1, F41.9] 03/16/2018    Priority: High    Class: Acute  . Tobacco use disorder [F17.200] 04/05/2018  . Generalized anxiety disorder [F41.1]   . Suicide attempt by multiple drug overdose ([place]) [T50.902A] 04/01/2018  . Hypothyroidism [E03.9] 04/01/2018  . Acute encephalopathy [G93.40] 03/17/2018  . Pressure injury of skin [L89.90] 03/16/2018  . Metabolic encephalopathy [G64.40] 03/16/2018  . Malnutrition of moderate degree [E44.0] 03/16/2018  . Acute UTI [N39.0] 02/13/2018  . Pancreatic cyst [K86.2] 11/12/2017  . Protein-calorie malnutrition, severe [E43] 07/23/2017  . Acute cystitis [N30.00] 07/22/2017  . TIA (transient ischemic attack) [G45.9] 07/06/2017  . Hypotension [I95.9] 06/26/2017  . Falls frequently [R29.6] 06/26/2017  . Anemia [D64.9] 06/26/2017  . Depression with anxiety [F41.8] 06/26/2017  . FH: premature coronary heart disease [Z82.49] 06/26/2017  . Lumbar disc disease [M51.9] 06/26/2017  . Cervical disc disease [M50.90] 06/26/2017  . Chronic headaches [R51]  06/26/2017  . History of PSVT (paroxysmal supraventricular tachycardia) [Z86.79] 06/26/2017  . Tinea corporis [B35.4] 06/26/2017  . Postherpetic neuralgia [B02.29] 06/26/2017  . CKD (chronic kidney disease) stage 3, GFR 30-59 ml/min (HCC) [N18.3] 06/26/2017   Total Time spent with patient: 20 minutes  Past Psychiatric History: depression, anxiety  Past Medical History:  Past Medical History:  Diagnosis Date  . Anxiety   . Anxiety   . Arthritis   . Cardiac arrhythmia   . Depression   . Falls frequently   . Hypothyroidism   . Palpitation   . Shingles    she states "they are inside and not contagious"  . TIA (transient ischemic attack)     Past Surgical History:  Procedure Laterality Date  . ABDOMINAL HYSTERECTOMY    . CESAREAN SECTION    . CHOLECYSTECTOMY     Family History:  Family History  Problem Relation Age of Onset  . CAD Mother   . CAD Father    Family Psychiatric  History: none Social History:  Social History   Substance and Sexual Activity  Alcohol Use No     Social History   Substance and Sexual Activity  Drug Use No    Social History   Socioeconomic History  . Marital status: Divorced    Spouse name: Not on file  . Number of children: Not on file  . Years of education: Not on file  . Highest education level: Not on file  Occupational History  . Not on file  Social Needs  . Financial resource strain: Not on file  . Food insecurity:    Worry: Not on file  Inability: Not on file  . Transportation needs:    Medical: Not on file    Non-medical: Not on file  Tobacco Use  . Smoking status: Current Some Day Smoker    Packs/day: 0.25    Years: 10.00    Pack years: 2.50    Types: Cigarettes  . Smokeless tobacco: Never Used  Substance and Sexual Activity  . Alcohol use: No  . Drug use: No  . Sexual activity: Not on file  Lifestyle  . Physical activity:    Days per week: Not on file    Minutes per session: Not on file  . Stress: Not on  file  Relationships  . Social connections:    Talks on phone: Not on file    Gets together: Not on file    Attends religious service: Not on file    Active member of club or organization: Not on file    Attends meetings of clubs or organizations: Not on file    Relationship status: Not on file  Other Topics Concern  . Not on file  Social History Narrative   Lives at home by herself. Has a walker to ambulate   Additional Social History:                         Sleep: Fair  Appetite:  Poor  Current Medications: Current Facility-Administered Medications  Medication Dose Route Frequency Provider Last Rate Last Dose  . acetaminophen (TYLENOL) tablet 650 mg  650 mg Oral Q6H PRN He, Jun, MD   650 mg at 04/07/18 1552  . alum & mag hydroxide-simeth (MAALOX/MYLANTA) 200-200-20 MG/5ML suspension 30 mL  30 mL Oral Q4H PRN Chauncey Mann, MD      . cephALEXin [facility]) capsule 500 mg  500 mg Oral TID He, Jun, MD   500 mg at 04/07/18 1216  . chlordiazePOXIDE (LIBRIUM) capsule 10 mg  10 mg Oral TID Oscar Forman B, MD   10 mg at 04/07/18 1216  . feeding supplement (ENSURE ENLIVE) (ENSURE ENLIVE) liquid 237 mL  237 mL Oral TID BM Chauncey Mann, MD   237 mL at 04/07/18 1021  . gabapentin (NEURONTIN) tablet 600 mg  600 mg Oral TID Jaielle Dlouhy B, MD      . levothyroxine (SYNTHROID, LEVOTHROID) tablet 125 mcg  125 mcg Oral QAC breakfast He, Jun, MD   125 mcg at 04/07/18 0608  . lidocaine (LIDODERM) 5 % 1 patch  1 patch Transdermal Q24H Dawnelle Warman B, MD   1 patch at 04/07/18 1216  . magnesium hydroxide (MILK OF MAGNESIA) suspension 30 mL  30 mL Oral Daily PRN Chauncey Mann, MD      . multivitamin with minerals tablet 1 tablet  1 tablet Oral Daily Chauncey Mann, MD   1 tablet at 04/07/18 0829  . nicotine (NICODERM CQ - dosed in mg/24 hours) patch 21 mg  21 mg Transdermal Daily Tejasvi Brissett B, MD   21 mg at 04/07/18 0829  . QUEtiapine (SEROQUEL) tablet 100 mg   100 mg Oral QHS Keveon Amsler B, MD      . venlafaxine XR (EFFEXOR-XR) 24 hr capsule 150 mg  150 mg Oral Q breakfast Arron Tetrault B, MD   150 mg at 04/07/18 1962    Lab Results:  Results for orders placed or performed during the hospital encounter of 04/05/18 (from the past 48 hour(s))  Hemoglobin A1c     Status: None  Collection Time: 04/06/18  6:37 AM  Result Value Ref Range   Hgb A1c MFr Bld 5.2 4.8 - 5.6 %    Comment: (NOTE) Pre diabetes:          5.7%-6.4% Diabetes:              >6.4% Glycemic control for   <7.0% adults with diabetes    Mean Plasma Glucose 102.54 mg/dL    Comment: Performed at [facility] [address] [place], [place] [address]  Lipid panel     Status: Abnormal   Collection Time: 04/06/18  6:37 AM  Result Value Ref Range   Cholesterol 186 0 - 200 mg/dL   Triglycerides 190 (H) <150 mg/dL   HDL 45 >40 mg/dL   Total CHOL/HDL Ratio 4.1 RATIO   VLDL 38 0 - 40 mg/dL   LDL Cholesterol 103 (H) 0 - 99 mg/dL    Comment:        Total Cholesterol/HDL:CHD Risk Coronary Heart Disease Risk Table                     Men   Women  1/2 Average Risk   3.4   3.3  Average Risk       5.0   4.4  2 X Average Risk   9.6   7.1  3 X Average Risk  23.4   11.0        Use the calculated Patient Ratio above and the CHD Risk Table to determine the patient's CHD Risk.        ATP III CLASSIFICATION (LDL):  <100     mg/dL   Optimal  100-129  mg/dL   Near or Above                    Optimal  130-159  mg/dL   Borderline  160-189  mg/dL   High  >190     mg/dL   Very High Performed at [facility], [address] [place], [place] [address]     Blood Alcohol level:  Lab Results  Component Value Date   [facility] <10 04/01/2018   ETH <10 52/77/8242    Metabolic Disorder Labs: Lab Results  Component Value Date   HGBA1C 5.2 04/06/2018   MPG 102.54 04/06/2018   No results found for: PROLACTIN Lab Results  Component Value Date   CHOL 186  04/06/2018   TRIG 190 (H) 04/06/2018   HDL 45 04/06/2018   CHOLHDL 4.1 04/06/2018   VLDL 38 04/06/2018   LDLCALC 103 (H) 04/06/2018   LDLCALC 84 06/26/2017    Physical Findings: AIMS:  , ,  ,  ,    CIWA:    COWS:     Musculoskeletal: Strength & Muscle Tone: within normal limits Gait & Station: normal Patient leans: N/A  Psychiatric Specialty Exam: Physical Exam  Nursing note and vitals reviewed. Psychiatric: [place] speech is normal. [place] mood appears anxious. [place] affect is angry. She is hyperactive. Thought content is paranoid and delusional. Cognition and memory are impaired. She expresses impulsivity.    Review of Systems  Musculoskeletal: Positive for falls.  Neurological: Positive for dizziness and weakness.  Psychiatric/Behavioral: Positive for depression and suicidal ideas.  All other systems reviewed and are negative.   Blood pressure 123/77, pulse (!) 49, temperature 98 F (36.7 C), temperature source Oral, resp. rate 16, height 5' 6.93" (1.7 m), weight 48.1 kg (106 lb), SpO2 97 %.Body mass  index is 16.64 kg/m.  General Appearance: Casual  Eye Contact:  Fair  Speech:  Clear and Coherent  Volume:  Increased  Mood:  Dysphoric and Irritable  Affect:  Inappropriate and Labile  Thought Process:  Goal Directed and Descriptions of Associations: Tangential  Orientation:  Full (Time, Place, and Person)  Thought Content:  Illogical, Delusions, Obsessions and Paranoid Ideation  Suicidal Thoughts:  No  Homicidal Thoughts:  No  Memory:  Immediate;   Fair Recent;   Fair Remote;   Fair  Judgement:  Poor  Insight:  Lacking  Psychomotor Activity:  Increased  Concentration:  Concentration: Fair and Attention Span: Fair  Recall:  [employer] of Knowledge:  Fair  Language:  Fair  Akathisia:  No  Handed:  Right  AIMS (if indicated):     Assets:  Communication Skills Desire for Improvement Financial Resources/Insurance Housing Resilience Social Support  ADL's:  Intact   Cognition:  WNL  Sleep:  Number of Hours: 0.3     Treatment Plan Summary: Daily contact with patient to assess and evaluate symptoms and progress in treatment and Medication management   [place] is a 62 year old female with a history of depression transferred from ICU where she was hospitalized after overdose on multiple medications improving slowly. The son reports paranoia and bizarre behaviors at [place] apartment that led to [place] loosing the place.  #Suicidal ideation, resolved -patient able to contract for safety in the hospital  #Mood/psychosis, improved -continue Effexor 150 mg daily  -discontinue Risperdal -start Seroquel 100 mg nightly for paranoia and sleep  #Anxiety -increase Neurontin to 600 mg TID  #Benzodiazepine misuse -hold Xanax  -continue Librium taper  #Hypothyroidism -Synthroid 125 ug daily  -Hip pain Lidoderm patch  #Gram positive blood culture -afebrile -growth on day 5, possibly contamination -continue Keflex while awaiting final report  #Smoking cessation -nicotine patch is available  #Labs -lipid panel and A1C -EKG  #Disposition -TBE -[place] lease is up at the end of July and will not be extended -family insists on placement -follow up with a new provider    Orson Slick, MD 04/07/2018, 4:58 PM

## 2018-04-06 NOTE — BHH Suicide Risk Assessment (Signed)
[facility] Admission Suicide Risk Assessment   Nursing information obtained from:  Patient Demographic factors:  Divorced or widowed, Living alone, Caucasian, Low socioeconomic status Current Mental Status:  Suicidal ideation indicated by patient, Self-harm behaviors, Belief that plan would result in death Loss Factors:  Financial problems / change in socioeconomic status, Decline in physical health Historical Factors:  Prior suicide attempts, Impulsivity Risk Reduction Factors:  Positive social support, Positive coping skills or problem solving skills  Total Time spent with patient: 1 hour Principal Problem: Severe episode of recurrent major depressive disorder, with psychotic features ([place]) Diagnosis:   Patient Active Problem List   Diagnosis Date Noted  . Severe episode of recurrent major depressive disorder, with psychotic features ([place]) [F33.3]     Priority: High  . Recurrent moderate major depressive disorder with anxiety ([place]) [F33.1, F41.9] 03/16/2018    Priority: High    Class: Acute  . Tobacco use disorder [F17.200] 04/05/2018  . Generalized anxiety disorder [F41.1]   . Suicide attempt by multiple drug overdose ([place]) [T50.902A] 04/01/2018  . Hypothyroidism [E03.9] 04/01/2018  . Acute encephalopathy [G93.40] 03/17/2018  . Pressure injury of skin [L89.90] 03/16/2018  . Metabolic encephalopathy [H41.74] 03/16/2018  . Malnutrition of moderate degree [E44.0] 03/16/2018  . Acute UTI [N39.0] 02/13/2018  . Pancreatic cyst [K86.2] 11/12/2017  . Protein-calorie malnutrition, severe [E43] 07/23/2017  . Acute cystitis [N30.00] 07/22/2017  . TIA (transient ischemic attack) [G45.9] 07/06/2017  . Hypotension [I95.9] 06/26/2017  . Falls frequently [R29.6] 06/26/2017  . Anemia [D64.9] 06/26/2017  . Depression with anxiety [F41.8] 06/26/2017  . FH: premature coronary heart disease [Z82.49] 06/26/2017  . Lumbar disc disease [M51.9] 06/26/2017  . Cervical disc disease [M50.90] 06/26/2017  .  Chronic headaches [R51] 06/26/2017  . History of PSVT (paroxysmal supraventricular tachycardia) [Z86.79] 06/26/2017  . Tinea corporis [B35.4] 06/26/2017  . Postherpetic neuralgia [B02.29] 06/26/2017  . CKD (chronic kidney disease) stage 3, GFR 30-59 ml/min (HCC) [N18.3] 06/26/2017   Subjective Data: overdose  Continued Clinical Symptoms:  Alcohol Use Disorder Identification Test Final Score (AUDIT): 0 The "Alcohol Use Disorders Identification Test", Guidelines for Use in Primary Care, Second Edition.  World Pharmacologist [facility]). Score between 0-7:  no or low risk or alcohol related problems. Score between 8-15:  moderate risk of alcohol related problems. Score between 16-19:  high risk of alcohol related problems. Score 20 or above:  warrants further diagnostic evaluation for alcohol dependence and treatment.   CLINICAL FACTORS:   Depression:   Impulsivity Currently Psychotic   Musculoskeletal: Strength & Muscle Tone: within normal limits Gait & Station: normal Patient leans: N/A  Psychiatric Specialty Exam: Physical Exam  ROS  Blood pressure (!) 153/62, pulse 98, temperature 98 F (36.7 C), temperature source Oral, resp. rate 18, height 5' 6.93" (1.7 m), weight 48.1 kg (106 lb), SpO2 96 %.Body mass index is 16.64 kg/m.  General Appearance: Casual  Eye Contact:  Good  Speech:  Clear and Coherent  Volume:  Normal  Mood:  Euthymic  Affect:  Appropriate  Thought Process:  Goal Directed and Descriptions of Associations: Intact  Orientation:  Full (Time, Place, and Person)  Thought Content:  WDL  Suicidal Thoughts:  No  Homicidal Thoughts:  No  Memory:  Immediate;   Fair Recent;   Fair Remote;   Fair  Judgement:  Impaired  Insight:  Shallow  Psychomotor Activity:  Normal  Concentration:  Concentration: Fair and Attention Span: Fair  Recall:  [employer] of Knowledge:  Fair  Language:  Fair  Akathisia:  No  Handed:  Right  AIMS (if indicated):     Assets:   Communication Skills Desire for Improvement Financial Resources/Insurance Housing Resilience Social Support  ADL's:  Intact  Cognition:  WNL  Sleep:  Number of Hours: 5.45      COGNITIVE FEATURES THAT CONTRIBUTE TO RISK:  None    SUICIDE RISK:   Moderate:  Frequent suicidal ideation with limited intensity, and duration, some specificity in terms of plans, no associated intent, good self-control, limited dysphoria/symptomatology, some risk factors present, and identifiable protective factors, including available and accessible social support.  PLAN OF CARE: hospital admission, medication management, substance abuse counseling, discharge planning.  Ms. Dobbin is a 62 year old female with a history of depression transferred from ICU where she was hospitalized after overdose on multiple medications.  #Suicidal ideation -patient able to contract for safety in the hospital  #Mood improved -Zoloft was discontinue and Celexa started but the patient prefers Zoloft -start Effexor 150 mg daily -increase Risperdal to 2 mg nightly  #Anxiety -continue Neurontin 300 mg TID -will hold Xanax and start Librium taper  #Hypothyroidism -Synthroid 125 ug daily  #Gram positive blood culture -afebrile -growth on day 5, possibly contamination -continue Keflex while awaiting final report  #Smoking cessation -nicotine patch  #Labs -lipid panle and A1C -EKG  #Disposition -TBE -family insists on placement -follow up with a new provider  I certify that inpatient services furnished can reasonably be expected to improve the patient's condition.   Orson Slick, MD 04/06/2018, 1:42 PM

## 2018-04-06 NOTE — Plan of Care (Signed)
Denies SI/HI/AVH.  Denies any depression, endorses anxiety.  Fixated on obtaining xanax. Requires frequent direction.  Presents frequently to nurses station with various requests.  Support offered. Safety maintained on the unit.

## 2018-04-07 MED ORDER — LIDOCAINE 5 % EX PTCH
1.0000 | MEDICATED_PATCH | CUTANEOUS | Status: DC
  Administered 2018-04-07 – 2018-04-09 (×3): 1 via TRANSDERMAL
  Filled 2018-04-07 (×4): qty 1

## 2018-04-07 MED ORDER — QUETIAPINE FUMARATE 100 MG PO TABS
100.0000 mg | ORAL_TABLET | Freq: Every day | ORAL | Status: DC
  Administered 2018-04-07 – 2018-04-08 (×2): 100 mg via ORAL
  Filled 2018-04-07 (×2): qty 1

## 2018-04-07 MED ORDER — DICLOFENAC SODIUM 1 % TD GEL: 4.0000 g | Freq: Four times a day (QID) | TRANSDERMAL | Status: DC

## 2018-04-07 MED ORDER — GABAPENTIN 600 MG PO TABS
600.0000 mg | ORAL_TABLET | Freq: Three times a day (TID) | ORAL | Status: DC
  Administered 2018-04-07 – 2018-04-09 (×7): 600 mg via ORAL
  Filled 2018-04-07 (×7): qty 1

## 2018-04-07 NOTE — BHH Group Notes (Signed)
[facility] Group Notes:  (Nursing/MHT/Case Management/Adjunct)  Date:  04/07/2018  Time:  9:27 PM  Type of Therapy:  Group Therapy  Participation Level:  Active  Participation Quality:  Appropriate  Affect:  Appropriate  Cognitive:  Appropriate  Insight:  Appropriate  Engagement in Group:  Engaged  Modes of Intervention:  Discussion  Summary of Progress/Problems:  [place] 04/07/2018, 9:27 PM

## 2018-04-07 NOTE — Plan of Care (Addendum)
Miserable, pitiful, restless, very anxious, animated with crying spells, sad, depressing, seeking Xanax and Percocet, "the doctor cannot just take away my Xanax, I have been taking Xanax for over 20 years; I can't just stop taking it. I have rheumatoid arthritis, I don't have Percocet for pain?Marland Kitchen..."   Patient slept for Estimated Hours of 30 Minutes Only; Precautionary checks every 15 minutes for safety maintained, room free of safety hazards, patient sustains no injury or falls during this shift.  Problem: Clinical Measurements: Goal: Will remain free from infection Outcome: Progressing   Problem: Nutrition: Goal: Adequate nutrition will be maintained Outcome: Progressing   Problem: Safety: Goal: Ability to remain free from injury will improve Outcome: Progressing   Problem: Coping: Goal: Coping ability will improve Outcome: Not Progressing   Problem: Self-Concept: Goal: Will verbalize positive feelings about self Outcome: Not Progressing   Problem: Coping: Goal: Level of anxiety will decrease Outcome: Not Progressing

## 2018-04-07 NOTE — Progress Notes (Addendum)
Patient ID: Diana West, female   DOB: 08/25/1956, 61 y.o.   MRN: [id number] CSW left voicemails at [facility].  Spoke with Admissions, [name] at [facility] who says she had spoken with [name] last week but does not have any beds at this time.  She is willing to help her get on waiting list.  CSW also spoke with [name] who has no openings in their ALF section at this time.  CSW will follow up again with [facility] tomorrow.  CSW also spoke with [name] case Manager who says she cannot access Pt's Medicaid information so CSW also called and left voicemail with financial counselor [name] [phone] to see confirm proper funding in place for ALF.  Also left voicemail for [name] to confirm that appointment with [name] had been rescheduled and to discuss challenges with placement.   [name], LCSW

## 2018-04-07 NOTE — Progress Notes (Signed)
Recreation Therapy Notes  Date: 04/07/2018  Time: 9:30 am   Location: Craft room  Behavioral response: N/A   Intervention Topic: Creative Expressions  Discussion/Intervention: Patient did not attend group.   Clinical Observations/Feedback:  Patient did not attend group.   Mercie Balsley LRT/CTRS        [place] 04/07/2018 11:15 AM

## 2018-04-07 NOTE — BHH Group Notes (Signed)
CSW Group Therapy Note  04/07/2018  Time:  0900  Type of Therapy and Topic: Group Therapy: Goals Group: SMART Goals    Participation Level:  Active    Description of Group:   The purpose of a daily goals group is to assist and guide patients in setting recovery/wellness-related goals. The objective is to set goals as they relate to the crisis in which they were admitted. Patients will be using SMART goal modalities to set measurable goals. Characteristics of realistic goals will be discussed and patients will be assisted in setting and processing how one will reach their goal. Facilitator will also assist patients in applying interventions and coping skills learned in psycho-education groups to the SMART goal and process how one will achieve defined goal.    Therapeutic Goals:  -Patients will develop and document one goal related to or their crisis in which brought them into treatment.  -Patients will be guided by LCSW using SMART goal setting modality in how to set a measurable, attainable, realistic and time sensitive goal.  -Patients will process barriers in reaching goal.  -Patients will process interventions in how to overcome and successful in reaching goal.    Patient's Goal:  Pt continues to work towards their tx goals but has not yet reached them. Pt was able to appropriately participate in group discussion, and was able to offer support/validation to other group members. Pt reported her goal for today is, "to speak to the doctor in an assertive way to get my point across."    Therapeutic Modalities:  Motivational Interviewing  Cognitive Behavioral Therapy  Crisis Intervention Model  SMART goals setting  Alden Hipp, MSW, LCSW Clinical Social Worker 04/07/2018 9:56 AM

## 2018-04-07 NOTE — Plan of Care (Signed)
Patient is less intrusive and redirectable today.Patient states "I anxious because its my son's birthday tomorrow. I wish I could sleep tonight."Denies SI,HI and AVH.Attended groups.Compliant with medications.Appetite and energy level good.Support and encouragement given.

## 2018-04-07 NOTE — BHH Group Notes (Signed)
04/07/2018 1PM  Type of Therapy/Topic:  Group Therapy:  Feelings about Diagnosis  Participation Level:  Active   Description of Group:   This group will allow patients to explore their thoughts and feelings about diagnoses they have received. Patients will be guided to explore their level of understanding and acceptance of these diagnoses. Facilitator will encourage patients to process their thoughts and feelings about the reactions of others to their diagnosis and will guide patients in identifying ways to discuss their diagnosis with significant others in their lives. This group will be process-oriented, with patients participating in exploration of their own experiences, giving and receiving support, and processing challenge from other group members.   Therapeutic Goals: 1. Patient will demonstrate understanding of diagnosis as evidenced by identifying two or more symptoms of the disorder 2. Patient will be able to express two feelings regarding the diagnosis 3. Patient will demonstrate their ability to communicate their needs through discussion and/or role play  Summary of Patient Progress: Actively and appropriately engaged in the group. Patient was able to provide support and validation to other group members.Patient practiced active listening when interacting with the facilitator and other group members. Patient reports a symptom of her diagnosis as "worry and frustration". She says that when she was diagnosed her feelings were "sad and more worried". Her way of coping is through "listening to music". Patient is still in the process of obtaining treatment goals.        Therapeutic Modalities:   Cognitive Behavioral Therapy Brief Therapy Feelings Identification    Darin Engels, Marlinda Mike 04/07/2018 3:33 PM

## 2018-04-07 NOTE — NC FL2 (Addendum)
[place] LEVEL OF CARE SCREENING TOOL     IDENTIFICATION  Patient Name: Diana West Birthdate: Aug 18, 1956 Sex: female Admission Date (Current Location): 04/05/2018  [facility] and [place] Number:  Horticulturist, commercial medicaid in process Facility and Address:  [facility], [address], [place], [place] [address]      Provider Number: [id number]  Attending Physician Name and Address:  Clovis Fredrickson, MD  Relative Name and Phone Number:       Current Level of Care: Hospital Recommended Level of Care: [place] Prior Approval Number:    Date Approved/Denied:   PASRR Number:    Discharge Plan: Other (Comment)(Assisted Living Facility)    Current Diagnoses: Patient Active Problem List   Diagnosis Date Noted  . Tobacco use disorder 04/05/2018  . Severe episode of recurrent major depressive disorder, with psychotic features ([place])   . Generalized anxiety disorder   . Suicide attempt by multiple drug overdose ([place]) 04/01/2018  . Hypothyroidism 04/01/2018  . Acute encephalopathy 03/17/2018  . Recurrent moderate major depressive disorder with anxiety ([place]) 03/16/2018    Class: Acute  . Pressure injury of skin 03/16/2018  . Metabolic encephalopathy 41/32/4401  . Malnutrition of moderate degree 03/16/2018  . Acute UTI 02/13/2018  . Pancreatic cyst 11/12/2017  . Protein-calorie malnutrition, severe 07/23/2017  . Acute cystitis 07/22/2017  . TIA (transient ischemic attack) 07/06/2017  . Hypotension 06/26/2017  . Falls frequently 06/26/2017  . Anemia 06/26/2017  . Depression with anxiety 06/26/2017  . FH: premature coronary heart disease 06/26/2017  . Lumbar disc disease 06/26/2017  . Cervical disc disease 06/26/2017  . Chronic headaches 06/26/2017  . History of PSVT (paroxysmal supraventricular tachycardia) 06/26/2017  . Tinea corporis 06/26/2017  . Postherpetic neuralgia 06/26/2017  . CKD  (chronic kidney disease) stage 3, GFR 30-59 ml/min (HCC) 06/26/2017    Orientation RESPIRATION BLADDER Height & Weight     Self, Time, Situation, Place  Normal Continent Weight: 106 lb (48.1 kg) Height:  5' 6.93" (170 cm)  BEHAVIORAL SYMPTOMS/MOOD NEUROLOGICAL BOWEL NUTRITION STATUS      Continent    AMBULATORY STATUS COMMUNICATION OF NEEDS Skin   Independent Verbally Normal                       Personal Care Assistance Level of Assistance  Bathing, Feeding, Dressing Bathing Assistance: Independent Feeding assistance: Independent Dressing Assistance: Independent     Functional Limitations Info  Sight, Hearing, Speech Sight Info: Adequate Hearing Info: Adequate Speech Info: Adequate    SPECIAL CARE FACTORS FREQUENCY                       Contractures Contractures Info: Not present    Additional Factors Info                  Current Medications (04/07/2018):  This is the current hospital active medication list Current Facility-Administered Medications  Medication Dose Route Frequency Provider Last Rate Last Dose  . acetaminophen (TYLENOL) tablet 650 mg  650 mg Oral Q6H PRN He, Jun, MD   650 mg at 04/07/18 0833  . alum & mag hydroxide-simeth (MAALOX/MYLANTA) 200-200-20 MG/5ML suspension 30 mL  30 mL Oral Q4H PRN Chauncey Mann, MD      . cephALEXin [facility]) capsule 500 mg  500 mg Oral TID He, Jun, MD   500 mg at 04/07/18 1216  . chlordiazePOXIDE (LIBRIUM) capsule 10 mg  10  mg Oral TID Javonna Balli B, MD   10 mg at 04/07/18 1216  . feeding supplement (ENSURE ENLIVE) (ENSURE ENLIVE) liquid 237 mL  237 mL Oral TID BM Chauncey Mann, MD   237 mL at 04/07/18 1021  . gabapentin (NEURONTIN) tablet 300 mg  300 mg Oral TID He, Jun, MD   300 mg at 04/07/18 1216  . levothyroxine (SYNTHROID, LEVOTHROID) tablet 125 mcg  125 mcg Oral QAC breakfast He, Jun, MD   125 mcg at 04/07/18 0608  . lidocaine (LIDODERM) 5 % 1 patch  1 patch Transdermal Q24H Kamori Kitchens,  Jonella Redditt B, MD   1 patch at 04/07/18 1216  . magnesium hydroxide (MILK OF MAGNESIA) suspension 30 mL  30 mL Oral Daily PRN Chauncey Mann, MD      . multivitamin with minerals tablet 1 tablet  1 tablet Oral Daily Chauncey Mann, MD   1 tablet at 04/07/18 0829  . nicotine (NICODERM CQ - dosed in mg/24 hours) patch 21 mg  21 mg Transdermal Daily Tishanna Dunford B, MD   21 mg at 04/07/18 0829  . risperiDONE (RISPERDAL) tablet 2 mg  2 mg Oral QHS Latonyia Lopata B, MD   2 mg at 04/06/18 2141  . venlafaxine XR (EFFEXOR-XR) 24 hr capsule 150 mg  150 mg Oral Q breakfast Evelyn Aguinaldo B, MD   150 mg at 04/07/18 6283     Discharge Medications: Please see discharge summary for a list of discharge medications.  Relevant Imaging Results:  Relevant Lab Results:   Additional Information    August Saucer, LCSW

## 2018-04-08 LAB — CULTURE, BLOOD (ROUTINE X 2): Special Requests: ADEQUATE

## 2018-04-08 MED ORDER — QUETIAPINE FUMARATE 100 MG PO TABS: 100.0000 mg | ORAL_TABLET | Freq: Every day | ORAL | 1 refills | Status: AC

## 2018-04-08 MED ORDER — CEPHALEXIN 500 MG PO CAPS: 500.0000 mg | ORAL_CAPSULE | Freq: Three times a day (TID) | ORAL | 0 refills | Status: AC

## 2018-04-08 MED ORDER — VENLAFAXINE HCL ER 150 MG PO CP24: 150.0000 mg | ORAL_CAPSULE | Freq: Every day | ORAL | 1 refills | Status: AC

## 2018-04-08 MED ORDER — GABAPENTIN 600 MG PO TABS: 600.0000 mg | ORAL_TABLET | Freq: Three times a day (TID) | ORAL | 1 refills | Status: AC

## 2018-04-08 MED ORDER — LIDOCAINE 5 % EX PTCH: 1.0000 | MEDICATED_PATCH | CUTANEOUS | 0 refills | Status: AC

## 2018-04-08 NOTE — Tx Team (Addendum)
Interdisciplinary Treatment and Diagnostic Plan Update  04/06/2018 Time of Session:10:30am Diana West MRN: [id number]  Principal Diagnosis: Severe episode of recurrent major depressive disorder, with psychotic features ([place])  Secondary Diagnoses: Principal Problem:   Severe episode of recurrent major depressive disorder, with psychotic features ([place]) Active Problems:   Protein-calorie malnutrition, severe   Pancreatic cyst   Suicide attempt by multiple drug overdose ([place])   Hypothyroidism   Tobacco use disorder   Current Medications:  Current Facility-Administered Medications  Medication Dose Route Frequency Provider Last Rate Last Dose  . acetaminophen (TYLENOL) tablet 650 mg  650 mg Oral Q6H PRN He, Jun, MD   650 mg at 04/08/18 0823  . alum & mag hydroxide-simeth (MAALOX/MYLANTA) 200-200-20 MG/5ML suspension 30 mL  30 mL Oral Q4H PRN Chauncey Mann, MD      . cephALEXin [facility]) capsule 500 mg  500 mg Oral TID He, Jun, MD   500 mg at 04/08/18 0825  . chlordiazePOXIDE (LIBRIUM) capsule 10 mg  10 mg Oral TID Pucilowska, Jolanta B, MD   10 mg at 04/08/18 0825  . feeding supplement (ENSURE ENLIVE) (ENSURE ENLIVE) liquid 237 mL  237 mL Oral TID BM Chauncey Mann, MD   237 mL at 04/08/18 1048  . gabapentin (NEURONTIN) tablet 600 mg  600 mg Oral TID Pucilowska, Jolanta B, MD   600 mg at 04/08/18 0825  . levothyroxine (SYNTHROID, LEVOTHROID) tablet 125 mcg  125 mcg Oral QAC breakfast He, Jun, MD   125 mcg at 04/08/18 7564  . lidocaine (LIDODERM) 5 % 1 patch  1 patch Transdermal Q24H Pucilowska, Jolanta B, MD   1 patch at 04/08/18 1028  . magnesium hydroxide (MILK OF MAGNESIA) suspension 30 mL  30 mL Oral Daily PRN Chauncey Mann, MD      . multivitamin with minerals tablet 1 tablet  1 tablet Oral Daily Chauncey Mann, MD   1 tablet at 04/08/18 0825  . nicotine (NICODERM CQ - dosed in mg/24 hours) patch 21 mg  21 mg Transdermal Daily Pucilowska, Jolanta B, MD   21 mg at 04/08/18 0825   . QUEtiapine (SEROQUEL) tablet 100 mg  100 mg Oral QHS Pucilowska, Jolanta B, MD   100 mg at 04/07/18 2137  . venlafaxine XR (EFFEXOR-XR) 24 hr capsule 150 mg  150 mg Oral Q breakfast Pucilowska, Jolanta B, MD   150 mg at 04/08/18 0825   PTA Medications: Medications Prior to Admission  Medication Sig Dispense Refill Last Dose  . citalopram (CELEXA) 20 MG tablet Take 1 tablet (20 mg total) by mouth daily.   04/05/2018 at Unknown time  . levothyroxine (SYNTHROID, LEVOTHROID) 125 MCG tablet Take 125 mcg by mouth daily before breakfast.   04/05/2018 at Unknown time  . risperiDONE (RISPERDAL) 0.5 MG tablet Take 1 tablet (0.5 mg total) by mouth at bedtime.       Patient Stressors: Health problems  Patient Strengths: Ability for insight Active sense of humor Average or above average intelligence Capable of independent living Communication skills Supportive family/friends  Treatment Modalities: Medication Management, Group therapy, Case management,  1 to 1 session with clinician, Psychoeducation, Recreational therapy.   Physician Treatment Plan for Primary Diagnosis: Severe episode of recurrent major depressive disorder, with psychotic features ([place]) Long Term Goal(s): Improvement in symptoms so as ready for discharge Improvement in symptoms so as ready for discharge   Short Term Goals: Ability to identify changes in lifestyle to reduce recurrence of condition will improve Ability  to verbalize feelings will improve Ability to disclose and discuss suicidal ideas Ability to demonstrate self-control will improve Ability to identify and develop effective coping behaviors will improve Ability to maintain clinical measurements within normal limits will improve Compliance with prescribed medications will improve Ability to identify triggers associated with substance abuse/mental health issues will improve Ability to identify changes in lifestyle to reduce recurrence of condition will  improve Ability to demonstrate self-control will improve Ability to identify triggers associated with substance abuse/mental health issues will improve  Medication Management: Evaluate patient's response, side effects, and tolerance of medication regimen.  Therapeutic Interventions: 1 to 1 sessions, Unit Group sessions and Medication administration.  Evaluation of Outcomes: Progressing  Physician Treatment Plan for Secondary Diagnosis: Principal Problem:   Severe episode of recurrent major depressive disorder, with psychotic features ([place]) Active Problems:   Protein-calorie malnutrition, severe   Pancreatic cyst   Suicide attempt by multiple drug overdose ([place])   Hypothyroidism   Tobacco use disorder  Long Term Goal(s): Improvement in symptoms so as ready for discharge Improvement in symptoms so as ready for discharge   Short Term Goals: Ability to identify changes in lifestyle to reduce recurrence of condition will improve Ability to verbalize feelings will improve Ability to disclose and discuss suicidal ideas Ability to demonstrate self-control will improve Ability to identify and develop effective coping behaviors will improve Ability to maintain clinical measurements within normal limits will improve Compliance with prescribed medications will improve Ability to identify triggers associated with substance abuse/mental health issues will improve Ability to identify changes in lifestyle to reduce recurrence of condition will improve Ability to demonstrate self-control will improve Ability to identify triggers associated with substance abuse/mental health issues will improve     Medication Management: Evaluate patient's response, side effects, and tolerance of medication regimen.  Therapeutic Interventions: 1 to 1 sessions, Unit Group sessions and Medication administration.  Evaluation of Outcomes: Progressing   RN Treatment Plan for Primary Diagnosis: Severe episode of  recurrent major depressive disorder, with psychotic features ([place]) Long Term Goal(s): Knowledge of disease and therapeutic regimen to maintain health will improve  Short Term Goals: Ability to identify and develop effective coping behaviors will improve and Compliance with prescribed medications will improve  Medication Management: RN will administer medications as ordered by provider, will assess and evaluate patient's response and provide education to patient for prescribed medication. RN will report any adverse and/or side effects to prescribing provider.  Therapeutic Interventions: 1 on 1 counseling sessions, Psychoeducation, Medication administration, Evaluate responses to treatment, Monitor vital signs and CBGs as ordered, Perform/monitor CIWA, COWS, AIMS and Fall Risk screenings as ordered, Perform wound care treatments as ordered.  Evaluation of Outcomes: Progressing   LCSW Treatment Plan for Primary Diagnosis: Severe episode of recurrent major depressive disorder, with psychotic features ([place]) Long Term Goal(s): Safe transition to appropriate next level of care at discharge, Engage patient in therapeutic group addressing interpersonal concerns.  Short Term Goals: Engage patient in aftercare planning with referrals and resources, Identify triggers associated with mental health/substance abuse issues and Increase skills for wellness and recovery  Therapeutic Interventions: Assess for all discharge needs, 1 to 1 time with Social worker, Explore available resources and support systems, Assess for adequacy in community support network, Educate family and significant other(s) on suicide prevention, Complete Psychosocial Assessment, Interpersonal group therapy.  Evaluation of Outcomes: Progressing   Progress in Treatment: Attending groups: Yes. Participating in groups: Yes. Taking medication as prescribed: Yes. Toleration medication: Yes. Family/Significant other  contact made: Yes,  individual(s) contacted:  son alex Patient understands diagnosis: Yes. Discussing patient identified problems/goals with staff: Yes. Medical problems stabilized or resolved: Yes. Denies suicidal/homicidal ideation: Yes. Issues/concerns per patient self-inventory: Yes. Other:    New problem(s) identified: No, Describe:     New Short Term/Long Term Goal(s):  Patient Goals:  To feel better and make plans to get home and eventually go to assisted living  Discharge Plan or Barriers: Follow up with [facility] in [place].  Discharge home to apartment with assistance from son in pursuing ALF placement  Reason for Continuation of Hospitalization: Anxiety Depression Medication stabilization Suicidal ideation  Estimated Length of Stay:3-5 days Recreational Therapy: Patient Stressors: Family  Patient Goal: Patient will engage in groups without prompting or encouragement from LRT x3 group sessions within 5 recreation therapy group sessions Attendees: Patient:Ronnita Liebert 04/08/2018 10:56 AM  Physician: Orson Slick, MD 04/08/2018 10:56 AM  Nursing: Elige Radon, RN 04/08/2018 10:56 AM  RN Care Manager: 04/08/2018 10:56 AM  Social Worker: [name], LCSW 04/08/2018 10:56 AM  Recreational Therapist: Roanna Epley, LRT 04/08/2018 10:56 AM  Other: Alden Hipp, LCSW 04/08/2018 10:56 AM  Other: Darin Engels, [place] 04/08/2018 10:56 AM  Other: 04/08/2018 10:56 AM    Scribe for Treatment Team: August Saucer, LCSW 04/06/2018 10:56 AM

## 2018-04-08 NOTE — Progress Notes (Signed)
Recreation Therapy Notes  INPATIENT RECREATION TR PLAN  Patient Details Name: Diana West MRN: [id number] DOB: 1956/02/02 Today's Date: 04/08/2018  Rec Therapy Plan Is patient appropriate for Therapeutic Recreation?: Yes Treatment times per week: at least 3 Estimated Length of Stay: 5-7 days TR Treatment/Interventions: Group participation (Comment)  Discharge Criteria Pt will be discharged from therapy if:: Discharged Treatment plan/goals/alternatives discussed and agreed upon by:: Patient/family  Discharge Summary Short term goals set: Patient will engage in groups without prompting or encouragement from LRT x3 group sessions within 5 recreation therapy group sessions Short term goals met: Not met Progress toward goals comments: Groups attended Which groups?: Communication Reason goals not met: Patient spent most of her time in her room Therapeutic equipment acquired: N/A Reason patient discharged from therapy: Discharge from hospital Pt/family agrees with progress & goals achieved: Yes Date patient discharged from therapy: 04/08/18   Ryanne Morand 04/08/2018, 3:46 PM

## 2018-04-08 NOTE — Plan of Care (Addendum)
1754 During pm medication pass patient presented to medication room upset.  Demanding that the doctor be called because she is not doing well at all and that she needed something for anxiety.  Then patient started crying stating "It's my sons birthday and I have not been able to talk to him."  Denies SI/HI/AVH.  Denies any depression.  Verbalizes that has some anxiety.  Requires frequent direction.  Hyper-verbal at times.  Speech tangential.  Presents frequently to nurses station with various requests.   Problem: Education: Goal: Ability to make informed decisions regarding treatment will improve Outcome: Progressing   Problem: Coping: Goal: Coping ability will improve Outcome: Progressing   Problem: Self-Concept: Goal: Ability to disclose and discuss suicidal ideas will improve Outcome: Progressing Goal: Will verbalize positive feelings about self Outcome: Progressing   Problem: Education: Goal: Knowledge of General Education information will improve Outcome: Progressing   Problem: Health Behavior/Discharge Planning: Goal: Ability to manage health-related needs will improve Outcome: Progressing   Problem: Clinical Measurements: Goal: Ability to maintain clinical measurements within normal limits will improve Outcome: Progressing Goal: Will remain free from infection Outcome: Progressing Goal: Diagnostic test results will improve Outcome: Progressing Goal: Respiratory complications will improve Outcome: Progressing Goal: Cardiovascular complication will be avoided Outcome: Progressing   Problem: Activity: Goal: Risk for activity intolerance will decrease Outcome: Progressing   Problem: Nutrition: Goal: Adequate nutrition will be maintained Outcome: Progressing   Problem: Coping: Goal: Level of anxiety will decrease Outcome: Progressing   Problem: Elimination: Goal: Will not experience complications related to bowel motility Outcome: Progressing Goal: Will not  experience complications related to urinary retention Outcome: Progressing   Problem: Pain Managment: Goal: General experience of comfort will improve Outcome: Progressing   Problem: Safety: Goal: Ability to remain free from injury will improve Outcome: Progressing   Problem: Skin Integrity: Goal: Risk for impaired skin integrity will decrease Outcome: Progressing

## 2018-04-08 NOTE — Progress Notes (Signed)
Recreation Therapy Notes  INPATIENT RECREATION THERAPY ASSESSMENT  Patient Details Name: Alek Poncedeleon MRN: [id number] DOB: 11-Mar-1956 Today's Date: 04/08/2018       Information Obtained From: Patient  Able to Participate in Assessment/Interview: Yes  Patient Presentation: Responsive  Reason for Admission (Per Patient): Suicide Attempt  Patient Stressors: Family  Coping Skills:   Music, Talk  Leisure Interests (2+):  Social - Friends, Social - Family, Joretta Bachelor care  Frequency of Recreation/Participation: Monthly  Awareness of Community Resources:     [employer]:     Current Use:    If no, Barriers?:    Expressed Interest in [place] of Residence:  Insurance underwriter  Patient Main Form of Transportation: Other (Comment)(People take me places I need to go)  Patient Strengths:  Care about others, try to please others  Patient Identified Areas of Improvement:  Make more time for me  Patient Goal for Hospitalization:  Talk to others  Current SI (including self-harm):  No  Current HI:  No  Current AVH: No  Staff Intervention Plan: Group Attendance, Collaborate with Interdisciplinary Treatment Team  Consent to Intern Participation: N/A  Kyleigh Nannini 04/08/2018, 9:09 AM

## 2018-04-08 NOTE — BHH Suicide Risk Assessment (Signed)
[facility] Discharge Suicide Risk Assessment   Principal Problem: Severe episode of recurrent major depressive disorder, with psychotic features [facility]) Discharge Diagnoses:  Patient Active Problem List   Diagnosis Date Noted  . Severe episode of recurrent major depressive disorder, with psychotic features ([place]) [F33.3]     Priority: High  . Recurrent moderate major depressive disorder with anxiety ([place]) [F33.1, F41.9] 03/16/2018    Priority: High    Class: Acute  . Tobacco use disorder [F17.200] 04/05/2018  . Generalized anxiety disorder [F41.1]   . Suicide attempt by multiple drug overdose ([place]) [T50.902A] 04/01/2018  . Hypothyroidism [E03.9] 04/01/2018  . Acute encephalopathy [G93.40] 03/17/2018  . Pressure injury of skin [L89.90] 03/16/2018  . Metabolic encephalopathy [J19.14] 03/16/2018  . Malnutrition of moderate degree [E44.0] 03/16/2018  . Acute UTI [N39.0] 02/13/2018  . Pancreatic cyst [K86.2] 11/12/2017  . Protein-calorie malnutrition, severe [E43] 07/23/2017  . Acute cystitis [N30.00] 07/22/2017  . TIA (transient ischemic attack) [G45.9] 07/06/2017  . Hypotension [I95.9] 06/26/2017  . Falls frequently [R29.6] 06/26/2017  . Anemia [D64.9] 06/26/2017  . Depression with anxiety [F41.8] 06/26/2017  . FH: premature coronary heart disease [Z82.49] 06/26/2017  . Lumbar disc disease [M51.9] 06/26/2017  . Cervical disc disease [M50.90] 06/26/2017  . Chronic headaches [R51] 06/26/2017  . History of PSVT (paroxysmal supraventricular tachycardia) [Z86.79] 06/26/2017  . Tinea corporis [B35.4] 06/26/2017  . Postherpetic neuralgia [B02.29] 06/26/2017  . CKD (chronic kidney disease) stage 3, GFR 30-59 ml/min (HCC) [N18.3] 06/26/2017    Total Time spent with patient: 20 minutes  Musculoskeletal: Strength & Muscle Tone: within normal limits Gait & Station: normal Patient leans: N/A  Psychiatric Specialty Exam: Review of Systems  Neurological: Negative.   Psychiatric/Behavioral:  Negative.   All other systems reviewed and are negative.   Blood pressure (!) 123/59, pulse (!) 48, temperature 98.4 F (36.9 C), temperature source Oral, resp. rate 16, height 5' 6.93" (1.7 m), weight 48.1 kg (106 lb), SpO2 99 %.Body mass index is 16.64 kg/m.  General Appearance: Casual  Eye Contact::  Good  Speech:  Clear and Coherent409  Volume:  Normal  Mood:  Euthymic  Affect:  Appropriate  Thought Process:  Goal Directed and Descriptions of Associations: Intact  Orientation:  Full (Time, Place, and Person)  Thought Content:  WDL  Suicidal Thoughts:  No  Homicidal Thoughts:  No  Memory:  Immediate;   Fair Recent;   Fair Remote;   Fair  Judgement:  Impaired  Insight:  Shallow  Psychomotor Activity:  Normal  Concentration:  Fair  Recall:  [place]  Language: Fair  Akathisia:  No  Handed:  Right  AIMS (if indicated):     Assets:  Communication Skills Desire for Improvement Financial Resources/Insurance Housing Physical Health Resilience Social Support  Sleep:  Number of Hours: 6  Cognition: WNL  ADL's:  Intact   Mental Status Per Nursing Assessment::   On Admission:  Suicidal ideation indicated by patient, Self-harm behaviors, Belief that plan would result in death  Demographic Factors:  Divorced or widowed, Caucasian and Living alone  Loss Factors: Loss of significant relationship and Decline in physical health  Historical Factors: Prior suicide attempts, Family history of mental illness or substance abuse and Impulsivity  Risk Reduction Factors:   Sense of responsibility to family, Positive social support and Positive therapeutic relationship  Continued Clinical Symptoms:  Depression:   Impulsivity  Cognitive Features That Contribute To Risk:  None    Suicide Risk:  Minimal: No  identifiable suicidal ideation.  Patients presenting with no risk factors but with morbid ruminations; may be classified as minimal risk based on the  severity of the depressive symptoms  Follow-up [place]. Go on 04/14/2018.   Why:  04/14/18 at 10:45am for Hospital Follow up and continuing medication management. Contact information: [address] [place] [address]        [facility] Follow up.   Specialty:  Behavioral Health Why:  Please consider scheduling appointment for counseling Contact information: [place] [phone]          Plan Of Care/Follow-up recommendations:  Activity:  as tolerated Diet:  low sodium heart healthy Other:  keep follow up appointments  Orson Slick, MD 04/09/2018, 8:27 AM

## 2018-04-08 NOTE — Discharge Summary (Signed)
Physician Discharge Summary Note  Patient:  Diana West is an 62 y.o., female MRN:  [id number] DOB:  01-09-1956 Patient phone:  [phone] (home)  Patient address:   [place] [address],  Total Time spent with patient: 20 minutes plus 15 min on care coordination and documentation.  Date of Admission:  04/05/2018 Date of Discharge: 04/09/2018  Reason for Admission:  Overdose.  History of Present Illness:   Identifying data. Diana West is a 62 year old female with a history of depression and anxiety.  Chief complaint. "I need my nerve pill."  History of present illness. Information was obtained from the patient and the chart. The patient was transferred from ICU where she was hospitalized after overdose on multiple medications requiring intubation. She denies feeling suicidal now but admits that she felt sorry for herself believing that only her cat loves her. She is rather disappointed in her family. She relocated to [place] to be closer to her son and take care of her grandson. She however developed multiple health problems including falls, fainting episodes that preclude her from driving, memory problems and confusion. She has been hospitalized several times for UTI. Her son has been very worried about her as she has been abuding/misusing Xanax that is prescribed by her primary psychiatrist in [place]. The patient denies any symptoms of depression or psychosis. She endorses high anxiety. She did not get any benzos since 6/28 and does not appear anxious but she feels anxious inside. There are no alcohol or other drugs involved.   Past psychiatric history. One previous hospitalization while in crisis but no suicide attempts. Has a psychiatrist and did well on Zoloft, Elavil and Xanax 1 mg TID. She is unwilling to stop Xanax, in spite of confusion and falls. She will not allow her son to manage her Xanax and frequently "runs out".   Family psychiatric history.  None.  Social history. She lives by herself but took steps to relocate to assisted living facility. She was diagnosed with pancreatic cancer and will start chemo soon.    Principal Problem: Severe episode of recurrent major depressive disorder, with psychotic features [facility]) Discharge Diagnoses: Patient Active Problem List   Diagnosis Date Noted  . Severe episode of recurrent major depressive disorder, with psychotic features ([place]) [F33.3]     Priority: High  . Recurrent moderate major depressive disorder with anxiety ([place]) [F33.1, F41.9] 03/16/2018    Priority: High    Class: Acute  . Tobacco use disorder [F17.200] 04/05/2018  . Generalized anxiety disorder [F41.1]   . Suicide attempt by multiple drug overdose ([place]) [T50.902A] 04/01/2018  . Hypothyroidism [E03.9] 04/01/2018  . Acute encephalopathy [G93.40] 03/17/2018  . Pressure injury of skin [L89.90] 03/16/2018  . Metabolic encephalopathy [G81.15] 03/16/2018  . Malnutrition of moderate degree [E44.0] 03/16/2018  . Acute UTI [N39.0] 02/13/2018  . Pancreatic cyst [K86.2] 11/12/2017  . Protein-calorie malnutrition, severe [E43] 07/23/2017  . Acute cystitis [N30.00] 07/22/2017  . TIA (transient ischemic attack) [G45.9] 07/06/2017  . Hypotension [I95.9] 06/26/2017  . Falls frequently [R29.6] 06/26/2017  . Anemia [D64.9] 06/26/2017  . Depression with anxiety [F41.8] 06/26/2017  . FH: premature coronary heart disease [Z82.49] 06/26/2017  . Lumbar disc disease [M51.9] 06/26/2017  . Cervical disc disease [M50.90] 06/26/2017  . Chronic headaches [R51] 06/26/2017  . History of PSVT (paroxysmal supraventricular tachycardia) [Z86.79] 06/26/2017  . Tinea corporis [B35.4] 06/26/2017  . Postherpetic neuralgia [B02.29] 06/26/2017  . CKD (chronic kidney disease) stage 3, GFR 30-59 ml/min (HCC) [N18.3] 06/26/2017  Past Medical History:  Past Medical History:  Diagnosis Date  . Anxiety   . Anxiety   . Arthritis   . Cardiac arrhythmia    . Depression   . Falls frequently   . Hypothyroidism   . Palpitation   . Shingles    she states "they are inside and not contagious"  . TIA (transient ischemic attack)     Past Surgical History:  Procedure Laterality Date  . ABDOMINAL HYSTERECTOMY    . CESAREAN SECTION    . CHOLECYSTECTOMY     Family History:  Family History  Problem Relation Age of Onset  . CAD Mother   . CAD Father     Social History:  Social History   Substance and Sexual Activity  Alcohol Use No     Social History   Substance and Sexual Activity  Drug Use No    Social History   Socioeconomic History  . Marital status: Divorced    Spouse name: Not on file  . Number of children: Not on file  . Years of education: Not on file  . Highest education level: Not on file  Occupational History  . Not on file  Social Needs  . Financial resource strain: Not on file  . Food insecurity:    Worry: Not on file    Inability: Not on file  . Transportation needs:    Medical: Not on file    Non-medical: Not on file  Tobacco Use  . Smoking status: Current Some Day Smoker    Packs/day: 0.25    Years: 10.00    Pack years: 2.50    Types: Cigarettes  . Smokeless tobacco: Never Used  Substance and Sexual Activity  . Alcohol use: No  . Drug use: No  . Sexual activity: Not on file  Lifestyle  . Physical activity:    Days per week: Not on file    Minutes per session: Not on file  . Stress: Not on file  Relationships  . Social connections:    Talks on phone: Not on file    Gets together: Not on file    Attends religious service: Not on file    Active member of club or organization: Not on file    Attends meetings of clubs or organizations: Not on file    Relationship status: Not on file  Other Topics Concern  . Not on file  Social History Narrative   Lives at home by herself. Has a walker to ambulate    Hospital Course:    Diana West is a 62 year old female with a history of psychotic  depression and anxiety transferred from ICU where she was hospitalized after overdose on multiple medications. She tolerated medication adjustment well, including discontinuation of Xanax. Given history of falls, cognitive declines and misuse of Xanax, we strongly recommend that Xanax be discontinued permanently. At the time of discharge, the patient is not suicidal, homicidal, psychotic or excessively anxious. She is able to contract for safety. She is forward thinking and optimistic about the future.  #Mood/psychosis,improved -continue Effexor 150 mg daily  -continue Seroquel 100 mg nightly   #Anxiety -continue Neurontinto 600 mgTID  #Benzodiazepine misuse -patient completed librium taper  #Hypothyroidism -Synthroid 125 ug daily  #Hip pain -Lidoderm patch  #Pneumonia -continue Keflex 500 mg TID for 2 more days  #Smoking cessation -nicotine patchis available  #Labs -lipid paneland A1C are normal -EKG reviewed, NSR with first degree AV block and QTc 465  #Disposition -discharge to  home with her son  -family will pursue placement, FL2 completed -follow up with [facility]       Physical Findings: AIMS:  , ,  ,  ,    CIWA:    COWS:     Musculoskeletal: Strength & Muscle Tone: within normal limits Gait & Station: normal Patient leans: N/A  Psychiatric Specialty Exam: Physical Exam  Nursing note and vitals reviewed. Psychiatric: She has a normal mood and affect. Her speech is normal and behavior is normal. Thought content normal. Cognition and memory are normal. She expresses impulsivity.    Review of Systems  Neurological: Negative.   Psychiatric/Behavioral: Negative.   All other systems reviewed and are negative.   Blood pressure (!) 123/59, pulse (!) 48, temperature 98.4 F (36.9 C), temperature source Oral, resp. rate 16, height 5' 6.93" (1.7 m), weight 48.1 kg (106 lb), SpO2 99 %.Body mass index is 16.64 kg/m.  General Appearance: Casual  Eye  Contact:  Good  Speech:  Clear and Coherent  Volume:  Normal  Mood:  Euthymic  Affect:  Appropriate  Thought Process:  Goal Directed and Descriptions of Associations: Intact  Orientation:  Full (Time, Place, and Person)  Thought Content:  WDL  Suicidal Thoughts:  No  Homicidal Thoughts:  No  Memory:  Immediate;   Fair Recent;   Fair Remote;   Fair  Judgement:  Impaired  Insight:  Shallow  Psychomotor Activity:  Normal  Concentration:  Concentration: Fair and Attention Span: Fair  Recall:  [employer] of Knowledge:  Fair  Language:  Fair  Akathisia:  No  Handed:  Right  AIMS (if indicated):     Assets:  Communication Skills Desire for Improvement Financial Resources/Insurance Housing Physical Health Resilience Social Support  ADL's:  Intact  Cognition:  WNL  Sleep:  Number of Hours: 6     Have you used any form of tobacco in the last 30 days? (Cigarettes, Smokeless Tobacco, Cigars, and/or Pipes): Yes  Has this patient used any form of tobacco in the last 30 days? (Cigarettes, Smokeless Tobacco, Cigars, and/or Pipes) Yes, Yes, A prescription for an FDA-approved tobacco cessation medication was offered at discharge and the patient refused  Blood Alcohol level:  Lab Results  Component Value Date   ETH <10 04/01/2018   ETH <10 16/07/9603    Metabolic Disorder Labs:  Lab Results  Component Value Date   HGBA1C 5.2 04/06/2018   MPG 102.54 04/06/2018   No results found for: PROLACTIN Lab Results  Component Value Date   CHOL 186 04/06/2018   TRIG 190 (H) 04/06/2018   HDL 45 04/06/2018   CHOLHDL 4.1 04/06/2018   VLDL 38 04/06/2018   LDLCALC 103 (H) 04/06/2018   [place] 84 06/26/2017    See Psychiatric Specialty Exam and Suicide Risk Assessment completed by Attending Physician prior to discharge.  Discharge destination:  Home  Is patient on multiple antipsychotic therapies at discharge:  No   Has Patient had three or more failed trials of antipsychotic  monotherapy by history:  No  Recommended Plan for Multiple Antipsychotic Therapies: NA  Discharge Instructions    Diet - low sodium heart healthy   Complete by:  As directed    Increase activity slowly   Complete by:  As directed      Allergies as of 04/09/2018      Reactions   Codeine Itching, Rash   Toradol [ketorolac Tromethamine] Rash      Medication List    STOP  taking these medications   citalopram 20 MG tablet Commonly known as:  CELEXA   risperiDONE 0.5 MG tablet Commonly known as:  RISPERDAL     TAKE these medications     Indication  cephALEXin 500 MG capsule Commonly known as:  KEFLEX Take 1 capsule (500 mg total) by mouth 3 (three) times daily.  Indication:  Infection of the Respiratory Tract   gabapentin 600 MG tablet Commonly known as:  NEURONTIN Take 1 tablet (600 mg total) by mouth 3 (three) times daily.  Indication:  Neuropathic Pain   levothyroxine 125 MCG tablet Commonly known as:  SYNTHROID, LEVOTHROID Take 125 mcg by mouth daily before breakfast.  Indication:  Underactive Thyroid   lidocaine 5 % Commonly known as:  LIDODERM Place 1 patch onto the skin daily. Remove & Discard patch within 12 hours or as directed by MD  Indication:  hip pain   QUEtiapine 100 MG tablet Commonly known as:  SEROQUEL Take 1 tablet (100 mg total) by mouth at bedtime.  Indication:  Major Depressive Disorder   venlafaxine XR 150 MG 24 hr capsule Commonly known as:  EFFEXOR-XR Take 1 capsule (150 mg total) by mouth daily with breakfast.  Indication:  Major Depressive Disorder      Follow-up Information    [facility]. Go on 04/14/2018.   Why:  04/14/18 at 10:45am for Hospital Follow up and continuing medication management. Contact information: [address] [place] [address]        [facility] Follow up.   Specialty:  Behavioral Health Why:  Please consider scheduling appointment for counseling Contact  information: [place] [phone]          Follow-up recommendations:  Activity:  as tolerated Diet:  low sodium heart healthy Other:  keep follow up appointment  Comments:    Signed: Orson Slick, MD 04/09/2018, 8:28 AM

## 2018-04-08 NOTE — Progress Notes (Signed)
Patient ID: Diana West, female   DOB: 1956/02/13, 62 y.o.   MRN: [id number] CSW spoke with [name] who would prefer Pt to be discharged tomorrow evening after he gets off of work so that he can remove any left over xanax from her home and make sure everything is safe for her return. CSW educatedhim on what would be needed to continue arranging ALF placement.  Agreed to leave a list of the contacts for him.  ALso discussed alternative physician office locations should he decide taking his mother to [facility] for psychiatry was too much time for him.  [name], LCSW

## 2018-04-08 NOTE — Progress Notes (Signed)
Recreation Therapy Notes  Date: 04/08/2018      Time: 9:30 am  Location: Craft Room  Behavioral response: Appropriate  Intervention Topic: Communication  Discussion/Intervention: Group content today was focused on communication. The group defined communication and ways to communicate with others. Individuals stated reason why communication is important and some reasons to communicate with others. Patients expressed if they thought they were good at communicating with others and ways they could improve their communication skills. The group identified important parts of communication and some experiences they have had in the past with communication. The group participated in the intervention "What is that?", where they had a chance to test out their communication skills and identify ways to improve their communication techniques.  Clinical Observations/Feedback:  Patient came to group and stated that she communicates by talking on the phone. Individual identified facial expressions as nonverbal communication. She was social with peers and staff while participating in the intervention.  Garlan Drewes LRT/CTRS         Deran Barro 04/08/2018 12:13 PM

## 2018-04-08 NOTE — Plan of Care (Signed)
Pt calm this evening and in the milieu watching tv with peers. Pt was asking for pain medication for a headache and she received tylenol 650 mg at 2152. Pt denies SI/HI. Pt compliant with medications. Pt is receptive to treatment and safety maintained on unit. Will continue to monitor. Problem: Education: Goal: Ability to make informed decisions regarding treatment will improve Outcome: Progressing   Problem: Coping: Goal: Coping ability will improve Outcome: Progressing   Problem: Health Behavior/Discharge Planning: Goal: Identification of resources available to assist in meeting health care needs will improve Outcome: Progressing   Problem: Self-Concept: Goal: Ability to disclose and discuss suicidal ideas will improve Outcome: Progressing Goal: Will verbalize positive feelings about self Outcome: Progressing

## 2018-04-08 NOTE — Progress Notes (Signed)
[facility] MD Progress Note  04/08/2018 1:35 PM Semaya Vida  MRN:  [id number]  Subjective:    Ms. Delehanty denies any symptoms of depression, anxiety or psychosis. She is not suicidal or homicidal. She tolerates Librium taper well. She wasnts to be discharged. Her son will be able to pick her up from the hospital tomorrow after he removes any Xanax left from her apartment. She did not sleep at all the night before but almost 6 hours with Seroquel last night.   Principal Problem: Severe episode of recurrent major depressive disorder, with psychotic features ([place]) Diagnosis:   Patient Active Problem List   Diagnosis Date Noted  . Severe episode of recurrent major depressive disorder, with psychotic features ([place]) [F33.3]     Priority: High  . Recurrent moderate major depressive disorder with anxiety ([place]) [F33.1, F41.9] 03/16/2018    Priority: High    Class: Acute  . Tobacco use disorder [F17.200] 04/05/2018  . Generalized anxiety disorder [F41.1]   . Suicide attempt by multiple drug overdose ([place]) [T50.902A] 04/01/2018  . Hypothyroidism [E03.9] 04/01/2018  . Acute encephalopathy [G93.40] 03/17/2018  . Pressure injury of skin [L89.90] 03/16/2018  . Metabolic encephalopathy [P61.95] 03/16/2018  . Malnutrition of moderate degree [E44.0] 03/16/2018  . Acute UTI [N39.0] 02/13/2018  . Pancreatic cyst [K86.2] 11/12/2017  . Protein-calorie malnutrition, severe [E43] 07/23/2017  . Acute cystitis [N30.00] 07/22/2017  . TIA (transient ischemic attack) [G45.9] 07/06/2017  . Hypotension [I95.9] 06/26/2017  . Falls frequently [R29.6] 06/26/2017  . Anemia [D64.9] 06/26/2017  . Depression with anxiety [F41.8] 06/26/2017  . FH: premature coronary heart disease [Z82.49] 06/26/2017  . Lumbar disc disease [M51.9] 06/26/2017  . Cervical disc disease [M50.90] 06/26/2017  . Chronic headaches [R51] 06/26/2017  . History of PSVT (paroxysmal supraventricular tachycardia) [Z86.79] 06/26/2017  . Tinea  corporis [B35.4] 06/26/2017  . Postherpetic neuralgia [B02.29] 06/26/2017  . CKD (chronic kidney disease) stage 3, GFR 30-59 ml/min (HCC) [N18.3] 06/26/2017   Total Time spent with patient: 20 minutes  Past Psychiatric History: depression, anxiety  Past Medical History:  Past Medical History:  Diagnosis Date  . Anxiety   . Anxiety   . Arthritis   . Cardiac arrhythmia   . Depression   . Falls frequently   . Hypothyroidism   . Palpitation   . Shingles    she states "they are inside and not contagious"  . TIA (transient ischemic attack)     Past Surgical History:  Procedure Laterality Date  . ABDOMINAL HYSTERECTOMY    . CESAREAN SECTION    . CHOLECYSTECTOMY     Family History:  Family History  Problem Relation Age of Onset  . CAD Mother   . CAD Father    Family Psychiatric  History: none Social History:  Social History   Substance and Sexual Activity  Alcohol Use No     Social History   Substance and Sexual Activity  Drug Use No    Social History   Socioeconomic History  . Marital status: Divorced    Spouse name: Not on file  . Number of children: Not on file  . Years of education: Not on file  . Highest education level: Not on file  Occupational History  . Not on file  Social Needs  . Financial resource strain: Not on file  . Food insecurity:    Worry: Not on file    Inability: Not on file  . Transportation needs:    Medical: Not on file  Non-medical: Not on file  Tobacco Use  . Smoking status: Current Some Day Smoker    Packs/day: 0.25    Years: 10.00    Pack years: 2.50    Types: Cigarettes  . Smokeless tobacco: Never Used  Substance and Sexual Activity  . Alcohol use: No  . Drug use: No  . Sexual activity: Not on file  Lifestyle  . Physical activity:    Days per week: Not on file    Minutes per session: Not on file  . Stress: Not on file  Relationships  . Social connections:    Talks on phone: Not on file    Gets together: Not on  file    Attends religious service: Not on file    Active member of club or organization: Not on file    Attends meetings of clubs or organizations: Not on file    Relationship status: Not on file  Other Topics Concern  . Not on file  Social History Narrative   Lives at home by herself. Has a walker to ambulate   Additional Social History:                         Sleep: Fair  Appetite:  Fair  Current Medications: Current Facility-Administered Medications  Medication Dose Route Frequency Provider Last Rate Last Dose  . acetaminophen (TYLENOL) tablet 650 mg  650 mg Oral Q6H PRN He, Jun, MD   650 mg at 04/08/18 0823  . alum & mag hydroxide-simeth (MAALOX/MYLANTA) 200-200-20 MG/5ML suspension 30 mL  30 mL Oral Q4H PRN Chauncey Mann, MD      . cephALEXin [facility]) capsule 500 mg  500 mg Oral TID He, Jun, MD   500 mg at 04/08/18 1152  . chlordiazePOXIDE (LIBRIUM) capsule 10 mg  10 mg Oral TID Alexsis Kathman B, MD   10 mg at 04/08/18 1152  . feeding supplement (ENSURE ENLIVE) (ENSURE ENLIVE) liquid 237 mL  237 mL Oral TID BM Chauncey Mann, MD   237 mL at 04/08/18 1048  . gabapentin (NEURONTIN) tablet 600 mg  600 mg Oral TID Francetta Ilg B, MD   600 mg at 04/08/18 1152  . levothyroxine (SYNTHROID, LEVOTHROID) tablet 125 mcg  125 mcg Oral QAC breakfast He, Jun, MD   125 mcg at 04/08/18 4098  . lidocaine (LIDODERM) 5 % 1 patch  1 patch Transdermal Q24H Skya Mccullum B, MD   1 patch at 04/08/18 1028  . magnesium hydroxide (MILK OF MAGNESIA) suspension 30 mL  30 mL Oral Daily PRN Chauncey Mann, MD      . multivitamin with minerals tablet 1 tablet  1 tablet Oral Daily Chauncey Mann, MD   1 tablet at 04/08/18 0825  . nicotine (NICODERM CQ - dosed in mg/24 hours) patch 21 mg  21 mg Transdermal Daily Olaf Mesa B, MD   21 mg at 04/08/18 0825  . QUEtiapine (SEROQUEL) tablet 100 mg  100 mg Oral QHS Nakira Litzau B, MD   100 mg at 04/07/18 2137  . venlafaxine  XR (EFFEXOR-XR) 24 hr capsule 150 mg  150 mg Oral Q breakfast Iziah Cates B, MD   150 mg at 04/08/18 0825    Lab Results: No results found for this or any previous visit (from the past 48 hour(s)).  Blood Alcohol level:  Lab Results  Component Value Date   ETH <10 04/01/2018   ETH <10 11/91/4782    Metabolic  Disorder Labs: Lab Results  Component Value Date   HGBA1C 5.2 04/06/2018   MPG 102.54 04/06/2018   No results found for: PROLACTIN Lab Results  Component Value Date   CHOL 186 04/06/2018   TRIG 190 (H) 04/06/2018   HDL 45 04/06/2018   CHOLHDL 4.1 04/06/2018   VLDL 38 04/06/2018   LDLCALC 103 (H) 04/06/2018   LDLCALC 84 06/26/2017    Physical Findings: AIMS:  , ,  ,  ,    CIWA:    COWS:     Musculoskeletal: Strength & Muscle Tone: within normal limits Gait & Station: normal Patient leans: N/A  Psychiatric Specialty Exam: Physical Exam  Nursing note and vitals reviewed. Psychiatric: She has a normal mood and affect. Her speech is normal and behavior is normal. Thought content normal. Cognition and memory are normal. She expresses impulsivity.    Review of Systems  Neurological: Negative.   Psychiatric/Behavioral: Positive for substance abuse.  All other systems reviewed and are negative.   Blood pressure 122/61, pulse (!) 39, temperature 98 F (36.7 C), temperature source Oral, resp. rate 16, height 5' 6.93" (1.7 m), weight 48.1 kg (106 lb), SpO2 99 %.Body mass index is 16.64 kg/m.  General Appearance: Casual  Eye Contact:  Good  Speech:  Clear and Coherent  Volume:  Normal  Mood:  Euthymic  Affect:  Appropriate  Thought Process:  Goal Directed and Descriptions of Associations: Intact  Orientation:  Full (Time, Place, and Person)  Thought Content:  WDL  Suicidal Thoughts:  No  Homicidal Thoughts:  No  Memory:  Immediate;   Fair Recent;   Fair Remote;   Fair  Judgement:  Poor  Insight:  Shallow  Psychomotor Activity:  Normal   Concentration:  Concentration: Fair and Attention Span: Fair  Recall:  [employer] of Knowledge:  Fair  Language:  Fair  Akathisia:  No  Handed:  Right  AIMS (if indicated):     Assets:  Communication Skills Desire for Improvement Financial Resources/Insurance Housing Physical Health Resilience Social Support  ADL's:  Intact  Cognition:  WNL  Sleep:  Number of Hours: 5.45     Treatment Plan Summary: Daily contact with patient to assess and evaluate symptoms and progress in treatment and Medication management   Ms. Barrientez is a 62 year old female with a history of depression transferred from ICU where she was hospitalized after overdose on multiple medications improving slowly. The son reports paranoia and bizarre behaviors at her apartment that led to her loosing the place.  #Suicidal ideation, resolved -patient able to contract for safety in the hospital  #Mood/psychosis, improved -continue Effexor 150 mg daily  -increase Seroquel to 150 mg nightly   #Anxiety -continue Neurontin to 600 mg TID  #Benzodiazepine misuse -hold Xanax  -continue Librium taper  #Hypothyroidism -Synthroid 125 ug daily  -Hip pain Lidoderm patch  #Gram positive blood culture, likely contamination -no symptoms of infection -spoke with Dr. Karolee Ohs, ID -continue Keflex 500 mg TID for 17 doses  #Smoking cessation -nicotine patchis available  #Labs -lipid panel and A1C are normal -EKG reviewed, NSR with first degree AV block and QTc 465  #Disposition -discharge to home with her son tomorrow -family will pursue placement -follow up with a new provider     Orson Slick, MD 04/08/2018, 1:35 PM

## 2018-04-09 NOTE — Plan of Care (Signed)
Pt cooperative. Pt compliant with medication. Pt denies SI/HI. Pt is receptive to treatment and safety maintained on unit. Will continue to monitor. Problem: Education: Goal: Ability to make informed decisions regarding treatment will improve Outcome: Progressing   Problem: Coping: Goal: Coping ability will improve Outcome: Progressing   Problem: Health Behavior/Discharge Planning: Goal: Identification of resources available to assist in meeting health care needs will improve Outcome: Progressing   Problem: Self-Concept: Goal: Ability to disclose and discuss suicidal ideas will improve Outcome: Progressing Goal: Will verbalize positive feelings about self Outcome: Progressing

## 2018-04-09 NOTE — Progress Notes (Signed)
Patient ID: Diana West, female   DOB: [birth year], 62 y.o.   MRN: [id number]   Discharge Note:  Patient denies SI/HI/AVH at this time. Discharge instructions, AVS, prescriptions, and transition record gone over with patient. Patient belongings returned to patient. Patient agrees to comply with medication management, follow-up visit, and outpatient therapy. Patient questions and concerns addressed and answered. Patient ambulatory off unit. Patient discharged to home with son.

## 2018-04-09 NOTE — Progress Notes (Signed)
  [facility] Adult Case Management Discharge Plan :  Will you be returning to the same living situation after discharge:  Yes,  Pt will be returning to her home. At discharge, do you have transportation home?: Yes,  [name] will be picking her up at discharge Do you have the ability to pay for your medications: Yes,  Pt has medical insurance and financial means.  Release of information consent forms completed and in the chart;  Patient's signature needed at discharge.  Patient to Follow up at: Follow-up [facility]. Go on 04/14/2018.   Why:  04/14/18 at 10:45am for Hospital Follow up and continuing medication management. Contact information: [address] [place] [address]        [facility] Follow up.   Specialty:  Behavioral Health Why:  Please consider scheduling appointment for counseling Contact information: [place] [phone]          Next level of care provider has access to [place] and Suicide Prevention discussed: Yes,  Safety concerns were discussed with pt and her son.  Have you used any form of tobacco in the last 30 days? (Cigarettes, Smokeless Tobacco, Cigars, and/or Pipes): Yes  Has patient been referred to the Quitline?: Patient refused referral  Patient has been referred for addiction treatment: [place], LCSW 04/09/2018, 12:30 PM

## 2018-05-14 ENCOUNTER — Emergency Department: Payer: Medicare Other

## 2018-05-14 ENCOUNTER — Encounter: Payer: Self-pay | Admitting: Emergency Medicine

## 2018-05-14 ENCOUNTER — Other Ambulatory Visit: Payer: Self-pay

## 2018-05-14 ENCOUNTER — Emergency Department
Admission: EM | Admit: 2018-05-14 | Discharge: 2018-05-15 | Disposition: A | Discharge status: Other Acute Inpatient Hospital | Payer: Medicare Other | Attending: Emergency Medicine | Admitting: Emergency Medicine

## 2018-05-14 DIAGNOSIS — K858 Other acute pancreatitis without necrosis or infection: Secondary | ICD-10-CM | POA: Insufficient documentation

## 2018-05-14 DIAGNOSIS — R1013 Epigastric pain: Secondary | ICD-10-CM | POA: Diagnosis present

## 2018-05-14 DIAGNOSIS — N183 Chronic kidney disease, stage 3 (moderate): Secondary | ICD-10-CM | POA: Diagnosis not present

## 2018-05-14 DIAGNOSIS — F17210 Nicotine dependence, cigarettes, uncomplicated: Secondary | ICD-10-CM | POA: Insufficient documentation

## 2018-05-14 DIAGNOSIS — E039 Hypothyroidism, unspecified: Secondary | ICD-10-CM | POA: Insufficient documentation

## 2018-05-14 DIAGNOSIS — Z8673 Personal history of transient ischemic attack (TIA), and cerebral infarction without residual deficits: Secondary | ICD-10-CM | POA: Diagnosis not present

## 2018-05-14 DIAGNOSIS — F329 Major depressive disorder, single episode, unspecified: Secondary | ICD-10-CM | POA: Insufficient documentation

## 2018-05-14 DIAGNOSIS — Z79899 Other long term (current) drug therapy: Secondary | ICD-10-CM | POA: Insufficient documentation

## 2018-05-14 DIAGNOSIS — Z9049 Acquired absence of other specified parts of digestive tract: Secondary | ICD-10-CM | POA: Insufficient documentation

## 2018-05-14 DIAGNOSIS — F419 Anxiety disorder, unspecified: Secondary | ICD-10-CM | POA: Insufficient documentation

## 2018-05-14 LAB — COMPREHENSIVE METABOLIC PANEL
ALBUMIN: 3.9 g/dL (ref 3.5–5.0)
ALT: 12 U/L (ref 0–44)
AST: 21 U/L (ref 15–41)
Alkaline Phosphatase: 71 U/L (ref 38–126)
Anion gap: 5 (ref 5–15)
BILIRUBIN TOTAL: 0.5 mg/dL (ref 0.3–1.2)
BUN: 15 mg/dL (ref 8–23)
CO2: 25 mmol/L (ref 22–32)
CREATININE: 1 mg/dL (ref 0.44–1.00)
Calcium: 9.2 mg/dL (ref 8.9–10.3)
Chloride: 108 mmol/L (ref 98–111)
GFR calc Af Amer: >60 mL/min (ref >60)
GFR calc non Af Amer: 59 mL/min — ABNORMAL LOW (ref >60)
GLUCOSE: 108 mg/dL — AB (ref 70–99)
POTASSIUM: 3.8 mmol/L (ref 3.5–5.1)
Sodium: 138 mmol/L (ref 135–145)
TOTAL PROTEIN: 7.4 g/dL (ref 6.5–8.1)

## 2018-05-14 LAB — CBC
HEMATOCRIT: 32.8 % — AB (ref 35.0–47.0)
Hemoglobin: 11 g/dL — ABNORMAL LOW (ref 12.0–16.0)
MCH: 27.8 pg (ref 26.0–34.0)
MCHC: 33.5 g/dL (ref 32.0–36.0)
MCV: 83 fL (ref 80.0–100.0)
PLATELETS: 370 10*3/uL (ref 150–440)
RBC: 3.96 MIL/uL (ref 3.80–5.20)
RDW: 15.5 % — AB (ref 11.5–14.5)
WBC: 10 10*3/uL (ref 3.6–11.0)

## 2018-05-14 LAB — URINALYSIS, COMPLETE (UACMP) WITH MICROSCOPIC
Bilirubin Urine: NEGATIVE
Glucose, UA: NEGATIVE mg/dL
HGB URINE DIPSTICK: NEGATIVE
Ketones, ur: NEGATIVE mg/dL
NITRITE: NEGATIVE
Protein, ur: NEGATIVE mg/dL
SPECIFIC GRAVITY, URINE: 1.016 (ref 1.005–1.030)
pH: 5 (ref 5.0–8.0)

## 2018-05-14 LAB — TROPONIN I

## 2018-05-14 LAB — LIPASE, BLOOD: Lipase: 6383 U/L — ABNORMAL HIGH (ref 11–51)

## 2018-05-14 MED ORDER — HYDROMORPHONE HCL 1 MG/ML IJ SOLN
INTRAMUSCULAR | Status: AC
  Filled 2018-05-14: qty 1

## 2018-05-14 MED ORDER — MORPHINE SULFATE (PF) 4 MG/ML IV SOLN
4.0000 mg | Freq: Once | INTRAVENOUS | Status: AC
  Administered 2018-05-14: 4 mg via INTRAVENOUS
  Filled 2018-05-14: qty 1

## 2018-05-14 MED ORDER — HYDROMORPHONE HCL 1 MG/ML IJ SOLN
0.5000 mg | Freq: Once | INTRAMUSCULAR | Status: AC
  Administered 2018-05-14: 0.5 mg via INTRAVENOUS

## 2018-05-14 MED ORDER — SODIUM CHLORIDE 0.9 % IV BOLUS
1000.0000 mL | Freq: Once | INTRAVENOUS | Status: AC
  Administered 2018-05-14: 1000 mL via INTRAVENOUS

## 2018-05-14 MED ORDER — MORPHINE SULFATE (PF) 2 MG/ML IV SOLN
INTRAVENOUS | Status: AC
  Filled 2018-05-14: qty 1

## 2018-05-14 MED ORDER — MORPHINE SULFATE (PF) 2 MG/ML IV SOLN: 2.0000 mg | Freq: Once | INTRAVENOUS | Status: DC

## 2018-05-14 MED ORDER — IOHEXOL 300 MG/ML  SOLN
75.0000 mL | Freq: Once | INTRAMUSCULAR | Status: AC | PRN
  Administered 2018-05-14: 75 mL via INTRAVENOUS

## 2018-05-14 MED ORDER — ONDANSETRON HCL 4 MG/2ML IJ SOLN
4.0000 mg | Freq: Once | INTRAMUSCULAR | Status: AC
  Administered 2018-05-14: 4 mg via INTRAVENOUS
  Filled 2018-05-14: qty 2

## 2018-05-14 NOTE — ED Notes (Signed)
Pt returned from CT. ABCs intact. NAD 

## 2018-05-14 NOTE — ED Provider Notes (Signed)
[facility] Emergency Department Provider Note ____________________________________________   First MD Initiated Contact with Patient 05/14/18 2130     (approximate)  I have reviewed the triage vital signs and the nursing notes.   HISTORY  Chief Complaint Abdominal Pain    HPI Diana West is a 62 y.o. female with PMH as noted below who presents with abdominal pain, acute onset approximately 10 hours ago, persistent and worsening since then, and mainly epigastric.  The patient states that she had an endoscopic biopsy for a pancreatic cyst this morning.  She states she went home around 11 AM, and the pain started about an hour later.  She reports nausea but no vomiting, and states that she has had a bowel movement.  She denies fever.  She states that she called the gastroenterologist and was told to come to the emergency department for evaluation.  Past Medical History:  Diagnosis Date  . Anxiety   . Anxiety   . Arthritis   . Cardiac arrhythmia   . Depression   . Falls frequently   . Hypothyroidism   . Palpitation   . Shingles    she states "they are inside and not contagious"  . TIA (transient ischemic attack)     Patient Active Problem List   Diagnosis Date Noted  . Tobacco use disorder 04/05/2018  . Severe episode of recurrent major depressive disorder, with psychotic features ([place])   . Generalized anxiety disorder   . Suicide attempt by multiple drug overdose ([place]) 04/01/2018  . Hypothyroidism 04/01/2018  . Acute encephalopathy 03/17/2018  . Recurrent moderate major depressive disorder with anxiety ([place]) 03/16/2018    Class: Acute  . Pressure injury of skin 03/16/2018  . Metabolic encephalopathy 85/46/2703  . Malnutrition of moderate degree 03/16/2018  . Acute UTI 02/13/2018  . Pancreatic cyst 11/12/2017  . Protein-calorie malnutrition, severe 07/23/2017  . Acute cystitis 07/22/2017  . TIA (transient ischemic attack) 07/06/2017    . Hypotension 06/26/2017  . Falls frequently 06/26/2017  . Anemia 06/26/2017  . Depression with anxiety 06/26/2017  . FH: premature coronary heart disease 06/26/2017  . Lumbar disc disease 06/26/2017  . Cervical disc disease 06/26/2017  . Chronic headaches 06/26/2017  . History of PSVT (paroxysmal supraventricular tachycardia) 06/26/2017  . Tinea corporis 06/26/2017  . Postherpetic neuralgia 06/26/2017  . CKD (chronic kidney disease) stage 3, GFR 30-59 ml/min (HCC) 06/26/2017    Past Surgical History:  Procedure Laterality Date  . ABDOMINAL HYSTERECTOMY    . CESAREAN SECTION    . CHOLECYSTECTOMY      Prior to Admission medications   Medication Sig Start Date End Date Taking? Authorizing Provider  cephALEXin (KEFLEX) 500 MG capsule Take 1 capsule (500 mg total) by mouth 3 (three) times daily. Patient not taking: Reported on 05/14/2018 04/09/18   Pucilowska, Herma Ard B, MD  gabapentin (NEURONTIN) 600 MG tablet Take 1 tablet (600 mg total) by mouth 3 (three) times daily. 04/08/18   Pucilowska, Jolanta B, MD  levothyroxine (SYNTHROID, LEVOTHROID) 125 MCG tablet Take 125 mcg by mouth daily before breakfast.    [provider]  lidocaine (LIDODERM) 5 % Place 1 patch onto the skin daily. Remove & Discard patch within 12 hours or as directed by MD 04/09/18   Pucilowska, Wardell Honour, MD  QUEtiapine (SEROQUEL) 100 MG tablet Take 1 tablet (100 mg total) by mouth at bedtime. 04/08/18   Pucilowska, Jolanta B, MD  venlafaxine XR (EFFEXOR-XR) 150 MG 24 hr capsule Take 1  capsule (150 mg total) by mouth daily with breakfast. 04/09/18   Pucilowska, Wardell Honour, MD    Allergies Codeine and Toradol [ketorolac tromethamine]  Family History  Problem Relation Age of Onset  . CAD Mother   . CAD Father     Social History Social History   Tobacco Use  . Smoking status: Current Some Day Smoker    Packs/day: 0.25    Years: 10.00    Pack years: 2.50    Types: Cigarettes  . Smokeless tobacco: Never  Used  Substance Use Topics  . Alcohol use: No  . Drug use: No    Review of Systems  Constitutional: No fever. Eyes: No redness. ENT: No sore throat. Cardiovascular: Denies chest pain. Respiratory: Denies shortness of breath. Gastrointestinal: Positive for nausea.  No diarrhea..  Genitourinary: Negative for flank pain.  Musculoskeletal: Negative for back pain. Skin: Negative for rash. Neurological: Negative for headache.   ____________________________________________   PHYSICAL EXAM:  VITAL SIGNS: ED Triage Vitals  Enc Vitals Group     BP 05/14/18 1950 (!) 129/58     Pulse Rate 05/14/18 1950 96     Resp 05/14/18 1950 18     Temp 05/14/18 1950 98.8 F (37.1 C)     Temp Source 05/14/18 1950 Oral     SpO2 05/14/18 1950 97 %     Weight 05/14/18 1950 106 lb (48.1 kg)     Height 05/14/18 1950 5\' 7"  (1.702 m)     Head Circumference --      Peak Flow --      Pain Score 05/14/18 1949 8     Pain Loc --      Pain Edu? --      Excl. in [place]? --     Constitutional: Alert and oriented.  Uncomfortable appearing but in no acute distress. Eyes: Conjunctivae are normal.  No scleral icterus. Head: Atraumatic. Nose: No congestion/rhinnorhea. Mouth/Throat: Mucous membranes are moist.   Neck: Normal range of motion.  Cardiovascular:  Good peripheral circulation. Respiratory: Normal respiratory effort.  No retractions. Gastrointestinal: Soft with moderate epigastric tenderness. No distention.  Genitourinary: No flank tenderness. Musculoskeletal:  Extremities warm and well perfused.  Neurologic:  Normal speech and language. No gross focal neurologic deficits are appreciated.  Skin:  Skin is warm and dry. No rash noted. Psychiatric: Mood and affect are normal. Speech and behavior are normal.  ____________________________________________   LABS (all labs ordered are listed, but only abnormal results are displayed)  Labs Reviewed  LIPASE, BLOOD - Abnormal; Notable for the  following components:      Result Value   Lipase 6,383 (*)    All other components within normal limits  COMPREHENSIVE METABOLIC PANEL - Abnormal; Notable for the following components:   Glucose, Bld 108 (*)    GFR calc non Af Amer 59 (*)    All other components within normal limits  CBC - Abnormal; Notable for the following components:   Hemoglobin 11.0 (*)    HCT 32.8 (*)    RDW 15.5 (*)    All other components within normal limits  URINALYSIS, COMPLETE (UACMP) WITH MICROSCOPIC - Abnormal; Notable for the following components:   Color, Urine YELLOW (*)    APPearance HAZY (*)    Leukocytes, UA MODERATE (*)    Bacteria, UA RARE (*)    Non Squamous Epithelial PRESENT (*)    All other components within normal limits  TROPONIN I   ____________________________________________  EKG   ____________________________________________  RADIOLOGY  CT abdomen: Acute pancreatitis  ____________________________________________   PROCEDURES  Procedure(s) performed: No  Procedures  Critical Care performed: No ____________________________________________   INITIAL IMPRESSION / ASSESSMENT AND PLAN / ED COURSE  Pertinent labs & imaging results that were available during my care of the patient were reviewed by me and considered in my medical decision making (see chart for details).  62 year old female with PMH as noted above presents with epigastric pain and nausea after an endoscopic biopsy performed today.  Patient was referred to the emergency department primarily to rule out pancreatitis.  I reviewed the past medical records in Epic and confirmed the history of endoscopic biopsy performed by Dr. Lysle Rubens at [facility] today.  It was apparently uncomplicated and the patient was discharged home.  On exam, the patient is uncomfortable but not acutely ill-appearing.  Her vital signs are normal.  The abdomen is soft with some epigastric tenderness.  Differential includes postprocedural pain,  acute pancreatitis, or less likely perforation or other procedural complication.  Plan: Analgesia, lab work-up, CT abdomen, and reassess.  ----------------------------------------- 11:33 PM on 05/14/2018 -----------------------------------------  CT and lab work-up are consistent with acute pancreatitis.  We will contact GI at [facility] to determine if the patient needs to be transferred there, or whether she will stay here for treatment.  I am signing the patient out to the oncoming physician Dr. Owens Shark. ____________________________________________   FINAL CLINICAL IMPRESSION(S) / ED DIAGNOSES  Final diagnoses:  Other acute pancreatitis, unspecified complication status      NEW MEDICATIONS STARTED DURING THIS VISIT:  New Prescriptions   No medications on file     Note:  This document was prepared using Dragon voice recognition software and may include unintentional dictation errors.    Arta Silence, MD 05/14/18 [phone]

## 2018-05-14 NOTE — ED Notes (Signed)
Pt to CT. ABCs intact. NAD 

## 2018-05-14 NOTE — ED Triage Notes (Addendum)
Patient ambulatory to triage with steady gait, without difficulty or distress noted; pt reports endoscopic biopsy today (for cyst found on pancreas); sent over by Dr Tobe Sos [facility]) for concerns over "inflammation" due to pt's c/o left sided abd pain hr PTA with no accomp symptoms

## 2018-05-14 NOTE — ED Notes (Signed)
MD to bedside.

## 2018-05-15 MED ORDER — LORAZEPAM 2 MG/ML IJ SOLN
INTRAMUSCULAR | Status: AC
  Administered 2018-05-15: 0.5 mg
  Filled 2018-05-15: qty 1

## 2018-05-15 NOTE — ED Notes (Signed)
Medical Necessity and EMTALA documentation reviewed at this time and noted to be complete per policy.

## 2018-05-15 NOTE — ED Provider Notes (Signed)
I assumed care of the patient from Dr. Cherylann Banas at 11:30 PM.  Patient discussed with Dr. Cleophas Dunker gastroenterologist secondary to pancreatitis with lipase exceeding 6000 T scan showing fluid in the left pericolic gutter with findings consistent with acute pancreatitis.  Patient was accepted by Dr. Okey Dupre in transfer ED to ED.  Patient subsequently discussed with Dr. Lucious Groves ED physician who accepted the patient at [facility]   Gregor Hams, MD 05/15/18 [phone]

## 2018-07-07 DEATH — deceased

## 2019-07-01 IMAGING — CT CT HEAD W/O CM
3 of 7 series · 15 of 47 positions shown, 18 images · non-contrast
Comparison: 05/02/2017

CLINICAL DATA: Fell 3 days ago because of dizziness. Fell again
last night. Right facial bruising.

EXAM:
CT HEAD WITHOUT CONTRAST
CT MAXILLOFACIAL WITHOUT CONTRAST
TECHNIQUE: Multidetector CT imaging of the head and maxillofacial structures
were performed using the standard protocol without intravenous
contrast. Multiplanar CT image reconstructions of the maxillofacial
structures were also generated.

[Series 6: max soft · axial · 0.33mm/px · z∈[-255,-147]mm · 10 of 66 slices shown, 13 images]
[im 6/66  brain]
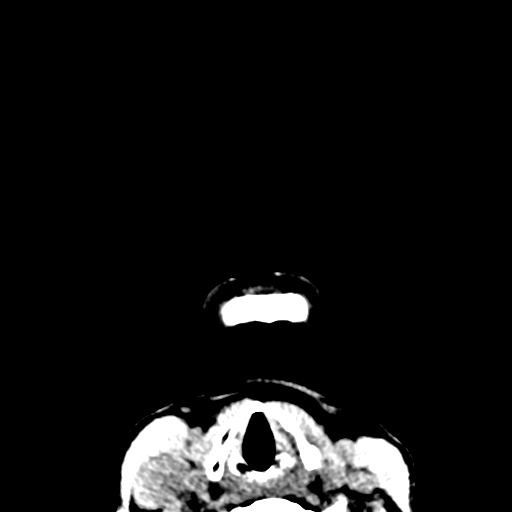
[im 6/66  bone]
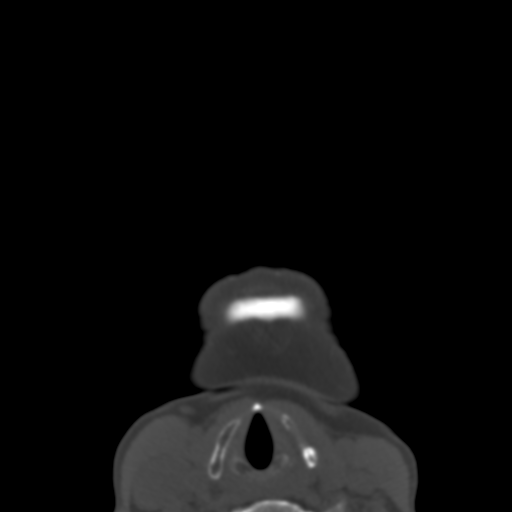
[im 12/66  brain]
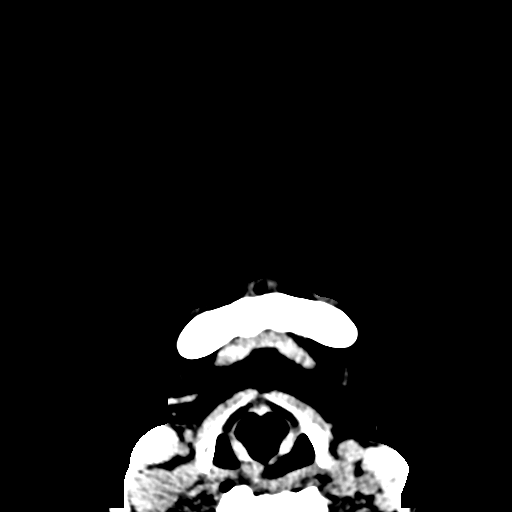
[im 18/66  brain]
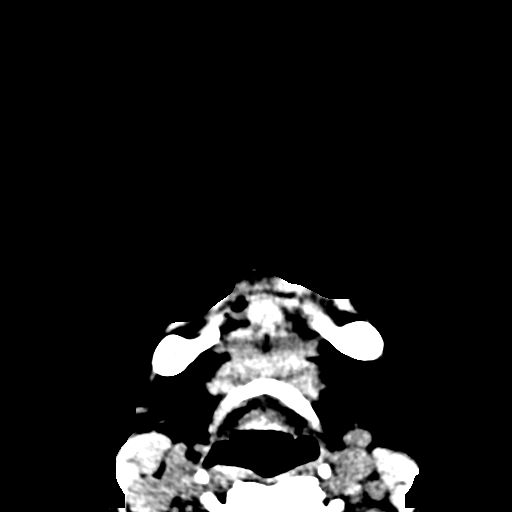
[im 24/66  brain]
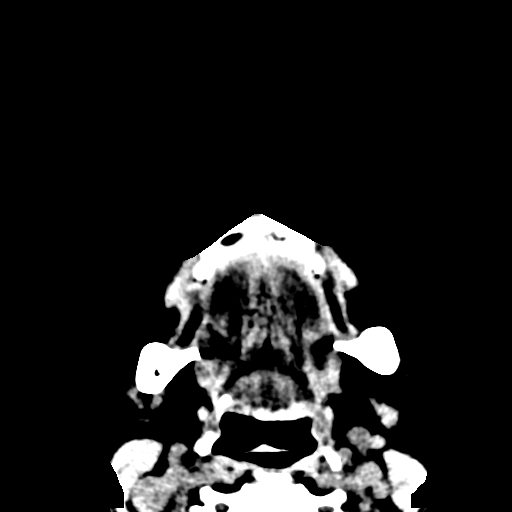
[im 30/66  brain]
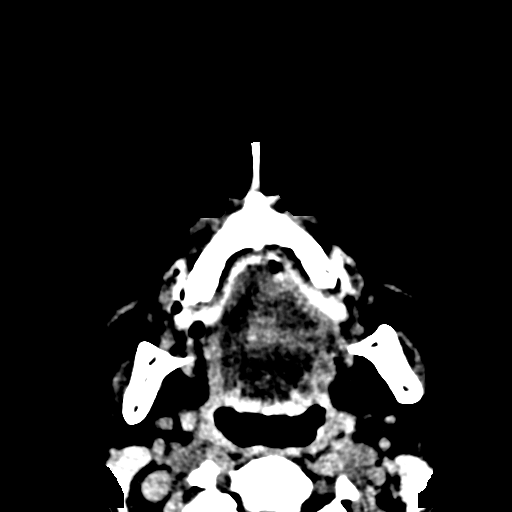
[im 30/66  bone]
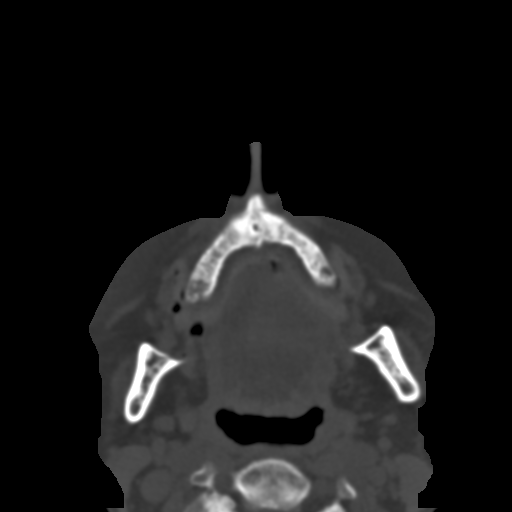
[im 36/66  brain]
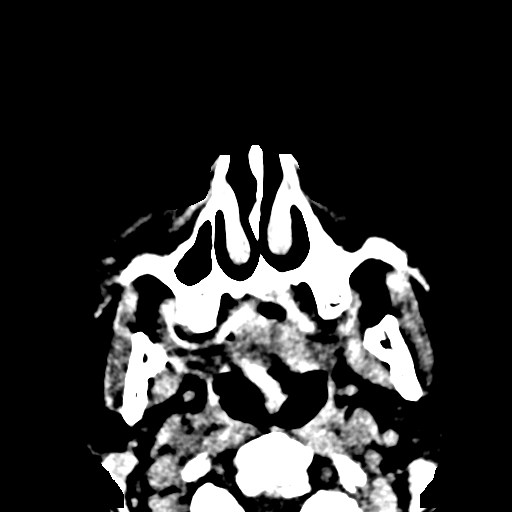
[im 42/66  brain]
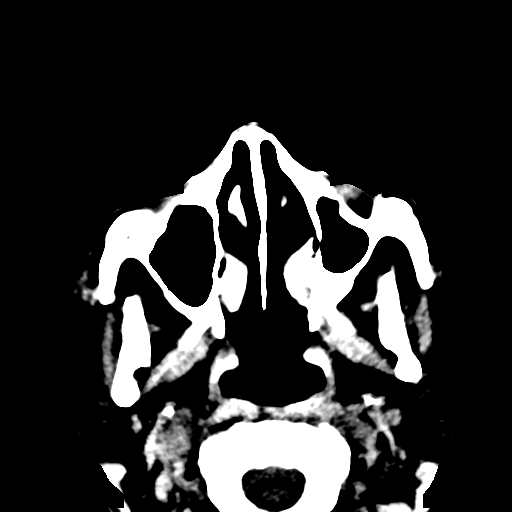
[im 48/66  brain]
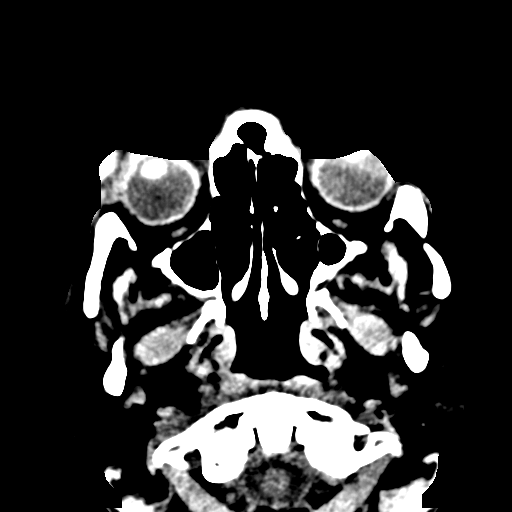
[im 54/66  brain]
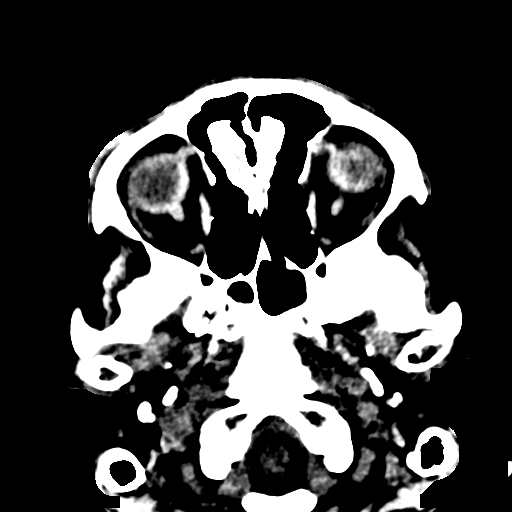
[im 54/66  bone]
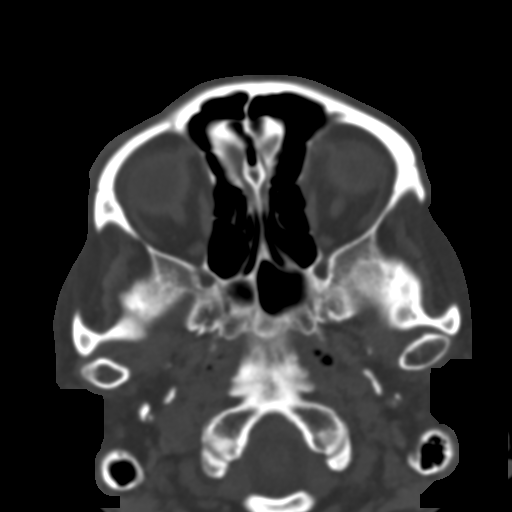
[im 60/66  brain]
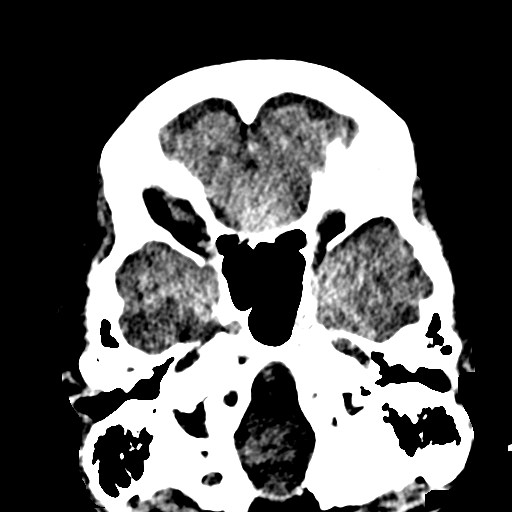

[Series 10: coronal soft · coronal · 0.30mm/px · 3 of 64 slices shown]
[im 41/64  brain]
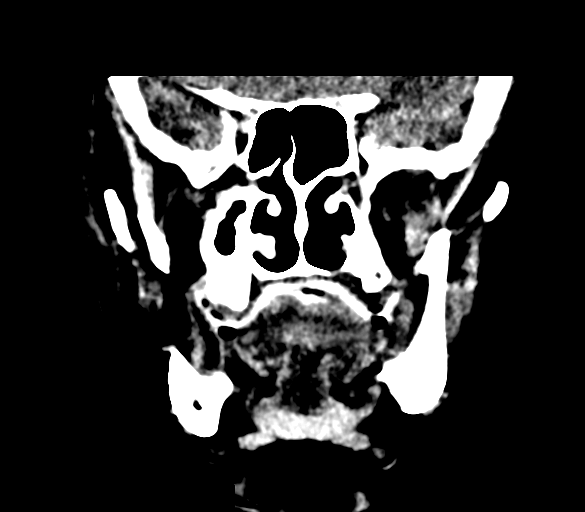
[im 46/64  brain]
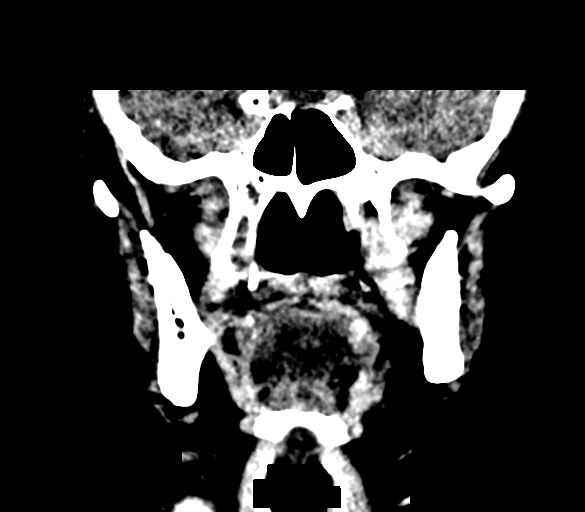
[im 50/64  brain]
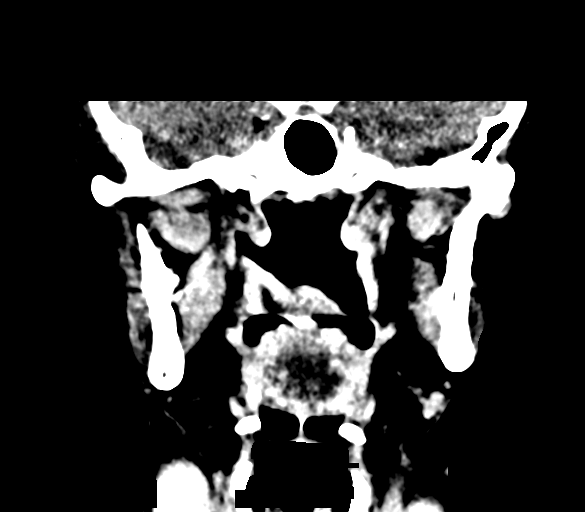

[Series 11: sagittal soft · sagittal · 0.31mm/px · 2 of 70 slices shown]
[im 24/70  brain]
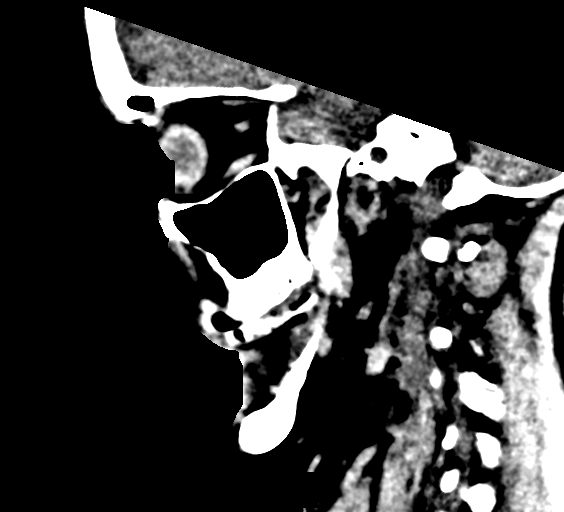
[im 47/70  brain]
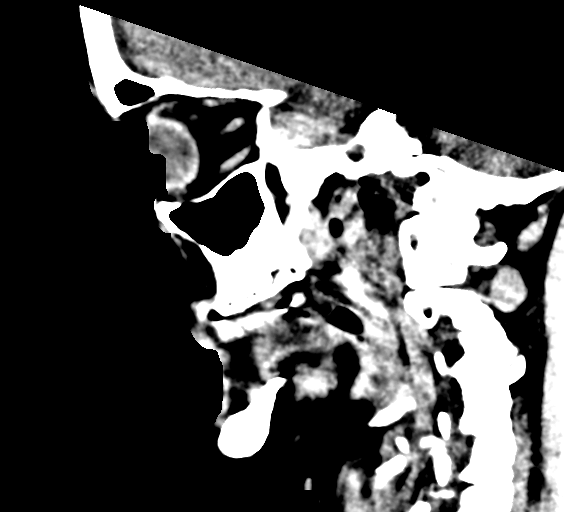

[15 of 47 positions shown; findings below may reference images not displayed]

FINDINGS: CT HEAD FINDINGS

Brain: The brain does not show accelerated atrophy. There is no
evidence of old or acute focal infarction, mass lesion, hemorrhage,
hydrocephalus or extra-axial collection.

Vascular: There is atherosclerotic calcification of the major
vessels at the base of the brain.

Skull: No skull fracture

Other: No fluid in the visualized sinuses, middle ears or mastoids.

CT MAXILLOFACIAL FINDINGS

Osseous: No facial fracture.

Orbits: No intraorbital injury.

Sinuses: Negative

Soft tissues: Focal superficial soft tissue hematoma measuring
cm in diameter in the right supraorbital region
IMPRESSION: Head CT:  Negative and normal for age.

Maxillofacial CT: No fracture. Superficial soft tissue hematoma
right supraorbital region. No evidence of intraorbital injury.
